# Patient Record
Sex: Male | Born: 1970 | Race: Black or African American | Hispanic: No | Marital: Married | Smoking: Never smoker
Health system: Southern US, Community
[De-identification: ages and names within clinical notes are randomized; demographics above are authoritative.]

## PROBLEM LIST (undated history)

## (undated) DIAGNOSIS — Z8719 Personal history of other diseases of the digestive system: Secondary | ICD-10-CM

## (undated) DIAGNOSIS — R7989 Other specified abnormal findings of blood chemistry: Secondary | ICD-10-CM

## (undated) DIAGNOSIS — G4710 Hypersomnia, unspecified: Secondary | ICD-10-CM

## (undated) DIAGNOSIS — M1990 Unspecified osteoarthritis, unspecified site: Secondary | ICD-10-CM

## (undated) DIAGNOSIS — Z8639 Personal history of other endocrine, nutritional and metabolic disease: Secondary | ICD-10-CM

## (undated) HISTORY — PX: SHOULDER SURGERY: SHX246

## (undated) HISTORY — PX: KNEE SURGERY: SHX244

---

## 1999-08-21 DIAGNOSIS — S4992XA Unspecified injury of left shoulder and upper arm, initial encounter: Secondary | ICD-10-CM

## 1999-08-21 HISTORY — DX: Unspecified injury of left shoulder and upper arm, initial encounter: S49.92XA

## 1999-12-06 ENCOUNTER — Emergency Department (HOSPITAL_COMMUNITY): Admission: EM | Admit: 1999-12-06 | Discharge: 1999-12-06 | Payer: Self-pay | Admitting: Emergency Medicine

## 1999-12-07 ENCOUNTER — Encounter: Payer: Self-pay | Admitting: Emergency Medicine

## 2000-08-07 ENCOUNTER — Ambulatory Visit (HOSPITAL_BASED_OUTPATIENT_CLINIC_OR_DEPARTMENT_OTHER): Admission: RE | Admit: 2000-08-07 | Discharge: 2000-08-07 | Payer: Self-pay | Admitting: Orthopedic Surgery

## 2003-10-15 ENCOUNTER — Ambulatory Visit (HOSPITAL_COMMUNITY): Admission: RE | Admit: 2003-10-15 | Discharge: 2003-10-15 | Payer: Self-pay | Admitting: Family Medicine

## 2006-12-05 ENCOUNTER — Emergency Department (HOSPITAL_COMMUNITY): Admission: EM | Admit: 2006-12-05 | Discharge: 2006-12-05 | Payer: Self-pay | Admitting: Emergency Medicine

## 2007-09-08 ENCOUNTER — Encounter: Admission: RE | Admit: 2007-09-08 | Discharge: 2007-09-08 | Payer: Self-pay | Admitting: Gastroenterology

## 2008-04-16 ENCOUNTER — Emergency Department (HOSPITAL_COMMUNITY): Admission: EM | Admit: 2008-04-16 | Discharge: 2008-04-16 | Payer: Self-pay | Admitting: Emergency Medicine

## 2011-01-05 NOTE — Op Note (Signed)
[place]  Patient:    Roger, Pace                    MRN: [id number] Proc. Date: 08/07/00 Adm. Date:  [id number] Attending:  Colbert Ewing                           Operative Report  PREOPERATIVE DIAGNOSIS:  Anterior dislocation with persistent instability, left shoulder.  POSTOPERATIVE DIAGNOSIS:  Anterior dislocation with persistent instability, left shoulder with capsular stretch injury.  Osteochondral injury of posterior humeral head with numerous intra-articular chondral loose bodies and an osteochondral loose body retrieved from the biceps tendon sheath.  Tearing anterior labrum.  OPERATION PERFORMED:  Left shoulder examination under anesthesia, arthroscopy with chondroplasty, humeral head.  Removal of numerous chondral loose bodies, debridement of labrum and assessment of pathology.  Open anteroinferior capsular shift to restabilize the shoulder.  Retrieval of osteochondral loose body from biceps tendon sheath.  SURGEON:  Loreta Ave, M.D.  ASSISTANT:  Arlys John D. Petrarca, P.A.-C.  ANESTHESIA:  General.  ESTIMATED BLOOD LOSS:  Minimal.  SPECIMENS:  None.  CULTURES:  None.  COMPLICATIONS:  None.  DRESSING:  Soft compressive.  DESCRIPTION OF PROCEDURE:  The patient was brought to the operating room and placed on the operating table in supine position.  After adequate anesthesia had been obtained, left shoulder examined.  Some gritting and crepitus with motion.  Increased external rotation and documented anterior instability. Stable posteriorly.  Full passive motion.  Placed in a beach chair position on the shoulder positioners, prepped and draped in the usual sterile fashion. Anterior posterior arthroscopic portals.  Shoulder entered with blunt obturator, distended and inspected.  Evidence of an anterior dislocation pattern with an osteochondral injury, posterior humeral head greater than 1 cm in diameter  with marked flaps of chondral fragmentation and chondral loose bodies found throughout the shoulder.  All removed.  Margins of the chondral injury debrided to a stable surface.  Tearing anterior labrum, middle portion. This was debrided to a stable surface.  No significant Bankart lesion but a capsular stretch injury was present.  Rotator cuff, biceps tendon, biceps anchor intact.  Once pathology had been confirmed, loose bodies removed and chrondroplasty complete.  Instruments and fluid removed.  Anterior incision from coracoid to anterior axillary fold.  Deltopectoral interval bluntly opened and the mini-Charnley retractor put in place.  The biceps tendon sheath was opened just inferior to the humeral head and within the tendon sheath the loose body that had been seen on scan was retrieved and removed and was osteochondral.  The subscap was then taken down and retracted medially leaving a small portion of the subscap on the front of the capsule to further thicken it.  The capsule was then cut along the humeral attachment from 12 to 6 oclock.  Interval tearing at the top was repaired with nonabsorbable Ethibond.  The capsule was then cut from the midportion of the humerus over to the glenoid.  Capsulorrhaphy performed bringing the inferior leaflet up to the 12 oclock position and the superior leaflet down to the 6 oclock position and then suturing this in place with multiple nonabsorbable sutures.  Markedly improved stability but maintained good motion of the shoulder.  Subscap was repaired anatomically with #2 Ethibond.  Wound irrigated.  Deltopectoral interval left closed.  Incision closed with subcutaneous and subcuticular Vicryl.  Steri-Strips applied.  Margins of the wound  injected with Marcaine. Portals closed with nylon and injected with Marcaine.  Sterile compressive dressing and shoulder immobilizer applied.  Anesthesia reversed.  Brought to recovery room.  Tolerated surgery well.   No complications. DD:  08/07/00 TD:  08/08/00 Job: [id number]

## 2012-10-09 ENCOUNTER — Other Ambulatory Visit: Payer: Self-pay | Admitting: Family Medicine

## 2012-10-09 DIAGNOSIS — N63 Unspecified lump in unspecified breast: Secondary | ICD-10-CM

## 2012-10-16 ENCOUNTER — Ambulatory Visit
Admission: RE | Admit: 2012-10-16 | Discharge: 2012-10-16 | Disposition: A | Discharge status: Home / Self Care | Payer: 59 | Source: Ambulatory Visit | Attending: Family Medicine | Admitting: Family Medicine

## 2012-10-16 DIAGNOSIS — N63 Unspecified lump in unspecified breast: Secondary | ICD-10-CM

## 2012-11-07 ENCOUNTER — Ambulatory Visit (HOSPITAL_BASED_OUTPATIENT_CLINIC_OR_DEPARTMENT_OTHER): Payer: 59 | Attending: Family Medicine

## 2012-11-07 VITALS — Ht 71.75 in | Wt 257.0 lb

## 2012-11-07 DIAGNOSIS — R0989 Other specified symptoms and signs involving the circulatory and respiratory systems: Secondary | ICD-10-CM | POA: Insufficient documentation

## 2012-11-07 DIAGNOSIS — R0609 Other forms of dyspnea: Secondary | ICD-10-CM | POA: Insufficient documentation

## 2012-11-07 DIAGNOSIS — G4730 Sleep apnea, unspecified: Secondary | ICD-10-CM | POA: Insufficient documentation

## 2012-11-07 DIAGNOSIS — G4710 Hypersomnia, unspecified: Secondary | ICD-10-CM | POA: Insufficient documentation

## 2012-11-07 DIAGNOSIS — G4733 Obstructive sleep apnea (adult) (pediatric): Secondary | ICD-10-CM

## 2012-11-15 DIAGNOSIS — R0609 Other forms of dyspnea: Secondary | ICD-10-CM

## 2012-11-15 DIAGNOSIS — G4730 Sleep apnea, unspecified: Secondary | ICD-10-CM

## 2012-11-15 DIAGNOSIS — R0989 Other specified symptoms and signs involving the circulatory and respiratory systems: Secondary | ICD-10-CM

## 2012-11-15 DIAGNOSIS — G4710 Hypersomnia, unspecified: Secondary | ICD-10-CM

## 2012-11-15 NOTE — Procedures (Signed)
Roger Pace, Roger Pace             ACCOUNT NO.:  [id number]  MEDICAL RECORD NO.:  [id number]          PATIENT TYPE:  OUT  LOCATION:  SLEEP CENTER                 FACILITY:  [facility]  PHYSICIAN:  Arnav Cregg D. Maple Hudson, MD, FCCP, FACPDATE OF BIRTH:  03-Sep-1969  DATE OF STUDY:  11/07/2012                           NOCTURNAL POLYSOMNOGRAM  REFERRING PHYSICIAN:  Lillia Carmel, M.D.  INDICATION FOR STUDY:  Hypersomnia with sleep apnea.  EPWORTH SLEEPINESS SCORE:  14/24.  BMI 34.9, weight 255 pounds, height 71.7 inches, neck 19.5 inches.  MEDICATIONS:  Home medications charted and reviewed.  SLEEP ARCHITECTURE:  Total sleep time 355 minutes with sleep efficiency 95%.  Stage I 2.7%, stage II 74.8%, stage III 7.5%, REM 15.1% of total sleep time.  Sleep latency 3.5 minutes, REM latency 79 minutes.  Awake after sleep onset 15.5 minutes.  Arousal index 8.6.  BEDTIME MEDICATION:  None.  RESPIRATORY DATA:  Apnea-hypopnea index (AHI) 1 per hour.  A total of 6 events was scored, all was hypopneas associated with supine sleep position and REM.  REM AHI 5.6 per hour.  OXYGEN DATA:  Moderate snoring with oxygen desaturation to a nadir of 89% and mean oxygen saturation through the study of 93.6% on room air.  CARDIAC DATA:  Normal sinus rhythm.  MOVEMENT-PARASOMNIA:  No significant movement disturbance.  No bathroom trips.  IMPRESSIONS-RECOMMENDATIONS: 1. Unremarkable sleep architecture without medication. 2. Occasional respiratory event with sleep disturbance, within normal     limits.  AHI 1 per hour (the normal range for adults is from 0-5     events per hour).  Moderate snoring with oxygen desaturation to a     nadir of 89% and mean oxygen saturation through the study of 93.6%     on room air. 3. He describes a rotating shift requirement and he estimates it costs     him 2 days sleep per week.  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     Jaeden Messer D. Maple Hudson, MD,  [facility], FACP Diplomate, American Board of Sleep Medicine    CDY/MEDQ  D:  11/15/2012 09:35:45  T:  11/15/2012 10:20:21  Job:  [id number]

## 2013-06-10 ENCOUNTER — Other Ambulatory Visit: Payer: Self-pay | Admitting: Orthopaedic Surgery

## 2013-06-10 DIAGNOSIS — M542 Cervicalgia: Secondary | ICD-10-CM

## 2013-06-10 DIAGNOSIS — R531 Weakness: Secondary | ICD-10-CM

## 2013-06-19 ENCOUNTER — Ambulatory Visit
Admission: RE | Admit: 2013-06-19 | Discharge: 2013-06-19 | Disposition: A | Discharge status: Home / Self Care | Payer: 59 | Source: Ambulatory Visit | Attending: Orthopaedic Surgery | Admitting: Orthopaedic Surgery

## 2013-06-19 DIAGNOSIS — M542 Cervicalgia: Secondary | ICD-10-CM

## 2013-06-19 DIAGNOSIS — R531 Weakness: Secondary | ICD-10-CM

## 2014-05-13 ENCOUNTER — Ambulatory Visit
Admission: RE | Admit: 2014-05-13 | Discharge: 2014-05-13 | Disposition: A | Discharge status: Home / Self Care | Payer: Worker's Compensation | Source: Ambulatory Visit | Attending: Occupational Medicine | Admitting: Occupational Medicine

## 2014-05-13 ENCOUNTER — Other Ambulatory Visit: Payer: Self-pay | Admitting: Occupational Medicine

## 2014-05-13 DIAGNOSIS — M25521 Pain in right elbow: Secondary | ICD-10-CM

## 2014-05-13 DIAGNOSIS — T1490XA Injury, unspecified, initial encounter: Secondary | ICD-10-CM

## 2014-07-29 ENCOUNTER — Ambulatory Visit: Payer: 59 | Attending: Physician Assistant | Admitting: Physical Therapy

## 2014-07-29 DIAGNOSIS — M7652 Patellar tendinitis, left knee: Secondary | ICD-10-CM | POA: Diagnosis not present

## 2014-07-29 DIAGNOSIS — M7651 Patellar tendinitis, right knee: Secondary | ICD-10-CM | POA: Insufficient documentation

## 2014-07-30 ENCOUNTER — Encounter: Payer: Self-pay | Admitting: Physical Therapy

## 2014-07-30 NOTE — Therapy (Signed)
[facility] [address] [place], [place], [address] Phone: [phone]   Fax:  [phone]  Physical Therapy Evaluation  Patient Details  Name: Roger NuttingJulius A Prew MRN: [id number] Date of Birth: 05/27/1971  Encounter Date: 07/29/2014      PT End of Session - 07/30/14 1206    Visit Number 1   Number of Visits 8   Date for PT Re-Evaluation 09/09/14   PT Start Time 1512   PT Stop Time 1550   PT Time Calculation (min) 38 min      History reviewed. No pertinent past medical history.  History reviewed. No pertinent past surgical history.  There were no vitals taken for this visit.  Visit Diagnosis:  Patellar tendinitis of both knees - Plan: PT plan of care cert/re-cert      Subjective Assessment - 07/29/14 1512    Symptoms Taking Celebrex.  Complains of bilateral knee pain.   No recent injury.  States his problem is from wear and tear from high school football.  Reports the problem is worsening over time.  Went to the doctor to get a cortisone shot but the doctor did not want to do that "near his tendon" and referred him to PT instead for "patellar femoral tendinitis".   Right knee pain located distal thigh as well as peripatellar region and patellar tendon region.  Left knee pain located distal thigh as well but primarily medial joint line.   Pertinent History Right ACL reconstruction 1989, torn medial and lateral meniscus;  Left MCL tear and medial meniscus1988;  ruptured cervical disc;  left shoulder capsular shift 14 years.     Limitations Standing;Walking   Diagnostic tests Severe OA right knee; moderate OA left knee   Currently in Pain? 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  --  no   Is the patient reluctant to leave their home because of a fear of falling?  No   Home Environment   Living Enviornment Private residence   Prior Function   Vocation Full time employment   Vocation Requirements Works at [employer];  in/out of the Engineer, productioncar  Police officer;  Teaches at [employer]   Leisure --  Wants to get back to the gym   Observation/Other Assessments   Observations --  Palpable crepitus bilaterally;  left genu valgus   Skin Integrity mild  right knee swelling   Focus on Therapeutic Outcomes (FOTO)  --  not captured   AROM   Right Knee Extension 4   Right Knee Flexion 134   Left Knee Extension 0   Left Knee Flexion 134   Strength   Right Hip ABduction 4/5   Left Hip ABduction 3+/5   Right Knee Flexion 4/5   Right Knee Extension 4/5   Left Knee Flexion 4/5   Left Knee Extension 4/5   Flexibility   Soft Tissue Assessment /Muscle Lenght --  HS length bilaterally 80 degrees   Special Tests    Special Tests --  Decreased patellar mobility in all directions right > left   Step-up/Step Down   Side  Right;Left   Comments Pain and compensatory trunk lean and rotation;  discontinued right after 4 reps, left 7 reps              PT Short Term Goals - 07/30/14 1223    PT  SHORT TERM GOAL #1   Title "Independent with initial HEP   Time 3   Period Weeks   Status New   PT SHORT TERM GOAL #2   Title Knee flexion improved to 136 degrees bilaterally to provide greater ease getting in/out of the car.   Time 3   Period Weeks   Status New          PT Long Term Goals - 07/30/14 1224    PT LONG TERM GOAL #1   Title "Pt will be independent with advanced HEP.    Time 6   Period Weeks   Status New   PT LONG TERM GOAL #2   Title LEFS score improved by 5 points indicating improved function with less pain.   Time 6   Period Weeks   Status New   PT LONG TERM GOAL #3   Title Patient will have left hip abduction strength improved to 4/5 for improved standing and walking tolerance for work tasks.     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  Previous surgeries on both knee.  He reports he has been diagnosed with "severe OA" in right knee and "moderate " in the left knee .  He reports a worsening of pain over time especially in the quad muscle just above his kneecaps as well as around his kneecaps and left medial joint line.  He previously did some body building but has not worked out in [employer] gym since a cervical disc injury last year.  He hopes PT will help him return to the gym.  Bilateral knee crepitus.  Right knee AROM:  4-134, left 0-134.  Very painful with 6 inch step downs with multiple compensatory movements.  Bilateral LE strength grossly 4/5 except left hip abd 3+/5.     Pt will benefit from skilled therapeutic intervention in order to improve on the following deficits Pain;Decreased range of  motion;Decreased strength   Rehab Potential Good   PT Frequency 1x / week  PT Duration 6 weeks   PT Treatment/Interventions Electrical Stimulation;Therapeutic exercise;Neuromuscular re-education;Manual techniques;Dry needling;Patient/family education   PT Next Visit Plan LEFS since FOTO not captured;Bike or Nu-Step;  HS stretch;  patellar mobilization;  Leg press within pain limits;  glut med strengthening (clams with resist); quad stengthening within pain limits; ice or vasocompression                              Problem List There are no active problems to display for this patient.   Lavinia Sharps C 07/30/2014, 12:32 PM   Lavinia Sharps, PT 07/30/2014 12:33 PM Phone: [phone] Fax: [phone]

## 2014-08-05 ENCOUNTER — Ambulatory Visit: Payer: 59 | Admitting: Physical Therapy

## 2014-08-05 DIAGNOSIS — M7652 Patellar tendinitis, left knee: Principal | ICD-10-CM

## 2014-08-05 DIAGNOSIS — M7651 Patellar tendinitis, right knee: Secondary | ICD-10-CM

## 2014-08-05 NOTE — Therapy (Signed)
[facility] [address] [place], [place], [address] Phone: [phone]   Fax:  [phone]  Physical Therapy Treatment  Patient Details  Name: Roger Pace MRN: [id number] Date of Birth: 12/16/1970  Encounter Date: 08/05/2014      PT End of Session - 08/05/14 1311    Visit Number 2   Number of Visits 8   Date for PT Re-Evaluation 09/09/14   PT Start Time 1157   PT Stop Time 1235   PT Time Calculation (min) 38 min   Activity Tolerance Patient tolerated treatment well;Patient limited by pain      No past medical history on file.  No past surgical history on file.  There were no vitals taken for this visit.  Visit Diagnosis:  Patellar tendinitis of both knees      Subjective Assessment - 08/05/14 1202    Symptoms sore with season change. distal quads both   Currently in Pain? Yes   Pain Location Knee   Pain Orientation Right;Left   Pain Descriptors / Indicators Nagging   Pain Type Chronic pain   Pain Onset More than a month ago   Aggravating Factors  steps, change of weather   Pain Relieving Factors frozen peas   Multiple Pain Sites Yes   Pain Location Knee   Pain Orientation Left   Pain Descriptors / Indicators Aching   Pain Frequency Constant   Pain Onset On-going            [facility] Adult PT Treatment/Exercise - 08/05/14 1200    Knee/Hip Exercises: Stretches   Passive Hamstring Stretch 1 rep;30 seconds  with sheet, WNL   Quad Stretch 1 rep  Rt over edge of bed with manual stretches   Knee: Self-Stretch to increase Flexion --  Range Rt flexion 125, Lt 146 degrees   ITB Stretch 1 rep;10 seconds  both   Gastroc Stretch 1 rep;30 seconds  both on step   Knee/Hip Exercises: Standing   Heel Raises 2 sets  each leg   Step Down Left;10 reps;Right;5 reps  stopped Rt due to rubbing sound.   SLS with Vectors --  single leg each red band 4 way, standing knee20 degrees   Other Standing Knee Exercises --  hip  hinge 7 reps rod used for posture, painful Rt   Knee/Hip Exercises: Supine   Quad Sets 1 set;Both   Straight Leg Raises 1 set;Both  5  second holds , with cues   Patellar Mobs --  Rt only   Knee/Hip Exercises: Sidelying   Clams --  black band 10 reps encouraged hip position away from ribs.    Manual Therapy   Other Manual Therapy --  patellar mobilization rt            PT Short Term Goals - 08/05/14 1318    PT SHORT TERM GOAL #2   Title Knee flexion improved to 136 degrees bilaterally to provide greater ease getting in/out of the car.   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     Time 6   Period Weeks   Status On-going          Plan - 08/05/14 1313    Clinical Impression Statement Patient  flexible, tolerated exercises well considering all he has going on.                               Problem List There are no active problems to display for this patient. Roger Pace, PTA 08/05/2014 1:20 PM Phone: [phone] Fax: [phone]   Roger Pace,Roger Pace 08/05/2014, 1:20 PM

## 2014-08-10 ENCOUNTER — Ambulatory Visit: Payer: 59 | Admitting: Physical Therapy

## 2014-08-10 DIAGNOSIS — M7651 Patellar tendinitis, right knee: Secondary | ICD-10-CM

## 2014-08-10 DIAGNOSIS — M7652 Patellar tendinitis, left knee: Secondary | ICD-10-CM | POA: Diagnosis not present

## 2014-08-10 NOTE — Therapy (Signed)
[place], [place], [address] Phone: [phone]   Fax:  [phone]  Physical Therapy Treatment  Patient Details  Name: Roger Pace MRN: [id number] Date of Birth: Oct 18, 1970  Encounter Date: 08/10/2014      PT End of Session - 08/10/14 1650    Visit Number 3   Number of Visits 8   Date for PT Re-Evaluation 09/09/14   PT Start Time 4174   PT Stop Time 1650   PT Time Calculation (min) 65 min   Activity Tolerance Patient tolerated treatment well      No past medical history on file.  No past surgical history on file.  There were no vitals taken for this visit.  Visit Diagnosis:  Patellar tendinitis of both knees - Plan: PT plan of care cert/re-cert      Subjective Assessment - 08/10/14 1548    Symptoms Sore after getting out of a low car all weekend.  Step down and squat were painful last visit.     Limitations Standing;Walking   Currently in Pain? Yes   Pain Score 3    Pain Location Knee   Pain Orientation Left;Right   Pain Type Chronic pain   Pain Onset More than a month ago   Aggravating Factors  steps, walking on a hard floor at work   Pain Relieving Factors off feet                    [facility] Adult PT Treatment/Exercise - 08/10/14 1643    Knee/Hip Exercises: Machines for Strengthening   Total Gym Leg Press --  40# bilateral 20x, 20# right/left 20x each   Knee/Hip Exercises: Standing   Lateral Step Up Limitations --  4 inch lateral step up with opposite hip abd 20 right/left   SLS --  with red band UE diagonals 20x right and left   Moist Heat Therapy   Number Minutes Moist Heat 10 Minutes   Moist Heat Location --  Bilateral thighs/knees   Manual Therapy   Manual Therapy Myofascial release   Myofascial Release --  Soft tissue mob before and after dry needling VMO and VL B          Trigger Point Dry Needling - 08/10/14 1645    Consent Given? Yes   Education Handout Provided Yes   Muscles Treated Lower Body Quadriceps   Quadriceps Response Twitch response elicited              PT Education - 08/10/14 1650    Education provided Yes   Education Details dry needle info   Person(s) Educated Patient   Methods Explanation   Comprehension Verbalized understanding          PT Short Term Goals - 08/10/14 1642    PT SHORT TERM GOAL #1   Title "Independent with initial HEP   Time 3   Period Weeks   Status On-going   PT SHORT TERM GOAL #2   Title Knee flexion improved to 136 degrees bilaterally to provide greater ease getting in/out of the car.   Time 3   Period Weeks           PT Long Term Goals - 08/10/14 1641    PT LONG TERM GOAL #1   Title "Pt will be independent with advanced HEP.    Time 6   Period Weeks   Status On-going   PT LONG TERM GOAL #2   Title LEFS  score improved by 5 points indicating improved function with less pain.   Time 6   Period Weeks   Status On-going   PT LONG TERM GOAL #3   Title Patient will have left hip abduction strength improved to 4/5 for improved standing and walking tolerance for work tasks.     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     Time 6   Period Weeks   Status On-going               Plan - 08/10/14 1652    Clinical Impression Statement Therapist closely monitoring exercises and modifying as needed to keep pain at a minimal.  No goals met yet secondary to only 3rd visit.  Will request ionto order.   PT Next Visit Plan Request ionto order today, perform next visit if order signed;  assess response to dry needling;  strengthening within pain limits        Problem List There are no active problems to display for this patient.   Alvera Singh 08/10/2014, 5:02 PM  [facility] [address] [place], [place], [address] Phone: [phone]   Fax:  [phone]

## 2014-08-10 NOTE — Patient Instructions (Signed)

## 2014-08-17 ENCOUNTER — Ambulatory Visit: Payer: 59 | Admitting: Rehabilitation

## 2014-08-17 DIAGNOSIS — M7652 Patellar tendinitis, left knee: Principal | ICD-10-CM

## 2014-08-17 DIAGNOSIS — M7651 Patellar tendinitis, right knee: Secondary | ICD-10-CM

## 2014-08-17 NOTE — Patient Instructions (Signed)
Back Wall Slide   With feet ____ inches from wall, lean as much of back against the wall as possible. Gently squat down ___ inches, keeping back against wall. Hold __5__ seconds while counting out loud. Repeat __10-20__ times. Do __2__ sessions per day.  http://gt2.exer.us/563   Copyright  VHI. All rights reserved.   Abduction: Side Leg Lift (Eccentric) - Side-Lying   Lie on side. Lift top leg slightly higher than shoulder level. Keep top leg straight with body, toes pointing forward. Slowly lower for 3-5 seconds. __10_ reps per set, 2___ sets per day, _7__ days per week. Add ___ lbs when you achieve ___ repetitions.  Copyright  VHI. All rights reserved.  Strengthening: Straight Leg Raise (Phase 1)   Tighten muscles on front of right thigh, then lift leg __12__ inches from surface, keeping knee locked.  Repeat __10-20__ times per set. Do __2__ sets per session. Do __2__ sessions per day.  http://orth.exer.us/614   Copyright  VHI. All rights reserved.

## 2014-08-17 NOTE — Therapy (Signed)
[facility] [address] [place], [place], [address] Phone: [phone]   Fax:  [phone]  Physical Therapy Treatment  Patient Details  Name: Roger NuttingJulius A Pace MRN: [id number] Date of Birth: 05/13/1971  Encounter Date: 08/17/2014      PT End of Session - 08/17/14 1555    Visit Number 4   Number of Visits 8   Date for PT Re-Evaluation 09/09/14   PT Start Time 0305   PT Stop Time 0348   PT Time Calculation (min) 43 min      No past medical history on file.  No past surgical history on file.  There were no vitals taken for this visit.  Visit Diagnosis:  Patellar tendinitis of both knees      Subjective Assessment - 08/17/14 1531    Symptoms no pain now but have not been doing anything. Go back to work [employer]. I did not notice a change in pain after dry needing maybe becasue I worked all night afterwards.    Pertinent History Right ACL reconstruction 1989, torn medial and lateral meniscus;  Left MCL tear and medial meniscus1988;  ruptured cervical disc;  left shoulder capsular shift 14 years.     Currently in Pain? No/denies   Aggravating Factors  deep knee flexion, steps,    Pain Relieving Factors off feet   Multiple Pain Sites No                    [facility] Adult PT Treatment/Exercise - 08/17/14 1539    Knee/Hip Exercises: Stretches   Quad Stretch 3 reps;30 seconds   Gastroc Stretch 30 seconds;3 reps  heel hang   Knee/Hip Exercises: Standing   Wall Squat 10 reps;2 sets   Knee/Hip Exercises: Supine   Straight Leg Raises 2 sets;10 reps;Both;Strengthening  3#                PT Education - 08/17/14 1557    Education provided Yes   Education Details HEP   Person(s) Educated Patient   Methods Handout;Explanation   Comprehension Verbalized understanding          PT Short Term Goals - 08/10/14 1642    PT SHORT TERM GOAL #1   Title "Independent with initial HEP   Time 3   Period Weeks    Status On-going   PT SHORT TERM GOAL #2   Title Knee flexion improved to 136 degrees bilaterally to provide greater ease getting in/out of the car.   Time 3   Period Weeks           PT Long Term Goals - 08/10/14 1641    PT LONG TERM GOAL #1   Title "Pt will be independent with advanced HEP.    Time 6   Period Weeks   Status On-going   PT LONG TERM GOAL #2   Title LEFS score improved by 5 points indicating improved function with less pain.   Time 6   Period Weeks   Status On-going   PT LONG TERM GOAL #3   Title Patient will have left hip abduction strength improved to 4/5 for improved standing and walking tolerance for work tasks.     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     Time 6   Period Weeks  Status On-going               Plan - 08/17/14 1558    Clinical Impression Statement able to initiate hip strengthening HEP   PT Next Visit Plan call to ask about ionto, POC or ionto request not signed yet        Problem List There are no active problems to display for this patient.   Sherrie MustacheDonoho, Mylan Schwarz McGee, VirginiaPTA 08/17/2014, 4:02 PM  [facility] [address] [place], [place], [address] Phone: [phone]   Fax:  [phone]

## 2014-08-24 ENCOUNTER — Ambulatory Visit: Payer: 59 | Admitting: Rehabilitation

## 2014-09-02 ENCOUNTER — Encounter: Payer: Self-pay | Admitting: Physical Therapy

## 2014-12-09 DIAGNOSIS — S29011A Strain of muscle and tendon of front wall of thorax, initial encounter: Secondary | ICD-10-CM | POA: Insufficient documentation

## 2016-08-20 DIAGNOSIS — Z8781 Personal history of (healed) traumatic fracture: Secondary | ICD-10-CM

## 2016-08-20 HISTORY — DX: Personal history of (healed) traumatic fracture: Z87.81

## 2016-10-10 ENCOUNTER — Other Ambulatory Visit (INDEPENDENT_AMBULATORY_CARE_PROVIDER_SITE_OTHER): Payer: Self-pay | Admitting: Physician Assistant

## 2016-10-19 ENCOUNTER — Emergency Department (HOSPITAL_BASED_OUTPATIENT_CLINIC_OR_DEPARTMENT_OTHER)
Admission: EM | Admit: 2016-10-19 | Discharge: 2016-10-19 | Disposition: A | Discharge status: Home / Self Care | Payer: Worker's Compensation | Attending: Emergency Medicine | Admitting: Emergency Medicine

## 2016-10-19 ENCOUNTER — Emergency Department (HOSPITAL_BASED_OUTPATIENT_CLINIC_OR_DEPARTMENT_OTHER): Payer: Worker's Compensation

## 2016-10-19 ENCOUNTER — Encounter (HOSPITAL_BASED_OUTPATIENT_CLINIC_OR_DEPARTMENT_OTHER): Payer: Self-pay | Admitting: Emergency Medicine

## 2016-10-19 DIAGNOSIS — Y999 Unspecified external cause status: Secondary | ICD-10-CM | POA: Insufficient documentation

## 2016-10-19 DIAGNOSIS — X501XXA Overexertion from prolonged static or awkward postures, initial encounter: Secondary | ICD-10-CM | POA: Diagnosis not present

## 2016-10-19 DIAGNOSIS — S99921A Unspecified injury of right foot, initial encounter: Secondary | ICD-10-CM | POA: Diagnosis present

## 2016-10-19 DIAGNOSIS — Y939 Activity, unspecified: Secondary | ICD-10-CM | POA: Insufficient documentation

## 2016-10-19 DIAGNOSIS — Y929 Unspecified place or not applicable: Secondary | ICD-10-CM | POA: Insufficient documentation

## 2016-10-19 DIAGNOSIS — S92344A Nondisplaced fracture of fourth metatarsal bone, right foot, initial encounter for closed fracture: Secondary | ICD-10-CM | POA: Diagnosis not present

## 2016-10-19 NOTE — ED Notes (Signed)
Pt verbalizes understanding of d/c instructions and denies any further needs at this time. 

## 2016-10-19 NOTE — ED Triage Notes (Signed)
Patient states that he tripped and twisted his right ankle and now has right foot pain

## 2016-10-19 NOTE — ED Provider Notes (Signed)
[facility] Provider Note   CSN: [id number] Arrival date & time: 10/19/16  2104  By signing my name below, I, Modena JanskyAlbert Thayil, attest that this documentation has been prepared under the direction and in the presence of non-physician practitioner, Roxy Horsemanobert Hyla Coard, PA-C. Electronically Signed: Modena JanskyAlbert Thayil, Scribe. 10/19/2016. 10:51 PM.  History   Chief Complaint Chief Complaint  Patient presents with  . Fall   The history is provided by the patient. No language interpreter was used.   HPI Comments: Roger Pace is a 46 y.o. male who presents to the Emergency Department complaining of a fall that occurred today. He states he stepped in a hole and twisted his right ankle. He denies any LOC or head injury. He reports associated constant moderate right foot pain that is exacerbated by movement. He denies any other complaints.   History reviewed. No pertinent past medical history.  There are no active problems to display for this patient.   Past Surgical History:  Procedure Laterality Date  . KNEE SURGERY    . SHOULDER SURGERY         Home Medications    Prior to Admission medications   Medication Sig Start Date End Date Taking? Authorizing Provider  celecoxib (CELEBREX) 200 MG capsule TAKE 1 TO 2 CAPSULES BY MOUTH DAILY AS NEEDED 10/10/16   Kirtland BouchardGilbert W Clark, PA-C    Family History History reviewed. No pertinent family history.  Social History Social History  Substance Use Topics  . Smoking status: Never Smoker  . Smokeless tobacco: Never Used  . Alcohol use No     Allergies   Shellfish allergy   Review of Systems Review of Systems  Musculoskeletal: Positive for arthralgias and myalgias.  Neurological: Negative for syncope.     Physical Exam Updated Vital Signs BP (!) 172/102 (BP Location: Right Arm)   Pulse 62   Temp 97.8 F (36.6 C) (Oral)   Resp 16   Ht 5\' 11"  (1.803 m)   Wt 275 lb (124.7 kg)   SpO2 100%   BMI 38.35 kg/m   Physical  Exam  Nursing note and vitals reviewed.  Constitutional: Pt appears well-developed and well-nourished. No distress.  HENT:  Head: Normocephalic and atraumatic.  Eyes: Conjunctivae are normal.  Neck: Normal range of motion.  Cardiovascular: Normal rate, regular rhythm. Intact distal pulses.   Capillary refill < 3 sec.  Pulmonary/Chest: Effort normal and breath sounds normal.  Musculoskeletal:  Right Foot Pt exhibits tenderness to palpation over the midfoot, no ankle tenderness.   ROM: 5/5  Strength: 5/5  Neurological: Pt  is alert. Coordination normal.  Sensation: 5/5 Skin: Skin is warm and dry. Pt is not diaphoretic.  No evidence of open wound or skin tenting Psychiatric: Pt has a normal mood and affect.      ED Treatments / Results  DIAGNOSTIC STUDIES: Oxygen Saturation is 100% on RA, normal by my interpretation.    COORDINATION OF CARE: 10:55 PM- Pt advised of plan for treatment and pt agrees.  Labs (all labs ordered are listed, but only abnormal results are displayed) Labs Reviewed - No data to display  EKG  EKG Interpretation None       Radiology Dg Foot Complete Right  Result Date: 10/19/2016 CLINICAL DATA:  Stepped on downed branch and rolled right foot, with right foot pain and gait change. Initial encounter. EXAM: RIGHT FOOT COMPLETE - 3+ VIEW COMPARISON:  None. FINDINGS: There is a nondisplaced fracture through the proximal to mid fourth  metatarsal. No additional fractures are seen. A bipartite medial sesamoid of the first toe is noted. The joint spaces are preserved. There is no evidence of talar subluxation; the subtalar joint is unremarkable in appearance. No significant soft tissue abnormalities are seen. IMPRESSION: 1. Nondisplaced fracture through the proximal to mid fourth metatarsal. 2. Bipartite medial sesamoid of the first toe. Electronically Signed   By: Roanna Raider M.D.   On: 10/19/2016 22:05    Procedures Procedures (including critical care  time)  Medications Ordered in ED Medications - No data to display   Initial Impression / Assessment and Plan / ED Course  I have reviewed the triage vital signs and the nursing notes.  Pertinent labs & imaging results that were available during my care of the patient were reviewed by me and considered in my medical decision making (see chart for details).      Patient X-Ray as above. Pt advised to follow up with orthopedics if symptoms persist for possibility of missed fracture diagnosis. Patient given post-op shoe while in ED, conservative therapy recommended and discussed. Patient will be dc home & is agreeable with above plan.   Final Clinical Impressions(s) / ED Diagnoses   Final diagnoses:  Closed nondisplaced fracture of fourth metatarsal bone of right foot, initial encounter    New Prescriptions Discharge Medication List as of 10/19/2016 11:03 PM     I personally performed the services described in this documentation, which was scribed in my presence. The recorded information has been reviewed and is accurate.       Roxy Horseman, PA-C 10/19/16 2353    Canary Brim Tegeler, MD 10/20/16 [phone]

## 2017-03-06 ENCOUNTER — Ambulatory Visit (INDEPENDENT_AMBULATORY_CARE_PROVIDER_SITE_OTHER): Payer: 59 | Admitting: Physician Assistant

## 2017-03-06 ENCOUNTER — Encounter (INDEPENDENT_AMBULATORY_CARE_PROVIDER_SITE_OTHER): Payer: Self-pay | Admitting: Physician Assistant

## 2017-03-06 DIAGNOSIS — M7662 Achilles tendinitis, left leg: Secondary | ICD-10-CM | POA: Diagnosis not present

## 2017-03-06 MED ORDER — NITROGLYCERIN 0.2 MG/HR TD PT24: 0.2000 mg | MEDICATED_PATCH | Freq: Every day | TRANSDERMAL | 1 refills | Status: DC

## 2017-03-06 NOTE — Progress Notes (Signed)
   Office Visit Note   Patient: Roger Pace           Date of Birth: 08/07/1971           MRN: [id number] Visit Date: 03/06/2017              Requested by: No referring provider defined for this encounter. PCP: Merri BrunetteSmith, Candace, MD   Assessment & Plan: Visit Diagnoses:  1. Achilles tendinitis of left lower extremity     Plan: Nitropatch is to the Achilles tendon. Gastrocsoleus stretching exercises discussed. 9/16 heel lift in the left shoe. We'll see him back in 6 weeks' check his progress lack of.  Follow-Up Instructions: Return in about 4 weeks (around 04/03/2017).   Orders:  No orders of the defined types were placed in this encounter.  Meds ordered this encounter  Medications  . nitroGLYCERIN (NITRODUR - DOSED IN MG/24 HR) 0.2 mg/hr patch    Sig: Place 1 patch (0.2 mg total) onto the skin daily. Apply one patch to achilles tendon daily. If patient is unable to tolerate full patch ok to use half patch to achilles tendon, changing location slightly daily.    Dispense:  30 patch    Refill:  1    Apply patch near the effected area, change location daily      Procedures: No procedures performed   Clinical Data: No additional findings.   Subjective: Chief Complaint  Patient presents with  . Left Achilles Tendon - Pain    HPI Roger Pace is a [age] sexual male comes in today with Achilles pain and swelling. He states that the swelling pain began after he had to walk differently due to a right fourth mallet tarsal fracture which she sustained in March. He still in a boot is being treated by another provider for the metatarsal fracture. He has a history of Achilles tendinitis and uses nitroglycerin patches in the past. He's had no known injury to the left Achilles. Review of Systems  See history of present illness otherwise negative  Objective: Vital Signs: There were no vitals taken for this visit.  Physical Exam  Constitutional: He is oriented to person, place, and  time. He appears well-developed and well-nourished. No distress.  Cardiovascular: Intact distal pulses.   Pulmonary/Chest: Effort normal.  Neurological: He is alert and oriented to person, place, and time.  Skin: He is not diaphoretic.  Psychiatric: He has a normal mood and affect. His behavior is normal.    Ortho Exam Dorsal pedal pulse intact. Left constant tests negative. He does have thickening of the mid Achilles tendon but no deficit is palpable. There is no erythema, rashes skin lesions ulcerations. He is nontender at the Achilles insertion. Tight gastroc. Good range of motion the ankle otherwise without pain. Specialty Comments:  No specialty comments available.  Imaging: No results found.   PMFS History: There are no active problems to display for this patient.  No past medical history on file.  No family history on file.  Past Surgical History:  Procedure Laterality Date  . KNEE SURGERY    . SHOULDER SURGERY     Social History   Occupational History  . Not on file.   Social History Main Topics  . Smoking status: Never Smoker  . Smokeless tobacco: Never Used  . Alcohol use No  . Drug use: No  . Sexual activity: Not on file

## 2017-03-31 ENCOUNTER — Other Ambulatory Visit (INDEPENDENT_AMBULATORY_CARE_PROVIDER_SITE_OTHER): Payer: Self-pay | Admitting: Physician Assistant

## 2017-04-01 NOTE — Telephone Encounter (Signed)
Please advise 

## 2017-04-15 ENCOUNTER — Ambulatory Visit (INDEPENDENT_AMBULATORY_CARE_PROVIDER_SITE_OTHER): Payer: 59 | Admitting: Physician Assistant

## 2017-07-03 ENCOUNTER — Telehealth (INDEPENDENT_AMBULATORY_CARE_PROVIDER_SITE_OTHER): Payer: Self-pay | Admitting: Physician Assistant

## 2017-07-03 NOTE — Telephone Encounter (Signed)
I have pt scheduled for 07/22/2017. Patient wants the gel injections and asked if it would be possible to get shots for both knees. So if you could order those for me please, thanks!

## 2017-07-04 NOTE — Telephone Encounter (Signed)
Yes

## 2017-07-04 NOTE — Telephone Encounter (Signed)
Shoulder we see him first or just order Monovisc?

## 2017-07-04 NOTE — Telephone Encounter (Signed)
Ordered

## 2017-07-22 ENCOUNTER — Encounter (INDEPENDENT_AMBULATORY_CARE_PROVIDER_SITE_OTHER): Payer: Self-pay | Admitting: Physician Assistant

## 2017-07-22 ENCOUNTER — Ambulatory Visit (INDEPENDENT_AMBULATORY_CARE_PROVIDER_SITE_OTHER): Payer: 59 | Admitting: Physician Assistant

## 2017-07-22 VITALS — Ht 71.75 in | Wt 285.0 lb

## 2017-07-22 DIAGNOSIS — M1712 Unilateral primary osteoarthritis, left knee: Secondary | ICD-10-CM

## 2017-07-22 DIAGNOSIS — M1711 Unilateral primary osteoarthritis, right knee: Secondary | ICD-10-CM

## 2017-07-22 DIAGNOSIS — M170 Bilateral primary osteoarthritis of knee: Secondary | ICD-10-CM

## 2017-07-22 MED ORDER — HYALURONAN 88 MG/4ML IX SOSY
88.0000 mg | PREFILLED_SYRINGE | INTRA_ARTICULAR | Status: AC | PRN
  Administered 2017-07-22: 88 mg via INTRA_ARTICULAR

## 2017-07-22 NOTE — Progress Notes (Signed)
   Procedure Note  Patient: Roger NuttingJulius A Pace             Date of Birth: 02/25/1971           MRN: [id number]             Visit Date: 07/22/2017 HPI: [name] comes in today for injections both knees.  He has  known osteoarthritis both knees.  Supplemental injections and is requesting injections in both knees today.  He takes Celebrex for his knee pain.  He had no known injury to either knee. Review of systems: See HPI otherwise negative Physical exam: Bilateral knees is good range of motion of both knees no effusion abnormal warmth or erythema.  No instability with valgus varus stressing. Procedures: Visit Diagnoses: Primary osteoarthritis of both knees  Large Joint Inj: bilateral knee on 07/22/2017 8:31 AM Indications: pain Details: 22 G 1.5 in needle, anterolateral approach  Arthrogram: No  Medications (Right): 88 mg Hyaluronan 88 MG/4ML Medications (Left): 88 mg Hyaluronan 88 MG/4ML Outcome: tolerated well, no immediate complications Procedure, treatment alternatives, risks and benefits explained, specific risks discussed. Consent was given by the patient. Immediately prior to procedure a time out was called to verify the correct patient, procedure, equipment, support staff and site/side marked as required. Patient was prepped and draped in the usual sterile fashion.     Plan: He will follow-up on an as-needed basis pain persist or becomes worse.  Questions encouraged and answered at length

## 2017-11-18 ENCOUNTER — Other Ambulatory Visit (INDEPENDENT_AMBULATORY_CARE_PROVIDER_SITE_OTHER): Payer: Self-pay | Admitting: Physician Assistant

## 2017-11-18 NOTE — Telephone Encounter (Signed)
Please advise 

## 2018-03-01 ENCOUNTER — Other Ambulatory Visit (INDEPENDENT_AMBULATORY_CARE_PROVIDER_SITE_OTHER): Payer: Self-pay | Admitting: Physician Assistant

## 2018-03-03 NOTE — Telephone Encounter (Signed)
Please advise.  Last seen in December

## 2018-05-05 ENCOUNTER — Telehealth (INDEPENDENT_AMBULATORY_CARE_PROVIDER_SITE_OTHER): Payer: Self-pay | Admitting: Physician Assistant

## 2018-05-05 NOTE — Telephone Encounter (Signed)
Noted  

## 2018-05-05 NOTE — Telephone Encounter (Signed)
Patient walked in today, he states he would like to have bilateral knee gel injections. If this is ok, please submit for authorization. # [id number]

## 2018-05-08 IMAGING — CR DG FOOT COMPLETE 3+V*R*
3 series · 3 of 3 positions shown · non-contrast
Comparison: None.

CLINICAL DATA: Stepped on downed branch and rolled right foot, with
right foot pain and gait change. Initial encounter.

EXAM:
RIGHT FOOT COMPLETE - 3+ VIEW

[t foot ap right]
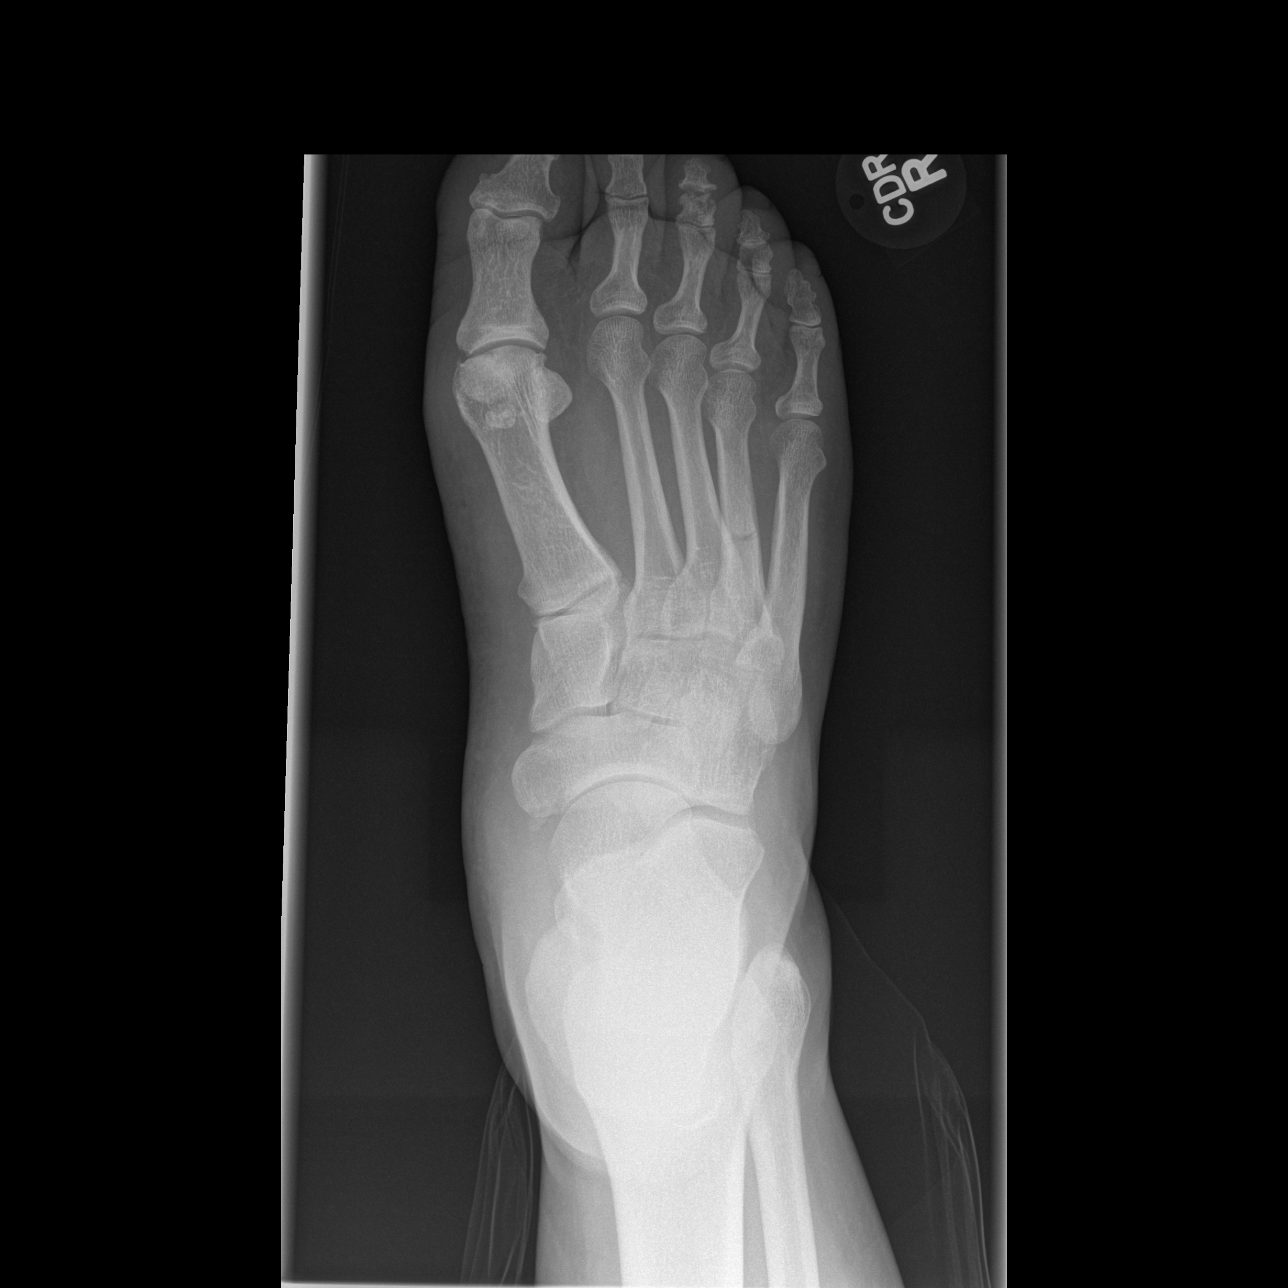

[t foot oblique right]
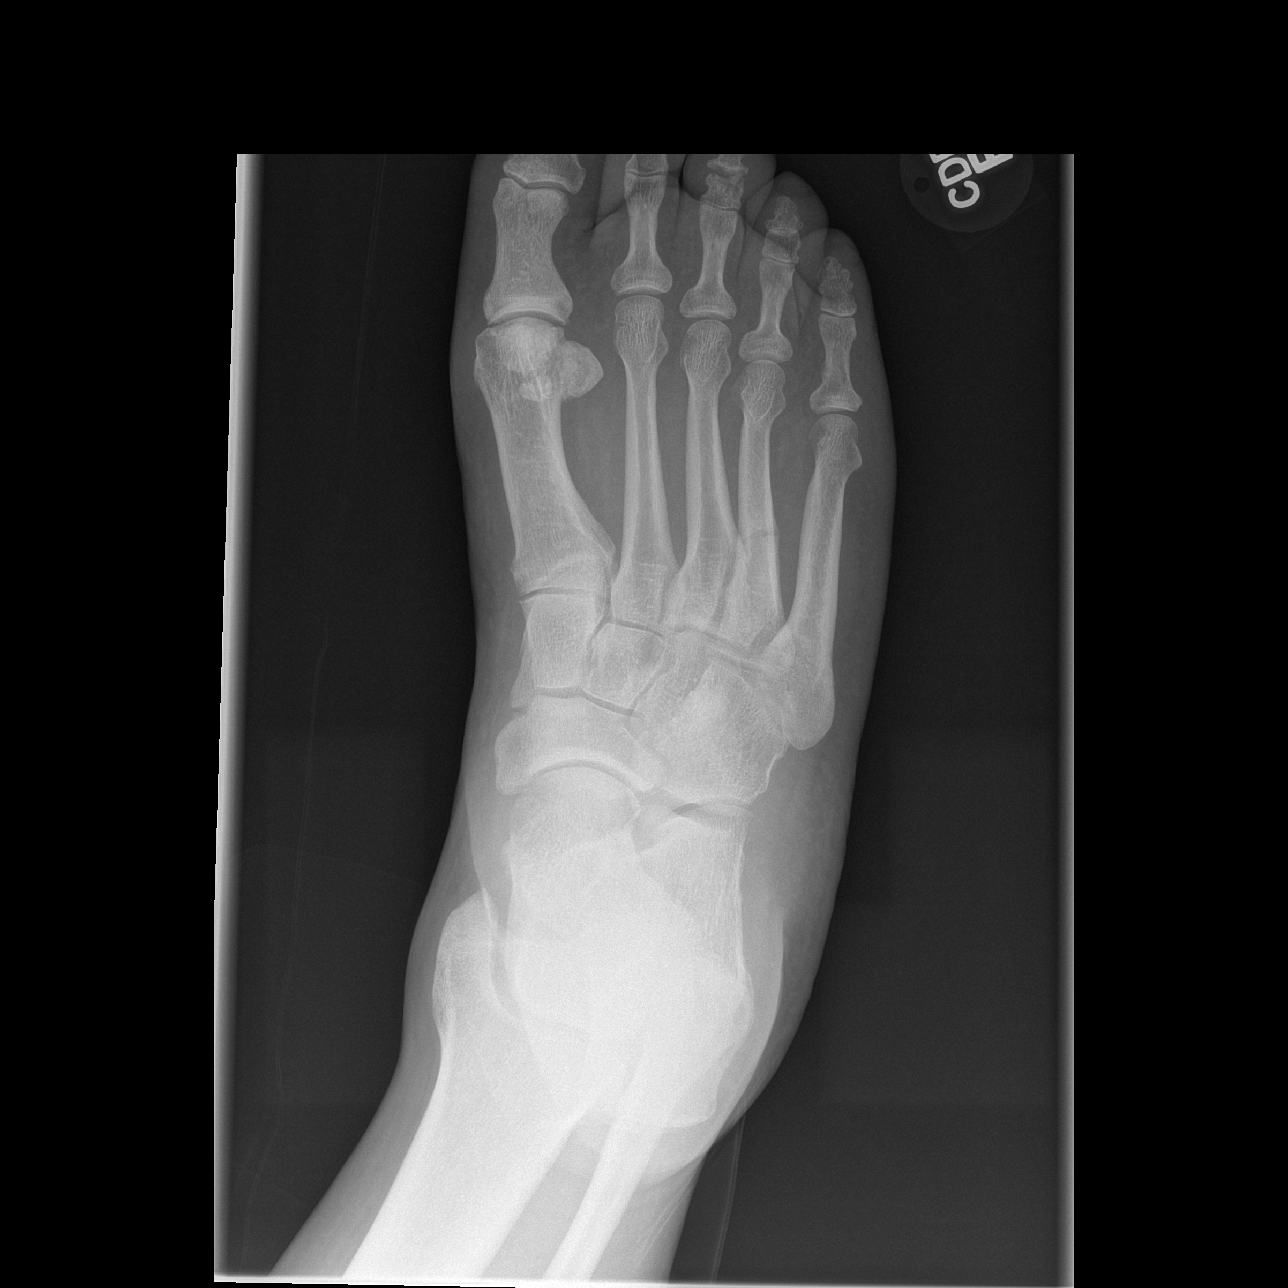

[t foot lat right]
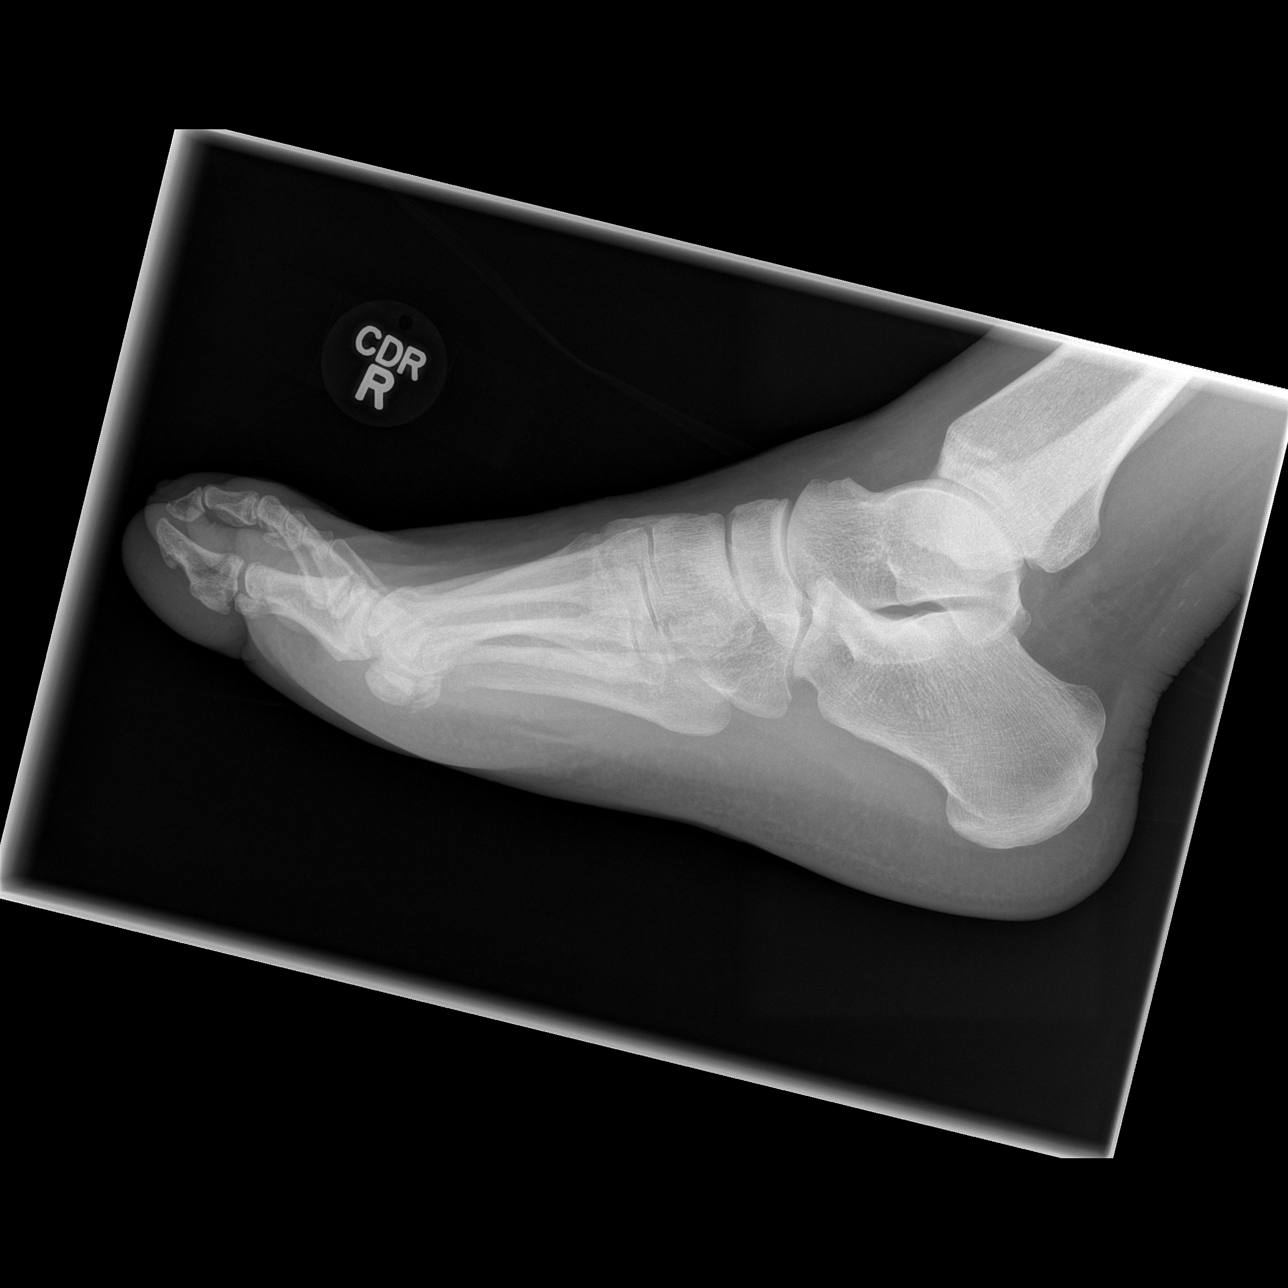

[3 of 3 positions shown; findings below may reference images not displayed]

FINDINGS: There is a nondisplaced fracture through the proximal to mid fourth
metatarsal. No additional fractures are seen.

A bipartite medial sesamoid of the first toe is noted. The joint
spaces are preserved. There is no evidence of talar subluxation; the
subtalar joint is unremarkable in appearance.

No significant soft tissue abnormalities are seen.
IMPRESSION: 1. Nondisplaced fracture through the proximal to mid fourth
metatarsal.
2. Bipartite medial sesamoid of the first toe.

## 2018-05-28 ENCOUNTER — Other Ambulatory Visit (INDEPENDENT_AMBULATORY_CARE_PROVIDER_SITE_OTHER): Payer: Self-pay | Admitting: Physician Assistant

## 2018-05-28 NOTE — Telephone Encounter (Signed)
Please advise 

## 2018-07-25 ENCOUNTER — Encounter (INDEPENDENT_AMBULATORY_CARE_PROVIDER_SITE_OTHER): Payer: Self-pay | Admitting: Family Medicine

## 2018-07-25 ENCOUNTER — Telehealth (INDEPENDENT_AMBULATORY_CARE_PROVIDER_SITE_OTHER): Payer: Self-pay | Admitting: Orthopaedic Surgery

## 2018-07-25 ENCOUNTER — Ambulatory Visit (INDEPENDENT_AMBULATORY_CARE_PROVIDER_SITE_OTHER): Payer: Self-pay

## 2018-07-25 ENCOUNTER — Ambulatory Visit (INDEPENDENT_AMBULATORY_CARE_PROVIDER_SITE_OTHER): Payer: 59 | Admitting: Family Medicine

## 2018-07-25 ENCOUNTER — Telehealth (INDEPENDENT_AMBULATORY_CARE_PROVIDER_SITE_OTHER): Payer: Self-pay

## 2018-07-25 VITALS — BP 159/75 | HR 75 | Ht 71.75 in | Wt 280.0 lb

## 2018-07-25 DIAGNOSIS — M25562 Pain in left knee: Secondary | ICD-10-CM

## 2018-07-25 DIAGNOSIS — M25561 Pain in right knee: Secondary | ICD-10-CM

## 2018-07-25 DIAGNOSIS — G8929 Other chronic pain: Secondary | ICD-10-CM

## 2018-07-25 NOTE — Telephone Encounter (Signed)
Patient would like to have a gel injection.  Will this be okay with Dr. Prince RomeHilts?  Please let me know.  Thank You

## 2018-07-25 NOTE — Telephone Encounter (Signed)
Gel injection request  °

## 2018-07-25 NOTE — Telephone Encounter (Signed)
This was handled at his appointment today.

## 2018-07-25 NOTE — Telephone Encounter (Signed)
Patient will come in today to see Dr.Hilts he saw him for years. Mr.Leider will probably continue care with Dr.Hilts not sure of how that works clinically.

## 2018-07-25 NOTE — Progress Notes (Signed)
   Office Visit Note   Patient: Roger NuttingJulius A Garrette           Date of Birth: 12/30/1970           MRN: [id number] Visit Date: 07/25/2018 Requested by: Merri BrunetteSmith, Candace, MD [phone] [place] [place], [place] [address] PCP: Merri BrunetteSmith, Candace, MD  Subjective: Chief Complaint  Patient presents with  . Left Knee - Pain  . Right Knee - Pain  . Knee Pain    using clebrex for pain , on going for a while     HPI: He is here with bilateral knee pain.  47 year old with known osteoarthritis in his knees.  Visco supplementation injections have given him some relief, last ones were in December 2018.  He would like to have them again if possible.  We have requested approval in September.  Due to various injuries, he is gained quite a bit of weight since he has been unable to exercise like he used to.  He is currently trying to lose weight again.  Eventually he would like to have his knees replaced, hopefully in a few years.                ROS: Otherwise noncontributory  Objective: Vital Signs: BP (!) 159/75   Pulse 75   Ht 5' 11.75" (1.822 m)   Wt 280 lb (127 kg)   BMI 38.24 kg/m   Physical Exam:  Knees: 2-3+ patellofemoral crepitus in both knees.  1+ effusion in both knees with no warmth or erythema.  Full extension, flexion of 125 degrees.  Slight tenderness medial joint line of each knee.  Imaging: None today.  He states that he has had some about a year ago.  Assessment & Plan: 1.  Chronic bilateral knee pain due to osteoarthritis - Will request approval for gel injections.     Follow-Up Instructions: No follow-ups on file.      Procedures: No procedures performed  No notes on file    PMFS History: There are no active problems to display for this patient.  History reviewed. No pertinent past medical history.  History reviewed. No pertinent family history.  Past Surgical History:  Procedure Laterality Date  . KNEE SURGERY    . SHOULDER SURGERY     Social History     Occupational History  . Not on file  Tobacco Use  . Smoking status: Never Smoker  . Smokeless tobacco: Never Used  Substance and Sexual Activity  . Alcohol use: No  . Drug use: No  . Sexual activity: Not on file

## 2018-07-25 NOTE — Telephone Encounter (Signed)
Submitted VOB for Durolane bilateral Knee.

## 2018-07-28 ENCOUNTER — Telehealth (INDEPENDENT_AMBULATORY_CARE_PROVIDER_SITE_OTHER): Payer: Self-pay

## 2018-07-28 NOTE — Telephone Encounter (Signed)
Faxed office notes to [phone] for VOB at [phone].

## 2018-08-07 ENCOUNTER — Telehealth (INDEPENDENT_AMBULATORY_CARE_PROVIDER_SITE_OTHER): Payer: Self-pay

## 2018-08-07 NOTE — Telephone Encounter (Signed)
Talked with patient and advised him that he is approved for gel injection.  Approved for Durolane, bilateral knee. Covered at 80% of the allowable amount after the deductible has been met. Patient will be responsible for 20% OOP. May have a Co-pay of $50.00?  Appt. 08/11/2018 with Dr. Junius Roads.

## 2018-08-11 ENCOUNTER — Ambulatory Visit (INDEPENDENT_AMBULATORY_CARE_PROVIDER_SITE_OTHER): Payer: 59 | Admitting: Family Medicine

## 2018-08-11 ENCOUNTER — Encounter (INDEPENDENT_AMBULATORY_CARE_PROVIDER_SITE_OTHER): Payer: Self-pay | Admitting: Family Medicine

## 2018-08-11 DIAGNOSIS — G8929 Other chronic pain: Secondary | ICD-10-CM | POA: Diagnosis not present

## 2018-08-11 DIAGNOSIS — M170 Bilateral primary osteoarthritis of knee: Secondary | ICD-10-CM | POA: Diagnosis not present

## 2018-08-11 DIAGNOSIS — M25562 Pain in left knee: Secondary | ICD-10-CM | POA: Diagnosis not present

## 2018-08-11 DIAGNOSIS — M25561 Pain in right knee: Secondary | ICD-10-CM | POA: Diagnosis not present

## 2018-08-11 NOTE — Progress Notes (Signed)
Subjective: He is here for planned Durolane injections for bilateral knee osteoarthritis.  Objective: 1+ effusion in both knees, no warmth or erythema.  Impression: Bilateral knee osteoarthritis  Procedure:  Durolane injections both knees: After sterile prep with Betadine, injected 3 cc 1% lidocaine without epinephrine and Durolane from superolateral approach, a flash of clear yellow synovial fluid was obtained prior to each injection.

## 2018-08-28 ENCOUNTER — Other Ambulatory Visit (INDEPENDENT_AMBULATORY_CARE_PROVIDER_SITE_OTHER): Payer: Self-pay | Admitting: Orthopaedic Surgery

## 2018-08-28 NOTE — Telephone Encounter (Signed)
Please advise 

## 2018-12-22 ENCOUNTER — Ambulatory Visit (INDEPENDENT_AMBULATORY_CARE_PROVIDER_SITE_OTHER): Payer: 59 | Admitting: Family Medicine

## 2018-12-22 ENCOUNTER — Other Ambulatory Visit: Payer: Self-pay

## 2018-12-22 ENCOUNTER — Other Ambulatory Visit: Payer: Self-pay | Admitting: Family Medicine

## 2018-12-22 ENCOUNTER — Encounter: Payer: Self-pay | Admitting: Family Medicine

## 2018-12-22 VITALS — BP 139/89 | HR 63 | Temp 98.1°F | Resp 12 | Ht 71.75 in | Wt 283.6 lb

## 2018-12-22 DIAGNOSIS — R7989 Other specified abnormal findings of blood chemistry: Secondary | ICD-10-CM | POA: Insufficient documentation

## 2018-12-22 DIAGNOSIS — N529 Male erectile dysfunction, unspecified: Secondary | ICD-10-CM

## 2018-12-22 DIAGNOSIS — Z0000 Encounter for general adult medical examination without abnormal findings: Secondary | ICD-10-CM

## 2018-12-22 DIAGNOSIS — R945 Abnormal results of liver function studies: Secondary | ICD-10-CM | POA: Diagnosis not present

## 2018-12-22 DIAGNOSIS — R5383 Other fatigue: Secondary | ICD-10-CM

## 2018-12-22 DIAGNOSIS — E669 Obesity, unspecified: Secondary | ICD-10-CM | POA: Insufficient documentation

## 2018-12-22 DIAGNOSIS — E785 Hyperlipidemia, unspecified: Secondary | ICD-10-CM

## 2018-12-22 DIAGNOSIS — Z6838 Body mass index (BMI) 38.0-38.9, adult: Secondary | ICD-10-CM

## 2018-12-22 DIAGNOSIS — Z9989 Dependence on other enabling machines and devices: Secondary | ICD-10-CM | POA: Insufficient documentation

## 2018-12-22 DIAGNOSIS — E66812 Obesity, class 2: Secondary | ICD-10-CM | POA: Insufficient documentation

## 2018-12-22 DIAGNOSIS — G4733 Obstructive sleep apnea (adult) (pediatric): Secondary | ICD-10-CM | POA: Insufficient documentation

## 2018-12-22 NOTE — Progress Notes (Signed)
Office Visit Note   Patient: Roger NuttingJulius A Debruyn           Date of Birth: 10/22/1970           MRN: [id number] Visit Date: 12/22/2018 Requested by: Merri BrunetteSmith, Candace, MD [phone] [place] [place], [place] [address] PCP: Merri BrunetteSmith, Candace, MD  Subjective: Chief Complaint  Patient presents with  . Annual Exam    HPI: He is here for annual wellness exam.  He has a history of chronically elevated liver function tests.  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  He is asymptomatic from that standpoint.  He was referred for sleep study a few years ago and it was normal.  He was having snoring at that time which was thought to be related to gaining weight.  He struggles with bilateral knee osteoarthritis.  This has been worse since being sedentary and gaining weight.  He is trying to work on those things.  He has erectile dysfunction mildly and uses Viagra with success.  He has decreased libido.  He would like to have his testosterone levels checked.  Otherwise no complaints or concerns.              ROS: All other systems were reviewed and are negative.  Health Maintenance: Colonoscopy: Not yet Immunizations: Up-to-date through work Pap/Mammogram: Not applicable Dentist: Every 6 months Eye: 2019   Objective: Vital Signs: BP 139/89 (BP Location: Left Arm, Patient Position: Sitting, Cuff Size: Large)   Pulse 63   Temp 98.1 F (36.7 C)   Resp 12   Ht 5' 11.75" (1.822 m)   Wt 283 lb 9.6 oz (128.6 kg)   BMI 38.73 kg/m   Physical Exam:  HEENT:  [place]/AT, PERRLA, EOM Full, no nystagmus.  Funduscopic examination within normal limits.  No conjunctival erythema.  Tympanic membranes are pearly gray with normal landmarks.  External ear canals are normal.  Nasal passages are clear.  Oropharynx is clear.  No significant lymphadenopathy.  No thyromegaly or nodules.   2+ carotid pulses without bruits. CV: Regular rate and rhythm without murmurs, rubs, or gallops.  No peripheral edema.  2+ radial and posterior tibial pulses. Lungs: Clear to auscultation throughout with no wheezing or areas of consolidation. Abd: Bowel sounds are active, no hepatosplenomegaly or masses.  Soft and nontender.  No audible bruits.  No evidence of ascites.  He does have a very slight diastases recti. GU: Deferred Skin: No rash or lesions.    Imaging: None today.  Assessment & Plan: 1.  Annual wellness exam -Labs today, follow-up in 1 year. -Colonoscopy age 48. -PSA today, DRE starting at age 48.  He has had multiple DRE's which have been negative so far.  2.  Obesity -He will start working on dietary changes.  He will begin his usual exercise regimen when able.  3.  Erectile dysfunction -We will check testosterone level and thyroid function today.  4.  History of hyperlipidemia -C-reactive protein today.   Follow-Up Instructions: Return in about 1 year (around 12/22/2019).     Procedures: None   PMFS History: Patient Active Problem List   Diagnosis Date Noted  . Class 2 obesity without serious comorbidity with body mass index (BMI) of 38.0 to 38.9 in adult 12/22/2018  . OSA on CPAP 12/22/2018   History reviewed. No pertinent past medical history.  Family  History  Problem Relation Age of Onset  . Hypertension Mother   . Irregular heart beat Mother   . Osteoarthritis Mother        Knees  . Diabetes Father   . Hypertension Father   . Other Father        Elevated LFT  . Leukemia Sister   . Prostate cancer Paternal Uncle   . Prostate cancer Maternal Uncle     Past Surgical History:  Procedure Laterality Date  . KNEE SURGERY    . SHOULDER SURGERY     Social History   Occupational History  . Not on file  Tobacco Use  . Smoking status: Never Smoker  . Smokeless tobacco: Never Used  Substance and Sexual Activity  . Alcohol use: No  . Drug use:  No  . Sexual activity: Not on file

## 2018-12-23 ENCOUNTER — Telehealth: Payer: Self-pay | Admitting: Family Medicine

## 2018-12-23 LAB — PSA: PSA: 2 ng/mL (ref <=4.0)

## 2018-12-23 LAB — CBC WITH DIFFERENTIAL/PLATELET
Absolute Monocytes: 331 cells/uL (ref 200–950)
Basophils Absolute: 18 cells/uL (ref 0–200)
Basophils Relative: 0.4 %
Eosinophils Absolute: 32 cells/uL (ref 15–500)
Eosinophils Relative: 0.7 %
HCT: 42.3 % (ref 38.5–50.0)
Hemoglobin: 14.7 g/dL (ref 13.2–17.1)
Lymphs Abs: 1955 cells/uL (ref 850–3900)
MCH: 31.7 pg (ref 27.0–33.0)
MCHC: 34.8 g/dL (ref 32.0–36.0)
MCV: 91.2 fL (ref 80.0–100.0)
MPV: 10.4 fL (ref 7.5–12.5)
Monocytes Relative: 7.2 %
Neutro Abs: 2263 cells/uL (ref 1500–7800)
Neutrophils Relative %: 49.2 %
Platelets: 283 10*3/uL (ref 140–400)
RBC: 4.64 10*6/uL (ref 4.20–5.80)
RDW: 12.5 % (ref 11.0–15.0)
Total Lymphocyte: 42.5 %
WBC: 4.6 10*3/uL (ref 3.8–10.8)

## 2018-12-23 LAB — HEPATIC FUNCTION PANEL
AG Ratio: 1.5 (calc) (ref 1.0–2.5)
ALT: 31 U/L (ref 9–46)
AST: 48 U/L — ABNORMAL HIGH (ref 10–40)
Albumin: 4.5 g/dL (ref 3.6–5.1)
Alkaline phosphatase (APISO): 34 U/L — ABNORMAL LOW (ref 36–130)
Bilirubin, Direct: 0.1 mg/dL (ref 0.0–0.2)
Globulin: 3.1 g/dL (calc) (ref 1.9–3.7)
Indirect Bilirubin: 0.4 mg/dL (calc) (ref 0.2–1.2)
Total Bilirubin: 0.5 mg/dL (ref 0.2–1.2)
Total Protein: 7.6 g/dL (ref 6.1–8.1)

## 2018-12-23 LAB — BASIC METABOLIC PANEL
BUN: 20 mg/dL (ref 7–25)
CO2: 25 mmol/L (ref 20–32)
Calcium: 9.7 mg/dL (ref 8.6–10.3)
Chloride: 102 mmol/L (ref 98–110)
Creat: 1.01 mg/dL (ref 0.60–1.35)
Glucose, Bld: 95 mg/dL (ref 65–99)
Potassium: 4.4 mmol/L (ref 3.5–5.3)
Sodium: 140 mmol/L (ref 135–146)

## 2018-12-23 LAB — THYROID PANEL WITH TSH
Free Thyroxine Index: 2.2 (ref 1.4–3.8)
T3 Uptake: 28 % (ref 22–35)
T4, Total: 7.8 ug/dL (ref 4.9–10.5)
TSH: 2.92 mIU/L (ref 0.40–4.50)

## 2018-12-23 LAB — VITAMIN D 25 HYDROXY (VIT D DEFICIENCY, FRACTURES): Vit D, 25-Hydroxy: 14 ng/mL — ABNORMAL LOW (ref 30–100)

## 2018-12-23 LAB — TESTOSTERONE TOTAL,FREE,BIO, MALES
Albumin: 4.5 g/dL (ref 3.6–5.1)
Sex Hormone Binding: 36 nmol/L (ref 10–50)
Testosterone, Bioavailable: 97.3 ng/dL — ABNORMAL LOW (ref >=110.0)
Testosterone, Free: 47.3 pg/mL (ref 46.0–224.0)
Testosterone: 390 ng/dL (ref 250–827)

## 2018-12-23 LAB — LIPID PANEL
Cholesterol: 209 mg/dL — ABNORMAL HIGH (ref <200)
HDL: 36 mg/dL — ABNORMAL LOW (ref >=40)
LDL Cholesterol (Calc): 146 mg/dL (calc) — ABNORMAL HIGH
Non-HDL Cholesterol (Calc): 173 mg/dL (calc) — ABNORMAL HIGH (ref <130)
Total CHOL/HDL Ratio: 5.8 (calc) — ABNORMAL HIGH (ref <5.0)
Triglycerides: 145 mg/dL (ref <150)

## 2018-12-23 LAB — HIGH SENSITIVITY CRP: hs-CRP: 4.1 mg/L — ABNORMAL HIGH

## 2018-12-23 NOTE — Telephone Encounter (Signed)
Labs show:  Minimally elevated liver enzymes.  Very low vitamin D of 14 (goal is 50-80).  Elevated LDL cholesterol relative to HDL.    Elevated C-Reactive protein (inflammation marker).  Testosterone is still pending.  All other results look good.  I recommend:  Minimize dietary intake of processed carbohydrates including breads, pastas, cereals, sugars and sweets.  This will help lipid numbers and C-reactive protein.  Take over-the-counter vitamin D3, 5000 IU tablets, 2 of them daily for the next 3 months and then 1 daily after that.  Recheck in 4 to 6 months to be sure we are at target level.

## 2018-12-23 NOTE — Telephone Encounter (Signed)
Testosterone level is decent at 390.  Not low enough to warrant treatment.  Should improve with dietary changes mentioned previously, as well as weight loss.

## 2018-12-23 NOTE — Telephone Encounter (Signed)
Patient advised.

## 2018-12-23 NOTE — Telephone Encounter (Signed)
I advised patient of the results (including testosterone - results on a separate note). Instructions given on dietary changes and starting vitamin D3 - patient read back the vitamin D3 instructions. Invitation sent for getting on MyChart - patient will try this so he can see his actual lab results, and call me back if he has any issues so that we can get the results to him by email/mail.

## 2019-01-06 ENCOUNTER — Other Ambulatory Visit (INDEPENDENT_AMBULATORY_CARE_PROVIDER_SITE_OTHER): Payer: Self-pay | Admitting: Orthopaedic Surgery

## 2019-02-18 ENCOUNTER — Telehealth: Payer: Self-pay

## 2019-02-18 NOTE — Telephone Encounter (Signed)
Noted! Thank you

## 2019-02-18 NOTE — Telephone Encounter (Signed)
Dr. Junius Roads has ordered bilateral knee visco supplementation for the patient (bilateral OA). He last had bilateral Durolane injections in December 2019. The patient is wanting to come in tomorrow for this, but I did respond to his MyChart message informing him we cannot promise so quick a response from his insurance.

## 2019-02-18 NOTE — Telephone Encounter (Signed)
Submitted VOB for Durolane, bilateral knee. °

## 2019-02-24 ENCOUNTER — Encounter: Payer: Self-pay | Admitting: Family Medicine

## 2019-02-24 ENCOUNTER — Other Ambulatory Visit: Payer: Self-pay

## 2019-02-24 ENCOUNTER — Ambulatory Visit (INDEPENDENT_AMBULATORY_CARE_PROVIDER_SITE_OTHER): Payer: 59 | Admitting: Family Medicine

## 2019-02-24 DIAGNOSIS — G8929 Other chronic pain: Secondary | ICD-10-CM

## 2019-02-24 DIAGNOSIS — M1712 Unilateral primary osteoarthritis, left knee: Secondary | ICD-10-CM

## 2019-02-24 DIAGNOSIS — M1711 Unilateral primary osteoarthritis, right knee: Secondary | ICD-10-CM

## 2019-02-25 ENCOUNTER — Telehealth: Payer: Self-pay

## 2019-02-25 NOTE — Telephone Encounter (Signed)
Patient is approved for Durolane, bilateral knee. Buy & Bill Covered at 100% of the allowable amount. Co-pay of $25.00 required No PA required

## 2019-02-25 NOTE — Progress Notes (Signed)
S:  Here for planned bilateral Durolane knee injections.  O:  Trace right knee effusion, none on the left.  Good ROM with tenderness on medial joint line.  Procedure:  Bilateral Durolane injections:  After sterile prep with alcohol, injected 3 cc 1% lidocaine without epi and Durolane from lateral mid-patellar approach into each knee.  Flash of synovial fluid on the right.

## 2019-04-04 ENCOUNTER — Other Ambulatory Visit (INDEPENDENT_AMBULATORY_CARE_PROVIDER_SITE_OTHER): Payer: Self-pay | Admitting: Orthopaedic Surgery

## 2019-07-22 ENCOUNTER — Other Ambulatory Visit (INDEPENDENT_AMBULATORY_CARE_PROVIDER_SITE_OTHER): Payer: Self-pay | Admitting: Orthopaedic Surgery

## 2019-07-23 NOTE — Telephone Encounter (Signed)
Refill ok? 

## 2019-07-29 ENCOUNTER — Ambulatory Visit: Payer: 59 | Admitting: Family Medicine

## 2019-07-29 ENCOUNTER — Other Ambulatory Visit: Payer: Self-pay

## 2019-07-29 ENCOUNTER — Encounter: Payer: Self-pay | Admitting: Family Medicine

## 2019-07-29 ENCOUNTER — Ambulatory Visit: Payer: Self-pay

## 2019-07-29 DIAGNOSIS — G8929 Other chronic pain: Secondary | ICD-10-CM | POA: Diagnosis not present

## 2019-07-29 DIAGNOSIS — M79674 Pain in right toe(s): Secondary | ICD-10-CM | POA: Diagnosis not present

## 2019-07-29 DIAGNOSIS — M25562 Pain in left knee: Secondary | ICD-10-CM

## 2019-07-29 DIAGNOSIS — M19071 Primary osteoarthritis, right ankle and foot: Secondary | ICD-10-CM | POA: Diagnosis not present

## 2019-07-29 DIAGNOSIS — M170 Bilateral primary osteoarthritis of knee: Secondary | ICD-10-CM | POA: Diagnosis not present

## 2019-07-29 DIAGNOSIS — M25561 Pain in right knee: Secondary | ICD-10-CM

## 2019-07-29 NOTE — Progress Notes (Signed)
Office Visit Note   Patient: Roger Pace           Date of Birth: July 31, 1971           MRN: [id number] Visit Date: 07/29/2019 Requested by: Lavada Mesi, MD [address] [place],  [place] [address] PCP: Lavada Mesi, MD  Subjective: Chief Complaint  Patient presents with  . Right Knee - Pain    Would like to have prolotherapy injections in both knees.  . Left Knee - Pain  . Right Great Toe - Pain    Pain is worsening.    HPI: He is here with bilateral knee pain and right great toe pain.  History of osteoarthritis in his knees.  The right one hurts more than the left, and hurts mostly on the lateral aspect.  He had gel injections in July and is interested in trying prolotherapy with dextrose in both knees.  His right great toe has bothered him intermittently since he was a teenager.  He states that he was diagnosed with a sesamoid bone fracture and has had periodic pain on the plantar aspect of his great toe but in the past year or so, he has started to have pain on the dorsal aspect of his MTP joint.  He wonders whether a cortisone injection might help.               ROS: No fevers or chills.  All other systems were reviewed and are negative.  Objective: Vital Signs: There were no vitals taken for this visit.  Physical Exam:  General:  Alert and oriented, in no acute distress. Pulm:  Breathing unlabored. Psy:  Normal mood, congruent affect. Skin: No rash or bruising. Knees: Trace effusion in both knees with no warmth.  Tender on the lateral joint line especially on the right.  Good range of motion still.  Right foot: He is first metatarsal is slightly shorter than the second.  There is a palpable dorsal spur at the MTP joint of the great toe, and his dorsiflexion is limited to about 50 degrees.  He is tender to palpation directly over the joint.  Mild tenderness near the sesamoid bones.   Imaging: None other than for needle guidance  Assessment & Plan: 1.   Chronic bilateral knee pain due to osteoarthritis -Dextrose prolotherapy today, follow-up in 3 to 4 weeks for repeat injection if needed.  2.  Right first MTP DJD -Ultrasound-guided steroid injection today.     Procedures: Bilateral knee dextrose injections: After sterile prep with Betadine, injected 6 cc 1% lidocaine without epinephrine and 4 cc 50% dextrose into each knee from superolateral approach.  Ultrasound was used but did not bill for that. Right first MTP injection: After sterile prep with Betadine, injected 2 cc 1% lidocaine without epinephrine and 1 cc methylprednisolone from dorsal approach into the joint recess using ultrasound guidance.    PMFS History: Patient Active Problem List   Diagnosis Date Noted  . Class 2 obesity without serious comorbidity with body mass index (BMI) of 38.0 to 38.9 in adult 12/22/2018  . Elevated LFTs 12/22/2018  . Erectile dysfunction 12/22/2018   History reviewed. No pertinent past medical history.  Family History  Problem Relation Age of Onset  . Hypertension Mother   . Irregular heart beat Mother   . Osteoarthritis Mother        Knees  . Diabetes Father   . Hypertension Father   . Other Father  Elevated LFT  . Leukemia Sister   . Prostate cancer Paternal Uncle   . Prostate cancer Maternal Uncle     Past Surgical History:  Procedure Laterality Date  . KNEE SURGERY    . SHOULDER SURGERY     Social History   Occupational History  . Not on file  Tobacco Use  . Smoking status: Never Smoker  . Smokeless tobacco: Never Used  Substance and Sexual Activity  . Alcohol use: No  . Drug use: No  . Sexual activity: Not on file

## 2019-09-11 ENCOUNTER — Telehealth: Payer: Self-pay

## 2019-09-11 NOTE — Telephone Encounter (Signed)
Submitted VOB for Durolane, bilateral knee. °

## 2019-09-15 ENCOUNTER — Telehealth: Payer: Self-pay

## 2019-09-15 NOTE — Telephone Encounter (Signed)
Approved for Durolane, bilateral knee. Buy & Bill Must meet deductible first Patient is responsible for 20% OOP. Co-pay of $50.00 No PA required  Appt.09/16/2019 with Dr. Prince Rome

## 2019-09-16 ENCOUNTER — Ambulatory Visit: Payer: 59 | Admitting: Family Medicine

## 2019-09-16 ENCOUNTER — Other Ambulatory Visit: Payer: Self-pay

## 2019-09-16 ENCOUNTER — Encounter: Payer: Self-pay | Admitting: Family Medicine

## 2019-09-16 ENCOUNTER — Ambulatory Visit: Payer: Self-pay

## 2019-09-16 DIAGNOSIS — M1711 Unilateral primary osteoarthritis, right knee: Secondary | ICD-10-CM

## 2019-09-16 DIAGNOSIS — M1712 Unilateral primary osteoarthritis, left knee: Secondary | ICD-10-CM | POA: Diagnosis not present

## 2019-09-16 DIAGNOSIS — M25561 Pain in right knee: Secondary | ICD-10-CM

## 2019-09-16 DIAGNOSIS — M25562 Pain in left knee: Secondary | ICD-10-CM

## 2019-09-16 DIAGNOSIS — G8929 Other chronic pain: Secondary | ICD-10-CM

## 2019-09-16 NOTE — Progress Notes (Signed)
   Office Visit Note   Patient: Roger Pace           Date of Birth: 01/23/1971           MRN: [id number] Visit Date: 09/16/2019 Requested by: Lavada Mesi, MD [address] [place],  [place] [address] PCP: Lavada Mesi, MD  Subjective: Chief Complaint  Patient presents with  . bilateral knee Durolane injections    HPI: He is here for planned bilateral Durolane injections for knee osteoarthritis.              ROS:   All other systems were reviewed and are negative.  Objective: Vital Signs: There were no vitals taken for this visit.  Physical Exam:  General:  Alert and oriented, in no acute distress. Pulm:  Breathing unlabored. Psy:  Normal mood, congruent affect.  Knees: Trace effusion in both knees with no warmth or erythema.  Imaging: None today  Assessment & Plan: 1.  Bilateral knee osteoarthritis -Durolane injections today.  Follow-up as needed.     Procedures: Bilateral knee injections: After sterile prep with alcohol, injected 3 cc 1% lidocaine without epinephrine and Durolane from lateral midpatellar approach on the right and lateral midpatellar approach on the left using ultrasound to guide needle placement on the right.  A flash of synovial fluid was obtained prior to both injections.    PMFS History: Patient Active Problem List   Diagnosis Date Noted  . Class 2 obesity without serious comorbidity with body mass index (BMI) of 38.0 to 38.9 in adult 12/22/2018  . Elevated LFTs 12/22/2018  . Erectile dysfunction 12/22/2018   No past medical history on file.  Family History  Problem Relation Age of Onset  . Hypertension Mother   . Irregular heart beat Mother   . Osteoarthritis Mother        Knees  . Diabetes Father   . Hypertension Father   . Other Father        Elevated LFT  . Leukemia Sister   . Prostate cancer Paternal Uncle   . Prostate cancer Maternal Uncle     Past Surgical History:  Procedure Laterality Date  . KNEE SURGERY    .  SHOULDER SURGERY     Social History   Occupational History  . Not on file  Tobacco Use  . Smoking status: Never Smoker  . Smokeless tobacco: Never Used  Substance and Sexual Activity  . Alcohol use: No  . Drug use: No  . Sexual activity: Not on file

## 2019-10-27 ENCOUNTER — Other Ambulatory Visit: Payer: Self-pay

## 2019-10-27 MED ORDER — CELECOXIB 200 MG PO CAPS: 200.0000 mg | ORAL_CAPSULE | Freq: Two times a day (BID) | ORAL | 2 refills | Status: DC | PRN

## 2020-01-22 ENCOUNTER — Other Ambulatory Visit: Payer: Self-pay | Admitting: Orthopaedic Surgery

## 2020-03-01 ENCOUNTER — Ambulatory Visit: Payer: 59 | Admitting: Family Medicine

## 2020-03-01 ENCOUNTER — Encounter: Payer: Self-pay | Admitting: Family Medicine

## 2020-03-01 ENCOUNTER — Other Ambulatory Visit: Payer: Self-pay

## 2020-03-01 VITALS — BP 131/75 | HR 72 | Ht 71.75 in | Wt 283.8 lb

## 2020-03-01 DIAGNOSIS — E669 Obesity, unspecified: Secondary | ICD-10-CM

## 2020-03-01 DIAGNOSIS — M1712 Unilateral primary osteoarthritis, left knee: Secondary | ICD-10-CM

## 2020-03-01 DIAGNOSIS — E559 Vitamin D deficiency, unspecified: Secondary | ICD-10-CM

## 2020-03-01 DIAGNOSIS — Z8042 Family history of malignant neoplasm of prostate: Secondary | ICD-10-CM

## 2020-03-01 DIAGNOSIS — Z6838 Body mass index (BMI) 38.0-38.9, adult: Secondary | ICD-10-CM

## 2020-03-01 DIAGNOSIS — M1711 Unilateral primary osteoarthritis, right knee: Secondary | ICD-10-CM

## 2020-03-01 DIAGNOSIS — E785 Hyperlipidemia, unspecified: Secondary | ICD-10-CM | POA: Diagnosis not present

## 2020-03-01 DIAGNOSIS — Z0000 Encounter for general adult medical examination without abnormal findings: Secondary | ICD-10-CM

## 2020-03-01 DIAGNOSIS — R7982 Elevated C-reactive protein (CRP): Secondary | ICD-10-CM

## 2020-03-01 DIAGNOSIS — R7989 Other specified abnormal findings of blood chemistry: Secondary | ICD-10-CM | POA: Diagnosis not present

## 2020-03-01 NOTE — Progress Notes (Signed)
Office Visit Note   Patient: Roger Pace           Date of Birth: 1971-03-18           MRN: [id number] Visit Date: 03/01/2020 Requested by: Lavada Mesi, MD [address] [place],  [place] [address] PCP: Lavada Mesi, MD  Subjective: Chief Complaint  Patient presents with  . Annual Exam  . bilateral knee prolotherapy injections    HPI: He is here for annual wellness exam.  Both of his knees are bothering him again.  He has osteoarthritis.  In January we did Durolane injections with good results.  He would like dextrose prolotherapy today to give him some relief until we can get approval for gel injections again.  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  Previous labs were notable for elevated C-reactive protein, and vitamin D deficiency.  He gets occasional heartburn for which he uses Tums.  This happens about once every couple weeks.  Family history was updated.  He has several family members with prostate cancer and hypertension.  He is up-to-date with eye exams and dental exams.               ROS:   All other systems were reviewed and are negative.  Objective: Vital Signs: BP 131/75   Pulse 72   Ht 5' 11.75" (1.822 m)   Wt 283 lb 12.8 oz (128.7 kg)   BMI 38.76 kg/m   Physical Exam:  General:  Alert and oriented, in no acute distress. Pulm:  Breathing unlabored. Psy:  Normal mood, congruent affect. Skin: No suspicious lesions HEENT:  [place]/AT, PERRLA, EOM Full, no nystagmus.  Funduscopic examination within normal limits.  No conjunctival erythema.  Tympanic membranes are pearly gray with normal landmarks.  External ear canals are normal.  Nasal passages are clear.  Oropharynx is clear.  No significant lymphadenopathy.  No thyromegaly or nodules.  2+ carotid pulses without bruits. CV: Regular rate and rhythm without murmurs, rubs, or  gallops.  No peripheral edema.  2+ radial and posterior tibial pulses. Lungs: Clear to auscultation throughout with no wheezing or areas of consolidation. Abd: Bowel sounds are active, no hepatosplenomegaly or masses.  Soft and nontender.  No audible bruits.  No evidence of ascites. Knees: 1+ effusion bilaterally with no warmth or erythema.   Imaging: No results found.  Assessment & Plan: 1.  Wellness exam -Labs today.  He will work on better diet.  2.  Bilateral knee osteoarthritis -Each knee was injected with 6 cc 1% lidocaine without epinephrine and 4 cc 50% dextrose from lateral midpatellar approach.  A flash of synovial fluid was obtained prior to each injection.  We will request approval for Durolane injections.  3.  History of vitamin D deficiency and elevated C-reactive protein as well as elevated liver function tests -Recheck today.     Procedures: No procedures performed  No notes on file     PMFS History: Patient Active Problem List   Diagnosis Date Noted  . Class 2 obesity without serious comorbidity with body mass index (BMI) of 38.0 to 38.9 in adult 12/22/2018  . Elevated LFTs 12/22/2018  . Erectile dysfunction 12/22/2018   History reviewed. No pertinent past medical history.  Family History  Problem Relation Age of Onset  . Hypertension Mother   . Irregular heart beat Mother   .  Osteoarthritis Mother        Knees  . Diabetes Father   . Hypertension Father   . Other Father        Elevated LFT  . Prostate cancer Father   . Cancer Father   . Leukemia Sister   . Cancer Sister   . Prostate cancer Paternal Uncle   . Cancer Paternal Uncle   . Prostate cancer Maternal Uncle   . Cancer Maternal Uncle   . Arrhythmia Brother   . Hyperlipidemia Brother   . Hypertension Brother     Past Surgical History:  Procedure Laterality Date  . KNEE SURGERY    . SHOULDER SURGERY     Social History   Occupational History  . Not on file  Tobacco Use  . Smoking  status: Never Smoker  . Smokeless tobacco: Never Used  Substance and Sexual Activity  . Alcohol use: No  . Drug use: No  . Sexual activity: Not on file

## 2020-03-01 NOTE — Progress Notes (Signed)
Noted  

## 2020-03-02 ENCOUNTER — Telehealth: Payer: Self-pay | Admitting: Family Medicine

## 2020-03-02 LAB — THYROID PANEL WITH TSH
Free Thyroxine Index: 2.2 (ref 1.4–3.8)
T3 Uptake: 29 % (ref 22–35)
T4, Total: 7.5 ug/dL (ref 4.9–10.5)
TSH: 2.08 mIU/L (ref 0.40–4.50)

## 2020-03-02 LAB — COMPREHENSIVE METABOLIC PANEL
AG Ratio: 1.4 (calc) (ref 1.0–2.5)
ALT: 37 U/L (ref 9–46)
AST: 49 U/L — ABNORMAL HIGH (ref 10–40)
Albumin: 4.2 g/dL (ref 3.6–5.1)
Alkaline phosphatase (APISO): 35 U/L — ABNORMAL LOW (ref 36–130)
BUN: 12 mg/dL (ref 7–25)
CO2: 23 mmol/L (ref 20–32)
Calcium: 9.6 mg/dL (ref 8.6–10.3)
Chloride: 104 mmol/L (ref 98–110)
Creat: 1.01 mg/dL (ref 0.60–1.35)
Globulin: 3.1 g/dL (calc) (ref 1.9–3.7)
Glucose, Bld: 107 mg/dL — ABNORMAL HIGH (ref 65–99)
Potassium: 4.5 mmol/L (ref 3.5–5.3)
Sodium: 139 mmol/L (ref 135–146)
Total Bilirubin: 0.4 mg/dL (ref 0.2–1.2)
Total Protein: 7.3 g/dL (ref 6.1–8.1)

## 2020-03-02 LAB — CBC WITH DIFFERENTIAL/PLATELET
Absolute Monocytes: 360 cells/uL (ref 200–950)
Basophils Absolute: 19 cells/uL (ref 0–200)
Basophils Relative: 0.4 %
Eosinophils Absolute: 82 cells/uL (ref 15–500)
Eosinophils Relative: 1.7 %
HCT: 41.6 % (ref 38.5–50.0)
Hemoglobin: 13.8 g/dL (ref 13.2–17.1)
Lymphs Abs: 1819 cells/uL (ref 850–3900)
MCH: 31.2 pg (ref 27.0–33.0)
MCHC: 33.2 g/dL (ref 32.0–36.0)
MCV: 93.9 fL (ref 80.0–100.0)
MPV: 10.7 fL (ref 7.5–12.5)
Monocytes Relative: 7.5 %
Neutro Abs: 2520 cells/uL (ref 1500–7800)
Neutrophils Relative %: 52.5 %
Platelets: 272 10*3/uL (ref 140–400)
RBC: 4.43 10*6/uL (ref 4.20–5.80)
RDW: 12.6 % (ref 11.0–15.0)
Total Lymphocyte: 37.9 %
WBC: 4.8 10*3/uL (ref 3.8–10.8)

## 2020-03-02 LAB — HEMOGLOBIN A1C
Hgb A1c MFr Bld: 5.3 % of total Hgb (ref <5.7)
Mean Plasma Glucose: 105 (calc)
eAG (mmol/L): 5.8 (calc)

## 2020-03-02 LAB — LIPID PANEL
Cholesterol: 180 mg/dL (ref <200)
HDL: 43 mg/dL (ref >=40)
LDL Cholesterol (Calc): 117 mg/dL (calc) — ABNORMAL HIGH
Non-HDL Cholesterol (Calc): 137 mg/dL (calc) — ABNORMAL HIGH (ref <130)
Total CHOL/HDL Ratio: 4.2 (calc) (ref <5.0)
Triglycerides: 94 mg/dL (ref <150)

## 2020-03-02 LAB — VITAMIN D 25 HYDROXY (VIT D DEFICIENCY, FRACTURES): Vit D, 25-Hydroxy: 46 ng/mL (ref 30–100)

## 2020-03-02 LAB — HIGH SENSITIVITY CRP: hs-CRP: 5.1 mg/L — ABNORMAL HIGH

## 2020-03-02 LAB — PSA, TOTAL AND FREE
PSA, % Free: 25 % (calc) — ABNORMAL LOW (ref >25)
PSA, Free: 0.5 ng/mL
PSA, Total: 2 ng/mL (ref <=4.0)

## 2020-03-02 NOTE — Telephone Encounter (Signed)
Labs show:  Vitamin D level looks good.  CBC looks normal.  Blood glucose is elevated in prediabetes range at 107.  Very important to minimize intake of processed carbs and sweets.  Recheck in about 6 months.  A1C still looks ok.  CRP is up again.  Not sure what's causing it, but inflammation is a risk factor for heart disease.  Often it can be improved with healthier lifestyle.  Would recheck in 4-6 months and if still up, will do additional testing.  Lipids will improve with lifestyle change.  Liver enzymes are stable.  PSA and thyroid look fine.  Testosterone is pending.

## 2020-03-07 ENCOUNTER — Telehealth: Payer: Self-pay

## 2020-03-07 NOTE — Telephone Encounter (Signed)
Submitted VOB, Durolane, bilateral knee. 

## 2020-03-25 ENCOUNTER — Telehealth: Payer: Self-pay | Admitting: Family Medicine

## 2020-03-25 NOTE — Telephone Encounter (Signed)
Received a vm from [facility] w/ [facility] checking on a request for records, stating has requested twice. No request has been received, I tried calling number left [phone], did not ring, only silence.

## 2020-04-01 ENCOUNTER — Telehealth: Payer: Self-pay

## 2020-04-01 NOTE — Telephone Encounter (Signed)
Approved, Durolane, bilateral knee. Buy & Bill Must meet deductible first Patient will be responsible for 20% OOP. Co-pay of $50.00 No PA required  Appt. 04/07/2020

## 2020-04-07 ENCOUNTER — Encounter: Payer: Self-pay | Admitting: Family Medicine

## 2020-04-07 ENCOUNTER — Other Ambulatory Visit: Payer: Self-pay

## 2020-04-07 ENCOUNTER — Ambulatory Visit: Payer: 59 | Admitting: Family Medicine

## 2020-04-07 DIAGNOSIS — M1711 Unilateral primary osteoarthritis, right knee: Secondary | ICD-10-CM

## 2020-04-07 DIAGNOSIS — M1712 Unilateral primary osteoarthritis, left knee: Secondary | ICD-10-CM | POA: Diagnosis not present

## 2020-04-07 NOTE — Progress Notes (Signed)
   Office Visit Note   Patient: Roger Pace           Date of Birth: 12-22-1970           MRN: [id number] Visit Date: 04/07/2020 Requested by: Lavada Mesi, MD [address] [place],  [place] [address] PCP: Lavada Mesi, MD  Subjective: Chief Complaint  Patient presents with  . Right Knee - Pain  . Left Knee - Pain    HPI: He is here for planned bilateral knee Durolane injections.  Dextrose prolotherapy helped a little, but not as much as these do.              ROS:   All other systems were reviewed and are negative.  Objective: Vital Signs: There were no vitals taken for this visit.  Physical Exam:  General:  Alert and oriented, in no acute distress. Pulm:  Breathing unlabored. Psy:  Normal mood, congruent affect. Skin: No erythema Knees: 1+ effusion on the right, trace on the left.  No warmth.  Full range of motion.  Imaging: No results found.  Assessment & Plan: 1.  Bilateral knee osteoarthritis -Durolane injections today.  Repeat in the future as needed.     Procedures: Bilateral knee injections: After sterile prep with Betadine, injected 3 cc 1% lidocaine without epinephrine and Durolane from lateral midpatellar approach, a flash of clear yellow synovial fluid obtained prior to injections.    PMFS History: Patient Active Problem List   Diagnosis Date Noted  . Class 2 obesity without serious comorbidity with body mass index (BMI) of 38.0 to 38.9 in adult 12/22/2018  . Elevated LFTs 12/22/2018  . Erectile dysfunction 12/22/2018   History reviewed. No pertinent past medical history.  Family History  Problem Relation Age of Onset  . Hypertension Mother   . Irregular heart beat Mother   . Osteoarthritis Mother        Knees  . Diabetes Father   . Hypertension Father   . Other Father        Elevated LFT  . Prostate cancer Father   . Cancer Father   . Leukemia Sister   . Cancer Sister   . Prostate cancer Paternal Uncle   . Cancer Paternal Uncle    . Prostate cancer Maternal Uncle   . Cancer Maternal Uncle   . Arrhythmia Brother   . Hyperlipidemia Brother   . Hypertension Brother     Past Surgical History:  Procedure Laterality Date  . KNEE SURGERY    . SHOULDER SURGERY     Social History   Occupational History  . Not on file  Tobacco Use  . Smoking status: Never Smoker  . Smokeless tobacco: Never Used  Substance and Sexual Activity  . Alcohol use: No  . Drug use: No  . Sexual activity: Not on file

## 2020-04-27 ENCOUNTER — Other Ambulatory Visit: Payer: Self-pay | Admitting: Orthopaedic Surgery

## 2020-04-27 NOTE — Telephone Encounter (Signed)
Can you advise since you have seen him most recent?

## 2020-07-28 ENCOUNTER — Encounter: Payer: Self-pay | Admitting: Family Medicine

## 2020-07-28 MED ORDER — PREDNISONE 10 MG PO TABS
ORAL_TABLET | ORAL | 0 refills | Status: DC
Start: 2020-07-28 — End: 2021-03-17

## 2020-07-29 ENCOUNTER — Other Ambulatory Visit: Payer: Self-pay | Admitting: Family Medicine

## 2020-10-31 ENCOUNTER — Other Ambulatory Visit: Payer: Self-pay | Admitting: Family Medicine

## 2020-12-19 ENCOUNTER — Encounter: Payer: Self-pay | Admitting: Family Medicine

## 2020-12-19 MED ORDER — CELECOXIB 200 MG PO CAPS: 200.0000 mg | ORAL_CAPSULE | Freq: Two times a day (BID) | ORAL | 6 refills | Status: DC | PRN

## 2021-03-08 ENCOUNTER — Telehealth: Payer: Self-pay

## 2021-03-08 NOTE — Telephone Encounter (Signed)
Would like to receive bilateral Durolane injections when he comes in for his CPE on 03/17/21. OK to order?

## 2021-03-09 ENCOUNTER — Telehealth: Payer: Self-pay

## 2021-03-09 NOTE — Telephone Encounter (Signed)
Noted  

## 2021-03-09 NOTE — Telephone Encounter (Signed)
VOB submitted for Durolane, bilateral knee. Pending BV. 

## 2021-03-10 ENCOUNTER — Telehealth: Payer: Self-pay

## 2021-03-10 NOTE — Telephone Encounter (Signed)
Approved for Durolane, bilateral knee. Buy & Bill Must meet deductible first Patient will be responsible for 20% OOP. May have a Co-pay of $25.00 -$50.00 No PA required  Appt. 03/17/2021

## 2021-03-17 ENCOUNTER — Encounter: Payer: Self-pay | Admitting: Family Medicine

## 2021-03-17 ENCOUNTER — Ambulatory Visit (INDEPENDENT_AMBULATORY_CARE_PROVIDER_SITE_OTHER): Payer: 59 | Admitting: Family Medicine

## 2021-03-17 ENCOUNTER — Other Ambulatory Visit: Payer: Self-pay

## 2021-03-17 VITALS — BP 116/75 | HR 75 | Ht 71.25 in | Wt 290.6 lb

## 2021-03-17 DIAGNOSIS — E669 Obesity, unspecified: Secondary | ICD-10-CM

## 2021-03-17 DIAGNOSIS — Z1211 Encounter for screening for malignant neoplasm of colon: Secondary | ICD-10-CM

## 2021-03-17 DIAGNOSIS — Z6838 Body mass index (BMI) 38.0-38.9, adult: Secondary | ICD-10-CM

## 2021-03-17 DIAGNOSIS — Z8042 Family history of malignant neoplasm of prostate: Secondary | ICD-10-CM

## 2021-03-17 DIAGNOSIS — E785 Hyperlipidemia, unspecified: Secondary | ICD-10-CM

## 2021-03-17 DIAGNOSIS — R7989 Other specified abnormal findings of blood chemistry: Secondary | ICD-10-CM

## 2021-03-17 DIAGNOSIS — R5383 Other fatigue: Secondary | ICD-10-CM

## 2021-03-17 DIAGNOSIS — E559 Vitamin D deficiency, unspecified: Secondary | ICD-10-CM

## 2021-03-17 DIAGNOSIS — M1711 Unilateral primary osteoarthritis, right knee: Secondary | ICD-10-CM

## 2021-03-17 DIAGNOSIS — R739 Hyperglycemia, unspecified: Secondary | ICD-10-CM

## 2021-03-17 DIAGNOSIS — Z0000 Encounter for general adult medical examination without abnormal findings: Secondary | ICD-10-CM | POA: Diagnosis not present

## 2021-03-17 DIAGNOSIS — R7982 Elevated C-reactive protein (CRP): Secondary | ICD-10-CM

## 2021-03-17 DIAGNOSIS — M1712 Unilateral primary osteoarthritis, left knee: Secondary | ICD-10-CM | POA: Diagnosis not present

## 2021-03-17 NOTE — Progress Notes (Signed)
Office Visit Note   Patient: Roger Pace           Date of Birth: July 17, 1971           MRN: [id number] Visit Date: 03/17/2021 Requested by: Lavada Mesi, MD [address] [place],  [place] [address] PCP: Lavada Mesi, MD  Subjective: Chief Complaint  Patient presents with   Annual Exam    Bilateral Durolane injections    HPI: He is here for wellness examination.  Overall he is doing pretty well.  His father passed away this year of cancer.  His knees are bothering him again and we will be doing Durolane injections today.  The injections still help, but the pain has become more intense and he is contemplating surgery at some point.  He would still like to wait a couple years if possible.  He has not been able to resume a regular exercise regimen.  He lives in [place] and works in [place] so he spends 3 hours/day on his commute.  This does not leave much time for workouts.  He has been more tired than usual in the past several months.  He sleeps a little bit more, but it does not help with his energy.  His libido is okay, but he would like to have his testosterone level checked.  Previous labs were notable for elevated C-reactive protein, elevated blood sugar, and he has chronically elevated liver enzymes since age 38.  He is due for a colonoscopy.  He is up-to-date on eye exams and dental exams.                ROS:   All other systems were reviewed and are negative.  Objective: Vital Signs: BP 116/75 (BP Location: Left Arm, Patient Position: Sitting, Cuff Size: Large)   Pulse 75   Ht 5' 11.25" (1.81 m)   Wt 290 lb 9.6 oz (131.8 kg)   BMI 40.25 kg/m   Physical Exam:  General:  Alert and oriented, in no acute distress. Pulm:  Breathing unlabored. Psy:  Normal mood, congruent affect. Skin: No suspicious lesions HEENT:  [place]/AT, PERRLA, EOM Full, no nystagmus.  Funduscopic examination within normal limits.  No conjunctival erythema.  Tympanic membranes are pearly  gray with normal landmarks.  External ear canals are normal.  Nasal passages are clear.  Oropharynx is clear.  No significant lymphadenopathy.  No thyromegaly or nodules.  2+ carotid pulses without bruits. CV: Regular rate and rhythm without murmurs, rubs, or gallops.  No peripheral edema.  2+ radial and posterior tibial pulses. Lungs: Clear to auscultation throughout with no wheezing or areas of consolidation. Abd: Bowel sounds are active, no hepatosplenomegaly or masses.  Soft and nontender.  No audible bruits.  No evidence of ascites. Knees: 1+ effusion bilaterally with no warmth or erythema.     Imaging: No results found.  Assessment & Plan: Wellness examination -Labs today.  Follow-up yearly. -Colonoscopy referral made.  2.  Bilateral knee osteoarthritis - [place] injections today.  3.  Hyperglycemia, elevated liver enzymes, elevated C-reactive protein. - Recheck labs today.  4.  Fatigue - We will include testosterone levels.     Procedures: No procedures performed        PMFS History: Patient Active Problem List   Diagnosis Date Noted   Class 2 obesity without serious comorbidity with body mass index (BMI) of 38.0 to 38.9 in adult 12/22/2018   Elevated LFTs 12/22/2018   Erectile dysfunction 12/22/2018   History reviewed. No  pertinent past medical history.  Family History  Problem Relation Age of Onset   Hypertension Mother    Irregular heart beat Mother    Osteoarthritis Mother        Knees   Diabetes Father    Hypertension Father    Other Father        Elevated LFT   Prostate cancer Father    Cancer Father    Leukemia Sister    Cancer Sister    Arrhythmia Brother    Hyperlipidemia Brother    Hypertension Brother    Prostate cancer Maternal Uncle    Cancer Maternal Uncle    Prostate cancer Paternal Uncle    Cancer Paternal Uncle     Past Surgical History:  Procedure Laterality Date   KNEE SURGERY     SHOULDER SURGERY     Social History    Occupational History   Not on file  Tobacco Use   Smoking status: Never   Smokeless tobacco: Never  Substance and Sexual Activity   Alcohol use: No   Drug use: No   Sexual activity: Not on file

## 2021-03-18 ENCOUNTER — Telehealth: Payer: Self-pay | Admitting: Family Medicine

## 2021-03-18 LAB — TESTOSTERONE TOTAL,FREE,BIO, MALES
Albumin: 4.4 g/dL (ref 3.6–5.1)
Sex Hormone Binding: 33 nmol/L (ref 10–50)
Testosterone, Bioavailable: 113 ng/dL (ref 110.0–575.0)
Testosterone, Free: 56.1 pg/mL (ref 46.0–224.0)
Testosterone: 423 ng/dL (ref 250–827)

## 2021-03-18 LAB — HEPATIC FUNCTION PANEL
AG Ratio: 1.5 (calc) (ref 1.0–2.5)
ALT: 38 U/L (ref 9–46)
AST: 59 U/L — ABNORMAL HIGH (ref 10–35)
Albumin: 4.4 g/dL (ref 3.6–5.1)
Alkaline phosphatase (APISO): 33 U/L — ABNORMAL LOW (ref 35–144)
Bilirubin, Direct: 0.1 mg/dL (ref 0.0–0.2)
Globulin: 3 g/dL (calc) (ref 1.9–3.7)
Indirect Bilirubin: 0.5 mg/dL (calc) (ref 0.2–1.2)
Total Bilirubin: 0.6 mg/dL (ref 0.2–1.2)
Total Protein: 7.4 g/dL (ref 6.1–8.1)

## 2021-03-18 LAB — LIPID PANEL
Cholesterol: 188 mg/dL (ref <200)
HDL: 43 mg/dL (ref >=40)
LDL Cholesterol (Calc): 125 mg/dL (calc) — ABNORMAL HIGH
Non-HDL Cholesterol (Calc): 145 mg/dL (calc) — ABNORMAL HIGH (ref <130)
Total CHOL/HDL Ratio: 4.4 (calc) (ref <5.0)
Triglycerides: 97 mg/dL (ref <150)

## 2021-03-18 LAB — CBC WITH DIFFERENTIAL/PLATELET
Absolute Monocytes: 330 cells/uL (ref 200–950)
Basophils Absolute: 11 cells/uL (ref 0–200)
Basophils Relative: 0.2 %
Eosinophils Absolute: 39 cells/uL (ref 15–500)
Eosinophils Relative: 0.7 %
HCT: 41.8 % (ref 38.5–50.0)
Hemoglobin: 13.7 g/dL (ref 13.2–17.1)
Lymphs Abs: 2195 cells/uL (ref 850–3900)
MCH: 30.9 pg (ref 27.0–33.0)
MCHC: 32.8 g/dL (ref 32.0–36.0)
MCV: 94.1 fL (ref 80.0–100.0)
MPV: 10.5 fL (ref 7.5–12.5)
Monocytes Relative: 5.9 %
Neutro Abs: 3024 cells/uL (ref 1500–7800)
Neutrophils Relative %: 54 %
Platelets: 271 10*3/uL (ref 140–400)
RBC: 4.44 10*6/uL (ref 4.20–5.80)
RDW: 12.7 % (ref 11.0–15.0)
Total Lymphocyte: 39.2 %
WBC: 5.6 10*3/uL (ref 3.8–10.8)

## 2021-03-18 LAB — BASIC METABOLIC PANEL
BUN: 11 mg/dL (ref 7–25)
CO2: 25 mmol/L (ref 20–32)
Calcium: 9.3 mg/dL (ref 8.6–10.3)
Chloride: 102 mmol/L (ref 98–110)
Creat: 1.02 mg/dL (ref 0.70–1.30)
Glucose, Bld: 101 mg/dL — ABNORMAL HIGH (ref 65–99)
Potassium: 4 mmol/L (ref 3.5–5.3)
Sodium: 138 mmol/L (ref 135–146)

## 2021-03-18 LAB — THYROID PANEL WITH TSH
Free Thyroxine Index: 2.3 (ref 1.4–3.8)
T3 Uptake: 30 % (ref 22–35)
T4, Total: 7.7 ug/dL (ref 4.9–10.5)
TSH: 1.86 mIU/L (ref 0.40–4.50)

## 2021-03-18 LAB — VITAMIN D 25 HYDROXY (VIT D DEFICIENCY, FRACTURES): Vit D, 25-Hydroxy: 52 ng/mL (ref 30–100)

## 2021-03-18 LAB — HEMOGLOBIN A1C
Hgb A1c MFr Bld: 5.5 % of total Hgb (ref <5.7)
Mean Plasma Glucose: 111 mg/dL
eAG (mmol/L): 6.2 mmol/L

## 2021-03-18 LAB — HIGH SENSITIVITY CRP: hs-CRP: 4.4 mg/L — ABNORMAL HIGH

## 2021-03-18 LAB — PSA: PSA: 2.22 ng/mL (ref <=4.00)

## 2021-03-18 NOTE — Telephone Encounter (Signed)
Labs show:  Testosterone levels are sufficient and should improve with weight loss and exercise.  Blood glucose in prediabetes range, but A1C still normal.  Again, will improve with weight loss, diet and exercise.  CRP (inflammation) is again elevated.  Recheck in 6 months and if still up, we'll do some additional testing.  Lipids will improve with lifestyle change.  All else stable/normal.

## 2021-03-21 ENCOUNTER — Encounter: Payer: Self-pay | Admitting: Family Medicine

## 2021-12-04 ENCOUNTER — Encounter: Payer: Self-pay | Admitting: Physician Assistant

## 2021-12-04 ENCOUNTER — Ambulatory Visit: Payer: Self-pay

## 2021-12-04 ENCOUNTER — Ambulatory Visit (INDEPENDENT_AMBULATORY_CARE_PROVIDER_SITE_OTHER): Payer: 59

## 2021-12-04 ENCOUNTER — Ambulatory Visit: Payer: 59 | Admitting: Physician Assistant

## 2021-12-04 VITALS — Ht 71.25 in | Wt 287.4 lb

## 2021-12-04 DIAGNOSIS — M1711 Unilateral primary osteoarthritis, right knee: Secondary | ICD-10-CM

## 2021-12-04 DIAGNOSIS — M1712 Unilateral primary osteoarthritis, left knee: Secondary | ICD-10-CM

## 2021-12-04 NOTE — Progress Notes (Signed)
? ?Office Visit Note ?  ?Patient: Roger Pace           ?Date of Birth: Feb 03, 1971           ?MRN: [id number] ?Visit Date: 12/04/2021 ?             ?Requested by: Eunice Blase, MD ?[address] ?[place],  [place] [address] ?PCP: Eunice Blase, MD ? ? ?Assessment & Plan: ?Visit Diagnoses:  ?1. Arthritis of knee, left   ?2. Arthritis of knee, right   ? ? ?Plan: This point time he would like to try repeat supplemental injections.  We will have these approved and have him back once these are available.  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  Questions were encouraged and answered at length. ? ?Follow-Up Instructions: Return for Supplemental injection.  ? ?Orders:  ?Orders Placed This Encounter  ?Procedures  ? XR Knee 1-2 Views Right  ? XR Knee 1-2 Views Left  ? ?No orders of the defined types were placed in this encounter. ? ? ? ? Procedures: ?No procedures performed ? ? ?Clinical Data: ?No additional findings. ? ? ?Subjective: ?Chief Complaint  ?Patient presents with  ? Right Knee - Pain  ? Left Knee - Pain  ? ? ?HPI ?Roger Pace comes in today for bilateral knee pain.  He was last seen by Dr. Junius Pace on 04/07/2020 was given Durolane injections both knees.  He states this gave him some relief for about a month and a half or so.  Said increased pain in both knees.  He is having pain such that he even has pain with doing the elliptical or stationary bike but finds a recumbent bike to be less painful for exercise.  His right knee pain is worse than his left.  He takes Celebrex and feels this helps some.  He has had some quad tendon calcification and weakness and is concerned about doing cortisone injections in either knee.  He has had no new injury to either knee. ?Review of Systems ?See HPI otherwise negative ? ?Objective: ?Vital Signs: Ht 5' 11.25" (1.81 m)   Wt 287 lb 6.4 oz (130.4 kg)   BMI 39.80 kg/m?  ? ?Physical Exam ?General: Well-developed well-nourished male no acute distress mood and affect  appropriate. ?Ortho Exam ?Bilateral knees: No abnormal warmth erythema.  Significant crepitus with passive range of motion both knees.  He has full extension full flexion of both knees.  No instability valgus varus stressing of either knee. ?Specialty Comments:  ?No specialty comments available. ? ?Imaging: ?XR Knee 1-2 Views Left ? ?Result Date: 12/04/2021 ?Right knee: No acute fractures.  Near bone-on-bone lateral compartment.  Moderate to moderate severe medial compartment arthritic changes.  Severe patellofemoral arthritic changes.  Retained interference screws from previous ACL repair. ? ?XR Knee 1-2 Views Right ? ?Result Date: 12/04/2021 ?Right knee: No acute fractures.  Near bone-on-bone lateral compartment.  Moderate to moderate severe medial compartment arthritic changes.  Severe patellofemoral arthritic changes.  Retained interference screws from previous ACL repair.  ? ? ?PMFS History: ?Patient Active Problem List  ? Diagnosis Date Noted  ? Class 2 obesity without serious comorbidity with body mass index (BMI) of 38.0 to 38.9 in adult 12/22/2018  ? Elevated LFTs 12/22/2018  ? Erectile dysfunction 12/22/2018  ? ?History reviewed. No pertinent past medical history.  ?Family History  ?Problem Relation Age of Onset  ? Hypertension Mother   ? Irregular heart beat Mother   ? Osteoarthritis Mother   ?  Knees  ? Diabetes Father   ? Hypertension Father   ? Other Father   ?     Elevated LFT  ? Prostate cancer Father   ? Cancer Father   ? Leukemia Sister   ? Cancer Sister   ? Arrhythmia Brother   ? Hyperlipidemia Brother   ? Hypertension Brother   ? Prostate cancer Maternal Uncle   ? Cancer Maternal Uncle   ? Prostate cancer Paternal Uncle   ? Cancer Paternal Uncle   ?  ?Past Surgical History:  ?Procedure Laterality Date  ? KNEE SURGERY    ? SHOULDER SURGERY    ? ?Social History  ? ?Occupational History  ? Not on file  ?Tobacco Use  ? Smoking status: Never  ? Smokeless tobacco: Never  ?Substance and Sexual Activity   ? Alcohol use: No  ? Drug use: No  ? Sexual activity: Not on file  ? ? ? ? ? ? ?

## 2021-12-05 ENCOUNTER — Telehealth: Payer: Self-pay

## 2021-12-05 NOTE — Telephone Encounter (Signed)
Please get auth for bilateral knee gel injections -gil pt 

## 2021-12-05 NOTE — Telephone Encounter (Signed)
Noted  

## 2022-05-07 ENCOUNTER — Other Ambulatory Visit: Payer: Self-pay | Admitting: Family Medicine

## 2022-05-07 DIAGNOSIS — R972 Elevated prostate specific antigen [PSA]: Secondary | ICD-10-CM

## 2022-06-04 ENCOUNTER — Ambulatory Visit
Admission: RE | Admit: 2022-06-04 | Discharge: 2022-06-04 | Disposition: A | Discharge status: Home / Self Care | Payer: No Typology Code available for payment source | Source: Ambulatory Visit | Attending: Family Medicine | Admitting: Family Medicine

## 2022-06-04 DIAGNOSIS — R972 Elevated prostate specific antigen [PSA]: Secondary | ICD-10-CM

## 2022-06-04 MED ORDER — GADOPICLENOL 0.5 MMOL/ML IV SOLN
10.0000 mL | Freq: Once | INTRAVENOUS | Status: AC | PRN
  Administered 2022-06-04: 10 mL via INTRAVENOUS

## 2022-10-25 ENCOUNTER — Encounter: Payer: Self-pay | Admitting: Radiology

## 2023-09-10 ENCOUNTER — Other Ambulatory Visit: Payer: Self-pay | Admitting: General Surgery

## 2023-09-17 ENCOUNTER — Other Ambulatory Visit: Payer: Self-pay

## 2023-09-17 ENCOUNTER — Encounter (HOSPITAL_COMMUNITY): Payer: Self-pay | Admitting: General Surgery

## 2023-09-17 NOTE — Progress Notes (Signed)
PCP - Dr. Lavada Mesi Cardiologist - denies  PPM/ICD - denies   Chest x-ray - 07/31/12 EKG - 12/30/02 (n/a) Stress Test - denies ECHO - denies Cardiac Cath - denies  CPAP - denies  DM- denies  ASA/Blood Thinner Instructions: n/a   ERAS Protcol - clears until 1130  COVID TEST- n/a  Anesthesia review: no  Patient verbally denies any shortness of breath, fever, cough and chest pain during phone call     Questions were answered. Patient verbalized understanding of instructions.

## 2023-09-17 NOTE — Pre-Procedure Instructions (Signed)
-------------    SDW INSTRUCTIONS given:  Your procedure is scheduled on 1/29.  Report to [facility] Main Entrance "A" at 12:00 P.M., and check in at the Admitting office.  Any questions or running late day of surgery: call [phone]    Remember:  Do not eat after midnight the night before your surgery  You may drink clear liquids until 11:30 AM the morning of your surgery.   Clear liquids allowed are: Water, Non-Citrus Juices (without pulp), Carbonated Beverages, Clear Tea, Black Coffee Only, and Gatorade    Take these medicines the morning of surgery with A SIP OF WATER: NONE    As of today, STOP taking any Aspirin (unless otherwise instructed by your surgeon) Aleve, Naproxen, Ibuprofen, Motrin, Advil, Goody's, BC's, all herbal medications, fish oil, and all vitamins.   Do NOT Smoke (Tobacco/Vaping) 24 hours prior to your procedure  If you use a CPAP at night, you may bring all equipment for your overnight stay.     You will be asked to remove any contacts, glasses, piercing's, hearing aid's, dentures/partials prior to surgery. Please bring cases for these items if needed.     Patients discharged the day of surgery will not be allowed to drive home, and someone needs to stay with them for 24 hours.  SURGICAL WAITING ROOM VISITATION Patients may have no more than 2 support people in the waiting area - these visitors may rotate.   Pre-op nurse will coordinate an appropriate time for 1 ADULT support person, who may not rotate, to accompany patient in pre-op.  Children under the age of 63 must have an adult with them who is not the patient and must remain in the main waiting area with an adult.  If the patient needs to stay at the hospital during part of their recovery, the visitor guidelines for inpatient rooms apply.  Please refer to the [facility] website for the visitor guidelines for any additional information.   Special instructions:   [place]- Preparing For  Surgery   Please follow these instructions carefully.   Shower the NIGHT BEFORE SURGERY and the MORNING OF SURGERY with DIAL Soap.   Pat yourself dry with a CLEAN TOWEL.  Wear CLEAN PAJAMAS to bed the night before surgery  Place CLEAN SHEETS on your bed the night of your first shower and DO NOT SLEEP WITH PETS.   Additional instructions for the day of surgery: DO NOT APPLY any lotions, deodorants, cologne, or perfumes.   Do not wear jewelry or makeup Do not wear nail polish, gel polish, artificial nails, or any other type of covering on natural nails (fingers and toes) Do not bring valuables to the hospital. [facility] is not responsible for valuables/personal belongings. Put on clean/comfortable clothes.  Please brush your teeth.  Ask your nurse before applying any prescription medications to the skin.

## 2023-09-18 ENCOUNTER — Encounter (HOSPITAL_COMMUNITY)
Admission: RE | Disposition: A | Discharge status: Home / Self Care | Payer: Self-pay | Source: Home / Self Care | Attending: General Surgery

## 2023-09-18 ENCOUNTER — Ambulatory Visit (HOSPITAL_COMMUNITY)
Admission: RE | Admit: 2023-09-18 | Discharge: 2023-09-18 | Disposition: A | Discharge status: Home / Self Care | Payer: 59 | Attending: General Surgery | Admitting: General Surgery

## 2023-09-18 ENCOUNTER — Ambulatory Visit (HOSPITAL_COMMUNITY): Payer: 59 | Admitting: Anesthesiology

## 2023-09-18 ENCOUNTER — Other Ambulatory Visit: Payer: Self-pay

## 2023-09-18 DIAGNOSIS — K429 Umbilical hernia without obstruction or gangrene: Secondary | ICD-10-CM | POA: Diagnosis present

## 2023-09-18 HISTORY — PX: UMBILICAL HERNIA REPAIR: SHX196

## 2023-09-18 HISTORY — PX: LAPAROSCOPY: SHX197

## 2023-09-18 HISTORY — DX: Unspecified osteoarthritis, unspecified site: M19.90

## 2023-09-18 LAB — CBC
HCT: 41.8 % (ref 39.0–52.0)
Hemoglobin: 14.2 g/dL (ref 13.0–17.0)
MCH: 31.5 pg (ref 26.0–34.0)
MCHC: 34 g/dL (ref 30.0–36.0)
MCV: 92.7 fL (ref 80.0–100.0)
Platelets: 279 10*3/uL (ref 150–400)
RBC: 4.51 MIL/uL (ref 4.22–5.81)
RDW: 13 % (ref 11.5–15.5)
WBC: 5 10*3/uL (ref 4.0–10.5)
nRBC: 0 % (ref 0.0–0.2)

## 2023-09-18 LAB — SURGICAL PCR SCREEN
MRSA, PCR: NEGATIVE
Staphylococcus aureus: NEGATIVE

## 2023-09-18 SURGERY — REPAIR, HERNIA, UMBILICAL, ADULT
Anesthesia: General | Site: Abdomen

## 2023-09-18 MED ORDER — CHLORHEXIDINE GLUCONATE CLOTH 2 % EX PADS: 6.0000 | MEDICATED_PAD | Freq: Once | CUTANEOUS | Status: DC

## 2023-09-18 MED ORDER — ROPIVACAINE HCL 5 MG/ML IJ SOLN
INTRAMUSCULAR | Status: DC | PRN
  Administered 2023-09-18: 40 mL via PERINEURAL

## 2023-09-18 MED ORDER — PROPOFOL 10 MG/ML IV BOLUS
INTRAVENOUS | Status: AC
  Filled 2023-09-18: qty 20

## 2023-09-18 MED ORDER — DEXAMETHASONE SODIUM PHOSPHATE 10 MG/ML IJ SOLN
INTRAMUSCULAR | Status: DC | PRN
  Administered 2023-09-18: 10 mg via INTRAVENOUS

## 2023-09-18 MED ORDER — FENTANYL CITRATE (PF) 250 MCG/5ML IJ SOLN
INTRAMUSCULAR | Status: AC
  Filled 2023-09-18: qty 5

## 2023-09-18 MED ORDER — BUPIVACAINE-EPINEPHRINE 0.25% -1:200000 IJ SOLN
INTRAMUSCULAR | Status: DC | PRN
  Administered 2023-09-18: 3 mL

## 2023-09-18 MED ORDER — CEFAZOLIN SODIUM-DEXTROSE 3-4 GM/150ML-% IV SOLN
3.0000 g | INTRAVENOUS | Status: AC
  Administered 2023-09-18: 3 g via INTRAVENOUS
  Filled 2023-09-18: qty 150

## 2023-09-18 MED ORDER — ENSURE PRE-SURGERY PO LIQD: 296.0000 mL | Freq: Once | ORAL | Status: DC

## 2023-09-18 MED ORDER — MIDAZOLAM HCL 2 MG/2ML IJ SOLN
INTRAMUSCULAR | Status: AC
  Filled 2023-09-18: qty 2

## 2023-09-18 MED ORDER — TRAMADOL HCL 50 MG PO TABS: 50.0000 mg | ORAL_TABLET | Freq: Four times a day (QID) | ORAL | 0 refills | Status: DC | PRN

## 2023-09-18 MED ORDER — MIDAZOLAM HCL 2 MG/2ML IJ SOLN
INTRAMUSCULAR | Status: AC
  Administered 2023-09-18: 2 mg via INTRAVENOUS
  Filled 2023-09-18: qty 2

## 2023-09-18 MED ORDER — FENTANYL CITRATE (PF) 100 MCG/2ML IJ SOLN
INTRAMUSCULAR | Status: AC
  Administered 2023-09-18: 100 ug via INTRAVENOUS
  Filled 2023-09-18: qty 2

## 2023-09-18 MED ORDER — CHLORHEXIDINE GLUCONATE CLOTH 2 % EX PADS
6.0000 | MEDICATED_PAD | Freq: Once | CUTANEOUS | Status: AC
  Administered 2023-09-18: 6 via TOPICAL

## 2023-09-18 MED ORDER — FENTANYL CITRATE (PF) 100 MCG/2ML IJ SOLN: 100.0000 ug | Freq: Once | INTRAMUSCULAR | Status: AC

## 2023-09-18 MED ORDER — ONDANSETRON HCL 4 MG/2ML IJ SOLN
INTRAMUSCULAR | Status: AC
  Filled 2023-09-18: qty 2

## 2023-09-18 MED ORDER — PROPOFOL 10 MG/ML IV BOLUS
INTRAVENOUS | Status: DC | PRN
  Administered 2023-09-18: 200 mg via INTRAVENOUS

## 2023-09-18 MED ORDER — HYDROMORPHONE HCL 1 MG/ML IJ SOLN: 0.2500 mg | INTRAMUSCULAR | Status: DC | PRN

## 2023-09-18 MED ORDER — AMISULPRIDE (ANTIEMETIC) 5 MG/2ML IV SOLN: 10.0000 mg | Freq: Once | INTRAVENOUS | Status: DC | PRN

## 2023-09-18 MED ORDER — OXYCODONE HCL 5 MG/5ML PO SOLN: 5.0000 mg | Freq: Once | ORAL | Status: DC | PRN

## 2023-09-18 MED ORDER — OXYCODONE HCL 5 MG PO TABS: 5.0000 mg | ORAL_TABLET | Freq: Once | ORAL | Status: DC | PRN

## 2023-09-18 MED ORDER — MUPIROCIN 2 % EX OINT
TOPICAL_OINTMENT | CUTANEOUS | Status: AC
  Filled 2023-09-18: qty 22

## 2023-09-18 MED ORDER — DEXMEDETOMIDINE HCL IN NACL 80 MCG/20ML IV SOLN
INTRAVENOUS | Status: DC | PRN
  Administered 2023-09-18: 4 ug via INTRAVENOUS
  Administered 2023-09-18: 8 ug via INTRAVENOUS

## 2023-09-18 MED ORDER — ONDANSETRON HCL 4 MG/2ML IJ SOLN
INTRAMUSCULAR | Status: DC | PRN
  Administered 2023-09-18: 4 mg via INTRAVENOUS

## 2023-09-18 MED ORDER — SODIUM CHLORIDE 0.9 % IV SOLN: 12.5000 mg | INTRAVENOUS | Status: DC | PRN

## 2023-09-18 MED ORDER — ROCURONIUM BROMIDE 10 MG/ML (PF) SYRINGE
PREFILLED_SYRINGE | INTRAVENOUS | Status: AC
  Filled 2023-09-18: qty 10

## 2023-09-18 MED ORDER — MIDAZOLAM HCL 2 MG/2ML IJ SOLN: 2.0000 mg | Freq: Once | INTRAMUSCULAR | Status: AC

## 2023-09-18 MED ORDER — LACTATED RINGERS IV SOLN: INTRAVENOUS | Status: DC

## 2023-09-18 MED ORDER — CEFAZOLIN SODIUM 1 G IJ SOLR
INTRAMUSCULAR | Status: AC
  Filled 2023-09-18: qty 10

## 2023-09-18 MED ORDER — SUGAMMADEX SODIUM 200 MG/2ML IV SOLN
INTRAVENOUS | Status: DC | PRN
  Administered 2023-09-18: 200 mg via INTRAVENOUS
  Administered 2023-09-18 (×2): 100 mg via INTRAVENOUS

## 2023-09-18 MED ORDER — MEPERIDINE HCL 25 MG/ML IJ SOLN: 6.2500 mg | INTRAMUSCULAR | Status: DC | PRN

## 2023-09-18 MED ORDER — CHLORHEXIDINE GLUCONATE 0.12 % MT SOLN
15.0000 mL | Freq: Once | OROMUCOSAL | Status: AC
Start: 2023-09-18 — End: 2023-09-18
  Administered 2023-09-18: 15 mL via OROMUCOSAL
  Filled 2023-09-18: qty 15

## 2023-09-18 MED ORDER — DEXAMETHASONE SODIUM PHOSPHATE 10 MG/ML IJ SOLN
INTRAMUSCULAR | Status: AC
  Filled 2023-09-18: qty 1

## 2023-09-18 MED ORDER — ORAL CARE MOUTH RINSE: 15.0000 mL | Freq: Once | OROMUCOSAL | Status: AC

## 2023-09-18 MED ORDER — ACETAMINOPHEN 500 MG PO TABS
1000.0000 mg | ORAL_TABLET | ORAL | Status: AC
  Administered 2023-09-18: 1000 mg via ORAL
  Filled 2023-09-18: qty 2

## 2023-09-18 MED ORDER — ROCURONIUM BROMIDE 100 MG/10ML IV SOLN
INTRAVENOUS | Status: DC | PRN
  Administered 2023-09-18: 80 mg via INTRAVENOUS

## 2023-09-18 MED ORDER — LIDOCAINE 2% (20 MG/ML) 5 ML SYRINGE
INTRAMUSCULAR | Status: AC
  Filled 2023-09-18: qty 5

## 2023-09-18 MED ORDER — 0.9 % SODIUM CHLORIDE (POUR BTL) OPTIME
TOPICAL | Status: DC | PRN
  Administered 2023-09-18: 1000 mL

## 2023-09-18 MED ORDER — BUPIVACAINE-EPINEPHRINE (PF) 0.25% -1:200000 IJ SOLN
INTRAMUSCULAR | Status: AC
  Filled 2023-09-18: qty 30

## 2023-09-18 SURGICAL SUPPLY — 33 items
BAG COUNTER SPONGE SURGICOUNT (BAG) ×2 IMPLANT
BLADE CLIPPER SURG (BLADE) IMPLANT
CANISTER SUCT 3000ML PPV (MISCELLANEOUS) IMPLANT
CHLORAPREP W/TINT 26 (MISCELLANEOUS) ×2 IMPLANT
COVER SURGICAL LIGHT HANDLE (MISCELLANEOUS) ×2 IMPLANT
DERMABOND ADVANCED .7 DNX12 (GAUZE/BANDAGES/DRESSINGS) ×2 IMPLANT
DRAPE LAPAROSCOPIC ABDOMINAL (DRAPES) ×2 IMPLANT
ELECT CAUTERY BLADE 6.4 (BLADE) ×2 IMPLANT
ELECT REM PT RETURN 9FT ADLT (ELECTROSURGICAL) ×1
ELECTRODE REM PT RTRN 9FT ADLT (ELECTROSURGICAL) ×2 IMPLANT
GLOVE BIO SURGEON STRL SZ7 (GLOVE) ×2 IMPLANT
GLOVE BIOGEL PI IND STRL 7.5 (GLOVE) ×2 IMPLANT
GOWN STRL REUS W/ TWL LRG LVL3 (GOWN DISPOSABLE) ×6 IMPLANT
KIT BASIN OR (CUSTOM PROCEDURE TRAY) ×2 IMPLANT
KIT TURNOVER KIT B (KITS) ×2 IMPLANT
NDL HYPO 25GX1X1/2 BEV (NEEDLE) ×2 IMPLANT
NEEDLE HYPO 25GX1X1/2 BEV (NEEDLE) ×1
NS IRRIG 1000ML POUR BTL (IV SOLUTION) ×2 IMPLANT
PAD ARMBOARD 7.5X6 YLW CONV (MISCELLANEOUS) ×2 IMPLANT
PENCIL SMOKE EVACUATOR (MISCELLANEOUS) ×2 IMPLANT
SET TUBE SMOKE EVAC HIGH FLOW (TUBING) ×2 IMPLANT
SLEEVE Z-THREAD 5X100MM (TROCAR) ×2 IMPLANT
STRIP CLOSURE SKIN 1/2X4 (GAUZE/BANDAGES/DRESSINGS) ×2 IMPLANT
SUT ETHIBOND 0 MO6 C/R (SUTURE) IMPLANT
SUT MNCRL AB 4-0 PS2 18 (SUTURE) ×2 IMPLANT
SUT VIC AB 2-0 CT1 TAPERPNT 27 (SUTURE) ×2 IMPLANT
SUT VIC AB 3-0 SH 27X BRD (SUTURE) IMPLANT
SYR CONTROL 10ML LL (SYRINGE) ×2 IMPLANT
TOWEL GREEN STERILE (TOWEL DISPOSABLE) ×2 IMPLANT
TOWEL GREEN STERILE FF (TOWEL DISPOSABLE) ×2 IMPLANT
TRAY LAPAROSCOPIC MC (CUSTOM PROCEDURE TRAY) ×2 IMPLANT
TROCAR Z-THREAD OPTICAL 5X100M (TROCAR) ×2 IMPLANT
WARMER LAPAROSCOPE (MISCELLANEOUS) ×2 IMPLANT

## 2023-09-18 NOTE — Interval H&P Note (Signed)
History and Physical Interval Note:  09/18/2023 1:19 PM  Roger Pace  has presented today for surgery, with the diagnosis of umbilical hernia.  The various methods of treatment have been discussed with the patient and family. After consideration of risks, benefits and other options for treatment, the patient has consented to  Procedure(s) with comments: UMBILICAL HERNIA REPAIR, POSSIBLE MESH (N/A) - RNFA GEN & TAP BLOCK DIAGNOSTIC LAPAROSCOPY (N/A) as a surgical intervention.  The patient's history has been reviewed, patient examined, no change in status, stable for surgery.  I have reviewed the patient's chart and labs.  Questions were answered to the patient's satisfaction.     [name]

## 2023-09-18 NOTE — H&P (Signed)
53 y.o. 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 The hernia is causing a bulge and pain, which is exacerbated when wearing a duty belt or after eating large meals. The patient reports that the bulge is present all the time and does not recede. The patient has a history of knee and shoulder surgeries and is currently taking Celebrex for pain. The patient also mentions plans for double knee replacement due to ongoing issues.  Review of Systems  Gastrointestinal: Positive for abdominal pain.  All other systems reviewed and are negative.  There is no problem list on file for this patient.  Outpatient Medications Prior to Visit  Medication Sig Dispense Refill  celecoxib (CELEBREX) 200 MG capsule take 1 capsule by mouth twice a day as needed   No facility-administered medications prior to visit.    Objective   Vitals:  09/10/23 0936  BP: (!) 145/84  Pulse: 85  Temp: 36.7 C (98.1 F)  SpO2: 99%  Weight: (!) 127.1 kg (280 lb 3.2 oz)  Height: 181.6 cm (5' 11.5")  PainSc: 1   Body mass index is 38.54 kg/m.  Home Vitals:   Physical Exam Vitals reviewed.  Constitutional:  Appearance: Normal appearance.  Neurological:  Mental Status: He is alert.   Physical Exam ABDOMEN: Small umbilical hernia noted above the belly button, reducible with palpation. Diastasis noted from the belly button to the sternum, associated with muscle spreading.  Results    Assessment/Plan:   Assessment & Plan Umbilical Hernia Persistent bulge and pain, exacerbated by physical activity and eating. Discussed the risks/benefits of surgical repair with mesh vs sutures. Noted the presence of diastasis recti, which will not be repaired. -Schedule surgical repair within the next month. -If hernia is over 1 cm, repair with mesh. If under 1 cm, repair with  sutures. -Post-operative plan includes 4 weeks of no lifting more than 10-15 pounds, with a total of 6 weeks before returning to full duty. -discussed risk of surgery including but not limited to bleeding, infection, recurrence and injury to surrounding structures requiring repair  Diastasis Recti Noted above the hernia. Discussed that this will not be repaired during the hernia surgery.

## 2023-09-18 NOTE — Anesthesia Postprocedure Evaluation (Signed)
Anesthesia Post Note  Patient: Roger Pace  Procedure(s) Performed: UMBILICAL HERNIA REPAIR (Abdomen) DIAGNOSTIC LAPAROSCOPY (Abdomen)     Patient location during evaluation: PACU Anesthesia Type: General Level of consciousness: awake and alert Pain management: pain level controlled Vital Signs Assessment: post-procedure vital signs reviewed and stable Respiratory status: spontaneous breathing, nonlabored ventilation and respiratory function stable Cardiovascular status: blood pressure returned to baseline and stable Postop Assessment: no apparent nausea or vomiting Anesthetic complications: no   No notable events documented.  Last Vitals:  Vitals:   09/18/23 1445 09/18/23 1500  BP: 132/86 139/85  Pulse: 79 79  Resp: 13 (!) 24  Temp:  36.6 C  SpO2: 95% 94%                    [name]

## 2023-09-18 NOTE — Anesthesia Procedure Notes (Signed)
Anesthesia Regional Block: TAP block   Pre-Anesthetic Checklist: , timeout performed,  Correct Patient, Correct Site, Correct Laterality,  Correct Procedure, Correct Position, site marked,  Risks and benefits discussed,  Surgical consent,  Pre-op evaluation,  At surgeon's request and post-op pain management  Laterality: Left and Right  Prep: chloraprep       Needles:  Injection technique: Single-shot  Needle Type: Stimiplex     Needle Length: 9cm  Needle Gauge: 21     Additional Needles:   Procedures:,,,, ultrasound used (permanent image in chart),,    Narrative:  Start time: 09/18/2023 1:32 PM End time: 09/18/2023 1:37 PM Injection made incrementally with aspirations every 5 mL.  Performed by: Personally  Anesthesiologist: Lowella Curb, MD

## 2023-09-18 NOTE — Anesthesia Procedure Notes (Signed)
Procedure Name: Intubation Date/Time: 09/18/2023 1:54 PM  Performed by: Susy Manor, CRNAPre-anesthesia Checklist: Patient identified, Emergency Drugs available, Suction available and Patient being monitored Patient Re-evaluated:Patient Re-evaluated prior to induction Oxygen Delivery Method: Circle system utilized Preoxygenation: Pre-oxygenation with 100% oxygen Induction Type: IV induction Ventilation: Mask ventilation without difficulty Laryngoscope Size: Mac and 4 Grade View: Grade II Tube type: Oral Tube size: 8.0 mm Number of attempts: 1 Airway Equipment and Method: Stylet and Oral airway Placement Confirmation: ETT inserted through vocal cords under direct vision, positive ETCO2 and breath sounds checked- equal and bilateral Secured at: 23 cm Tube secured with: Tape Dental Injury: Teeth and Oropharynx as per pre-operative assessment

## 2023-09-18 NOTE — Transfer of Care (Signed)
Immediate Anesthesia Transfer of Care Note  Patient: Roger Pace  Procedure(s) Performed: UMBILICAL HERNIA REPAIR (Abdomen) DIAGNOSTIC LAPAROSCOPY (Abdomen)  Patient Location: PACU  Anesthesia Type:General  Level of Consciousness: awake, alert , and oriented  Airway & Oxygen Therapy: Patient Spontanous Breathing and Patient connected to nasal cannula oxygen  Post-op Assessment: Report given to RN and Post -op Vital signs reviewed and stable  Post vital signs: Reviewed and stable  Last Vitals:  Vitals Value Taken Time  BP 141/99 09/18/23 1434  Temp    Pulse 84 09/18/23 1439  Resp 16 09/18/23 1439  SpO2 95 % 09/18/23 1439  Vitals shown include unfiled device data.  Last Pain:  Vitals:   09/18/23 1245  TempSrc:   PainSc: 0-No pain         Complications: No notable events documented.

## 2023-09-18 NOTE — Op Note (Addendum)
 Preoperative diagnosis: Umblical hernia, 3 mm Postoperative diagnosis: Same as above Procedure: 1.  Diagnostic laparoscopy 2.  Open umbilical hernia repair with suture Surgeon: Dr. Harden Mo Anesthesia: General Estimated blood loss: Minimal Drains: None Specimens: None Complications: None Sponge needle count was correct completion Disposition recovery stable addition  Indications: This is a healthy 53 year old male who has a bulge at his umbilicus.  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  The bulge is present all the time and does not reseed.  We discussed going to the operating room for a hernia repair.  Procedure: After informed consent was obtained he was taken to the operating room.  He was placed under general anesthesia without complication.  He was given antibiotics.  SCDs were in place.  He was then prepped and draped in a standard sterile surgical fashion.  Surgical timeout was then performed.  I placed a laparoscope in the left upper quadrant under direct vision using a direct optical entry trocar.  There is no evidence of an entry injury.  I then viewed the abdomen.  He had some omentum incarcerated in a very small less than 4 mm umbilical hernia.  I elected to make a curvilinear incision below the umbilicus.  I then encircled the umbilical stalk and divided it.  He had a fair amount of preperitoneal fat located in this as well.  I just excised this and completely reduced the remainder.  Due to the size and the fact he had no prior surgery elected to fix this primarily.  I used #1 Ethibond suture to fix this without any tension.  I then tacked the umbilicus back down with Vicryl suture.  This was then closed with 3-0 Vicryl for Monocryl.  Glue and Steri-Strips were applied.  The laparoscope had been removed and the abdomen desufflated.  He was extubated and transferred to recovery stable.

## 2023-09-18 NOTE — Anesthesia Preprocedure Evaluation (Signed)
Anesthesia Evaluation  Patient identified by MRN, date of birth, ID band Patient awake    Reviewed: Allergy & Precautions, H&P , NPO status , Patient's Chart, lab work & pertinent test results  Airway Mallampati: II  TM Distance: >3 FB Neck ROM: Full    Dental no notable dental hx.    Pulmonary neg pulmonary ROS   Pulmonary exam normal breath sounds clear to auscultation       Cardiovascular negative cardio ROS Normal cardiovascular exam Rhythm:Regular Rate:Normal     Neuro/Psych negative neurological ROS  negative psych ROS   GI/Hepatic negative GI ROS, Neg liver ROS,,,  Endo/Other  negative endocrine ROS    Renal/GU negative Renal ROS  negative genitourinary   Musculoskeletal  (+) Arthritis , Osteoarthritis,    Abdominal  (+) + obese  Peds negative pediatric ROS (+)  Hematology negative hematology ROS (+)   Anesthesia Other Findings   Reproductive/Obstetrics negative OB ROS                             Anesthesia Physical Anesthesia Plan  ASA: 2  Anesthesia Plan: General   Post-op Pain Management: Regional block* and Tylenol PO (pre-op)*   Induction: Intravenous  PONV Risk Score and Plan: 2 and Ondansetron, Midazolam and Treatment may vary due to age or medical condition  Airway Management Planned: Oral ETT  Additional Equipment:   Intra-op Plan:   Post-operative Plan: Extubation in OR  Informed Consent: I have reviewed the patients History and Physical, chart, labs and discussed the procedure including the risks, benefits and alternatives for the proposed anesthesia with the patient or authorized representative who has indicated his/her understanding and acceptance.     Dental advisory given  Plan Discussed with: CRNA  Anesthesia Plan Comments:        Anesthesia Quick Evaluation

## 2023-09-18 NOTE — Interval H&P Note (Signed)
History and Physical Interval Note:  09/18/2023 12:10 PM  Roger Pace  has presented today for surgery, with the diagnosis of umbilical hernia.  The various methods of treatment have been discussed with the patient and family. After consideration of risks, benefits and other options for treatment, the patient has consented to  Procedure(s) with comments: UMBILICAL HERNIA REPAIR, POSSIBLE MESH (N/A) - RNFA GEN & TAP BLOCK DIAGNOSTIC LAPAROSCOPY (N/A) as a surgical intervention.  The patient's history has been reviewed, patient examined, no change in status, stable for surgery.  I have reviewed the patient's chart and labs.  Questions were answered to the patient's satisfaction.     [name]

## 2023-09-18 NOTE — Discharge Instructions (Signed)
[facility]  UMBILICAL OR INGUINAL [facility] REPAIR: POST OP INSTRUCTIONS  Always review your discharge instruction sheet given to you by the facility where your surgery was performed. IF YOU HAVE DISABILITY OR FAMILY LEAVE FORMS, YOU MUST BRING THEM TO THE OFFICE FOR PROCESSING.   DO NOT GIVE THEM TO YOUR DOCTOR.  A  prescription for pain medication may be given to you upon discharge.  Take your pain medication as prescribed, if needed.  If narcotic pain medicine is not needed, then you may take acetaminophen (Tylenol), naprosyn (Alleve) or ibuprofen (Advil) as needed. Take your usually prescribed medications unless otherwise directed. If you need a refill on your pain medication, please contact your pharmacy.  They will contact our office to request authorization. Prescriptions will not be filled after 5 pm or on week-ends. You should follow a light diet the first 24 hours after arrival home, such as soup and crackers, etc.  Be sure to include lots of fluids daily.  Resume your normal diet the day after surgery. Most patients will experience some swelling and bruising around the umbilicus or in the groin and scrotum.  Ice packs and reclining will help.  Swelling and bruising can take several days to resolve.  It is common to experience some constipation if taking pain medication after surgery.  Increasing fluid intake and taking a stool softener (such as Colace) will usually help or prevent this problem from occurring.  A mild laxative (Milk of Magnesia or Miralax) should be taken according to package directions if there are no bowel movements after 48 hours. Unless discharge instructions indicate otherwise, you may remove your bandages 48 hours after surgery, and you may shower at that time.  You may have steri-strips (small skin tapes) in place directly over the incision.  These strips should be left on the skin for 7-10 days and will come off on their own.  If your surgeon used  skin glue on the incision, you may shower in 24 hours.  The glue will flake off over the next 2-3 weeks.  Any sutures or staples will be removed at the office during your follow-up visit. ACTIVITIES:  You may resume regular (light) daily activities beginning the next day--such as daily self-care, walking, climbing stairs--gradually increasing activities as tolerated.  You may have sexual intercourse when it is comfortable.  Refrain from any heavy lifting or straining until approved by your doctor. You may drive when you are no longer taking prescription pain medication, you can comfortably wear a seatbelt, and you can safely maneuver your car and apply brakes. RETURN TO WORK:  __________________________________________________________ Bonita Quin should see your doctor in the office for a follow-up appointment approximately 2-3 weeks after your surgery.  Make sure that you call for this appointment within a day or two after you arrive home to insure a convenient appointment time. OTHER INSTRUCTIONS:  __________________________________________________________________________________________________________________________________________________________________________________________  WHEN TO CALL YOUR DOCTOR: Fever over 101.0 Inability to urinate Nausea and/or vomiting Extreme swelling or bruising Continued bleeding from incision. Increased pain, redness, or drainage from the incision  The clinic staff is available to answer your questions during regular business hours.  Please don't hesitate to call and ask to speak to one of the nurses for clinical concerns.  If you have a medical emergency, go to the nearest emergency room or call 911.  A surgeon from [facility] is always on call at the hospital   [address], [address], [place], [place]  [address] ?  [address] [address], [place], [place]   [address] [phone] ? [phone] ? FAX [phone] Web site:  [email or web address]

## 2023-09-19 ENCOUNTER — Encounter (HOSPITAL_COMMUNITY): Payer: Self-pay | Admitting: General Surgery

## 2023-10-23 ENCOUNTER — Other Ambulatory Visit (INDEPENDENT_AMBULATORY_CARE_PROVIDER_SITE_OTHER): Payer: Self-pay

## 2023-10-23 ENCOUNTER — Ambulatory Visit: Payer: 59 | Admitting: Orthopedic Surgery

## 2023-10-23 VITALS — Ht 71.75 in | Wt 271.4 lb

## 2023-10-23 DIAGNOSIS — M25561 Pain in right knee: Secondary | ICD-10-CM | POA: Diagnosis not present

## 2023-10-23 DIAGNOSIS — M25562 Pain in left knee: Secondary | ICD-10-CM | POA: Diagnosis not present

## 2023-10-25 ENCOUNTER — Encounter: Payer: Self-pay | Admitting: Orthopedic Surgery

## 2023-10-25 NOTE — Progress Notes (Signed)
 Office Visit Note   Patient: Roger Pace           Date of Birth: November 07, 1970           MRN: [id number] Visit Date: 10/23/2023 Requested by: Lavada Mesi, MD [address] [place],  [place] [address] PCP: Lavada Mesi, MD  Subjective: Chief Complaint  Patient presents with   Left Knee - Pain   Right Knee - Pain    HPI: Roger Pace is a 53 y.o. male who presents to the office reporting bilateral severe knee pain.  Patient states he has pain with all activities.  The right is worse than the left.  Patient has a history of ligament reconstruction in the right knee when he was playing football.  Also has history of arthroscopy and meniscal debridement in the left knee.  Patient states he had bone patella tendon bone ACL reconstruction in the right knee.  He works as a Nurse, adult primarily in the office.  Had hernia surgery 2 weeks ago.  He does done qualifications within the next week or 2 and only has to do that once a year.  Patient does take Celebrex for his symptoms.  The pain does not necessarily wake him from sleep but it significantly restricts his activities of daily living.  Has a history of injections which have failed to give him any type of sustained relief.  Lives in a third floor apartment.  Wife is at home.  No personal or family history of DVT or pulmonary embolism.  Otherwise very healthy..                ROS: All systems reviewed are negative as they relate to the chief complaint within the history of present illness.  Patient denies fevers or chills.  Assessment & Plan: Visit Diagnoses:  1. 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  Hardware is present in the right knee which would likely need to be removed at least on the tibial side.  Patient would like to have bilateral knee replacements.  I think he is a reasonable candidate for that intervention.  No significant health issues or  medical comorbidities at this time.  Plan is for bilateral knee replacements starting with the left knee.  The risk and benefits are discussed with the patient include not limited to infection or vessel damage knee stiffness incomplete pain relief as well as incomplete functional restoration.  The rigorous nature of the rehabilitative process for both knees is also discussed.  He would like to go through rehab only once.  Nonetheless this will be a significant hurdle for him to overcome particularly the first week or 2 in terms of gaining range of motion.  Patient understands risk and benefits and wishes to proceed.  Anticipate at least 2-3 nights in the hospital to become functional prior to going back to his third floor apartment.  All questions answered.  Follow-Up Instructions: No follow-ups on file.   Orders:  Orders Placed This Encounter  Procedures   XR KNEE 3 VIEW RIGHT   XR Knee 1-2 Views Left   No orders of the defined types were placed in this encounter.     Procedures: No procedures performed   Clinical Data: No additional findings.  Objective: Vital Signs: Ht 5' 11.75" (1.822 m)   Wt 271 lb 6.4 oz (123.1 kg)   BMI 37.07 kg/m   Physical Exam:  Constitutional: Patient appears well-developed HEENT:  Head: Normocephalic Eyes:EOM are normal Neck: Normal range of motion Cardiovascular: Normal rate Pulmonary/chest: Effort normal Neurologic: Patient is alert Skin: Skin is warm Psychiatric: Patient has normal mood and affect  Ortho Exam: Ortho exam demonstrates well-healed surgical incision on that right knee.  Lacks about 5 to 7 degrees of full extension in both knees but can flex to about 110 on both sides.  Collateral and cruciate ligaments are stable.  Patellofemoral crepitus is present.  Pedal pulses palpable.  Ankle dorsiflexion intact bilaterally.  No groin pain with internal/external Tatian of either leg.  Mild effusions present in both knees.  Overall the alignment  looks pretty reasonable when standing in both knees.  Specialty Comments:  No specialty comments available.  Imaging: No results found.   PMFS History: Patient Active Problem List   Diagnosis Date Noted   Class 2 obesity without serious comorbidity with body mass index (BMI) of 38.0 to 38.9 in adult 12/22/2018   Elevated LFTs 12/22/2018   Erectile dysfunction 12/22/2018   Past Medical History:  Diagnosis Date   Arthritis     Family History  Problem Relation Age of Onset   Hypertension Mother    Irregular heart beat Mother    Osteoarthritis Mother        Knees   Diabetes Father    Hypertension Father    Other Father        Elevated LFT   Prostate cancer Father    Cancer Father    Leukemia Sister    Cancer Sister    Arrhythmia Brother    Hyperlipidemia Brother    Hypertension Brother    Prostate cancer Maternal Uncle    Cancer Maternal Uncle    Prostate cancer Paternal Uncle    Cancer Paternal Uncle     Past Surgical History:  Procedure Laterality Date   KNEE SURGERY Bilateral    arthroscopy on each knee, and reconstruction on right knee   LAPAROSCOPY N/A 09/18/2023   Procedure: DIAGNOSTIC LAPAROSCOPY;  Surgeon: [name], MD;  Location: [facility] OR;  Service: General;  Laterality: N/A;   SHOULDER SURGERY Left    repaired humerus (shoulder kept dislocating)   UMBILICAL HERNIA REPAIR N/A 09/18/2023   Procedure: UMBILICAL HERNIA REPAIR;  Surgeon: [name], MD;  Location: [facility] OR;  Service: General;  Laterality: N/A;  RNFA GEN & TAP BLOCK   Social History   Occupational History   Not on file  Tobacco Use   Smoking status: Never   Smokeless tobacco: Never  Vaping Use   Vaping status: Never Used  Substance and Sexual Activity   Alcohol use: No   Drug use: No   Sexual activity: Not on file

## 2023-10-27 ENCOUNTER — Encounter: Payer: Self-pay | Admitting: Orthopedic Surgery

## 2024-04-08 NOTE — Progress Notes (Signed)
 Surgical Instructions   Your procedure is scheduled on Tuesday April 14, 2024. Report to [facility] Main Entrance A at 5:30 A.M., then check in with the Admitting office. Any questions or running late day of surgery: call [phone]  Questions prior to your surgery date: call [phone], Monday-Friday, 8am-4pm. If you experience any cold or flu symptoms such as cough, fever, chills, shortness of breath, etc. between now and your scheduled surgery, please notify us  at the above number.     Remember:  Do not eat after midnight the night before your surgery  You may drink clear liquids until 4:30 the morning of your surgery.   Clear liquids allowed are: Water, Non-Citrus Juices (without pulp), Carbonated Beverages, Clear Tea (no milk, honey, etc.), Black Coffee Only (NO MILK, CREAM OR POWDERED CREAMER of any kind), and Gatorade.  Patient Instructions  The night before surgery:  No food after midnight. ONLY clear liquids after midnight  The day of surgery (if you do NOT have diabetes):  Drink ONE (1) Pre-Surgery Clear Ensure by 4:30 the morning of surgery. Drink in one sitting. Do not sip.  This drink was given to you during your hospital  pre-op appointment visit.  Nothing else to drink after completing the  Pre-Surgery Clear Ensure.         If you have questions, please contact your surgeon's office.    Take these medicines the morning of surgery with A SIP OF WATER: None  One week prior to surgery, STOP taking any Aspirin (unless otherwise instructed by your surgeon) Aleve, Naproxen, Ibuprofen, Motrin, Advil, Goody's, BC's, all herbal medications, fish oil, and non-prescription vitamins. This includes your celecoxib  (CELEBREX ).                      Do NOT Smoke (Tobacco/Vaping) for 24 hours prior to your procedure.  If you use a CPAP at night, you may bring your mask/headgear for your overnight stay.   You will be asked to remove any contacts, glasses, piercing's,  hearing aid's, dentures/partials prior to surgery. Please bring cases for these items if needed.    Patients discharged the day of surgery will not be allowed to drive home, and someone needs to stay with them for 24 hours.  SURGICAL WAITING ROOM VISITATION Patients may have no more than 2 support people in the waiting area - these visitors may rotate.   Pre-op nurse will coordinate an appropriate time for 1 ADULT support person, who may not rotate, to accompany patient in pre-op.  Children under the age of 13 must have an adult with them who is not the patient and must remain in the main waiting area with an adult.  If the patient needs to stay at the hospital during part of their recovery, the visitor guidelines for inpatient rooms apply.  Please refer to the [facility] website for the visitor guidelines for any additional information.   If you received a COVID test during your pre-op visit  it is requested that you wear a mask when out in public, stay away from anyone that may not be feeling well and notify your surgeon if you develop symptoms. If you have been in contact with anyone that has tested positive in the last 10 days please notify you surgeon.      Pre-operative 5 CHG Bathing Instructions   You can play a key role in reducing the risk of infection after surgery. Your skin needs to be as free of  germs as possible. You can reduce the number of germs on your skin by washing with CHG (chlorhexidine  gluconate) soap before surgery. CHG is an antiseptic soap that kills germs and continues to kill germs even after washing.   DO NOT use if you have an allergy to chlorhexidine /CHG or antibacterial soaps. If your skin becomes reddened or irritated, stop using the CHG and notify one of our RNs at [phone].   Please shower with the CHG soap starting 4 days before surgery using the following schedule:     Please keep in mind the following:  You may shave your face at any point  before/day of surgery.  Place clean sheets on your bed the day you start using CHG soap. Use a clean washcloth (not used since being washed) for each shower. DO NOT sleep with pets once you start using the CHG.   CHG Shower Instructions:  Wash your face and private area with normal soap. If you choose to wash your hair, wash first with your normal shampoo.  After you use shampoo/soap, rinse your hair and body thoroughly to remove shampoo/soap residue.  Turn the water OFF and apply about 3 tablespoons (45 ml) of CHG soap to a CLEAN washcloth.  Apply CHG soap ONLY FROM YOUR NECK DOWN TO YOUR TOES (washing for 3-5 minutes)  DO NOT use CHG soap on face, private areas, open wounds, or sores.  Pay special attention to the area where your surgery is being performed.  If you are having back surgery, having someone wash your back for you may be helpful. Wait 2 minutes after CHG soap is applied, then you may rinse off the CHG soap.  Pat dry with a clean towel  Put on clean clothes/pajamas   If you choose to wear lotion, please use ONLY the CHG-compatible lotions that are listed below.  Additional instructions for the day of surgery: DO NOT APPLY any lotions, deodorants or cologne.   Do not bring valuables to the hospital. [facility] is not responsible for any belongings/valuables. Do not wear jewelry  Put on clean/comfortable clothes.  Please brush your teeth.  Ask your nurse before applying any prescription medications to the skin.     CHG Compatible Lotions   Aveeno Moisturizing lotion  Cetaphil Moisturizing Cream  Cetaphil Moisturizing Lotion  Clairol Herbal Essence Moisturizing Lotion, Dry Skin  Clairol Herbal Essence Moisturizing Lotion, Extra Dry Skin  Clairol Herbal Essence Moisturizing Lotion, Normal Skin  Curel Age Defying Therapeutic Moisturizing Lotion with Alpha Hydroxy  Curel Extreme Care Body Lotion  Curel Soothing Hands Moisturizing Hand Lotion  Curel Therapeutic  Moisturizing Cream, Fragrance-Free  Curel Therapeutic Moisturizing Lotion, Fragrance-Free  Curel Therapeutic Moisturizing Lotion, Original Formula  Eucerin Daily Replenishing Lotion  Eucerin Dry Skin Therapy Plus Alpha Hydroxy Crme  Eucerin Dry Skin Therapy Plus Alpha Hydroxy Lotion  Eucerin Original Crme  Eucerin Original Lotion  Eucerin Plus Crme Eucerin Plus Lotion  Eucerin TriLipid Replenishing Lotion  Keri Anti-Bacterial Hand Lotion  Keri Deep Conditioning Original Lotion Dry Skin Formula Softly Scented  Keri Deep Conditioning Original Lotion, Fragrance Free Sensitive Skin Formula  Keri Lotion Fast Absorbing Fragrance Free Sensitive Skin Formula  Keri Lotion Fast Absorbing Softly Scented Dry Skin Formula  Keri Original Lotion  Keri Skin Renewal Lotion Keri Silky Smooth Lotion  Keri Silky Smooth Sensitive Skin Lotion  Nivea Body Creamy Conditioning Oil  Nivea Body Extra Enriched Building services engineer Sheer Moisturizing Lotion Nivea Crme  Nivea Skin Firming Lotion  NutraDerm 30 Skin Lotion  NutraDerm Skin Lotion  NutraDerm Therapeutic Skin Cream  NutraDerm Therapeutic Skin Lotion  ProShield Protective Hand Cream  Provon moisturizing lotion  Please read over the following fact sheets that you were given.

## 2024-04-09 ENCOUNTER — Encounter (HOSPITAL_COMMUNITY): Payer: Self-pay

## 2024-04-09 ENCOUNTER — Encounter (HOSPITAL_COMMUNITY)
Admission: RE | Admit: 2024-04-09 | Discharge: 2024-04-09 | Disposition: A | Discharge status: Home / Self Care | Source: Ambulatory Visit | Attending: Orthopedic Surgery | Admitting: Orthopedic Surgery

## 2024-04-09 ENCOUNTER — Other Ambulatory Visit: Payer: Self-pay

## 2024-04-09 VITALS — BP 136/72 | HR 66 | Temp 98.4°F | Resp 19 | Ht 71.5 in | Wt 267.0 lb

## 2024-04-09 DIAGNOSIS — Z01818 Encounter for other preprocedural examination: Secondary | ICD-10-CM

## 2024-04-09 DIAGNOSIS — Z01812 Encounter for preprocedural laboratory examination: Secondary | ICD-10-CM | POA: Diagnosis present

## 2024-04-09 LAB — URINALYSIS, W/ REFLEX TO CULTURE (INFECTION SUSPECTED)
Bacteria, UA: NONE SEEN
Bilirubin Urine: NEGATIVE
Glucose, UA: NEGATIVE mg/dL
Hgb urine dipstick: NEGATIVE
Ketones, ur: NEGATIVE mg/dL
Leukocytes,Ua: NEGATIVE
Nitrite: NEGATIVE
Protein, ur: NEGATIVE mg/dL
Specific Gravity, Urine: 1.024 (ref 1.005–1.030)
pH: 5 (ref 5.0–8.0)

## 2024-04-09 LAB — BASIC METABOLIC PANEL WITH GFR
Anion gap: 10 (ref 5–15)
BUN: 17 mg/dL (ref 6–20)
CO2: 23 mmol/L (ref 22–32)
Calcium: 9 mg/dL (ref 8.9–10.3)
Chloride: 107 mmol/L (ref 98–111)
Creatinine, Ser: 1.04 mg/dL (ref 0.61–1.24)
GFR, Estimated: >60 mL/min (ref >60)
Glucose, Bld: 139 mg/dL — ABNORMAL HIGH (ref 70–99)
Potassium: 3.7 mmol/L (ref 3.5–5.1)
Sodium: 140 mmol/L (ref 135–145)

## 2024-04-09 LAB — SURGICAL PCR SCREEN
MRSA, PCR: NEGATIVE
Staphylococcus aureus: NEGATIVE

## 2024-04-09 LAB — CBC
HCT: 40 % (ref 39.0–52.0)
Hemoglobin: 13.6 g/dL (ref 13.0–17.0)
MCH: 31.7 pg (ref 26.0–34.0)
MCHC: 34 g/dL (ref 30.0–36.0)
MCV: 93.2 fL (ref 80.0–100.0)
Platelets: 296 K/uL (ref 150–400)
RBC: 4.29 MIL/uL (ref 4.22–5.81)
RDW: 12.8 % (ref 11.5–15.5)
WBC: 4.7 K/uL (ref 4.0–10.5)
nRBC: 0 % (ref 0.0–0.2)

## 2024-04-09 NOTE — Progress Notes (Signed)
 PCP - Hilts, Holiday representative - denies  PPM/ICD - denies Device Orders - n/a Rep Notified - n/a  Chest x-ray - denies EKG - n/a Stress Test - denies ECHO - denies Cardiac Cath - denies  Sleep Study - had sleep studies in 2009; was negative CPAP - n/a  Fasting Blood Sugar - no DM Checks Blood Sugar _____ times a day  Last dose of GLP1 agonist-  n/a GLP1 instructions: n/a  Blood Thinner Instructions: n/a Aspirin Instructions: n/a  ERAS Protcol -yes, till 0430 am PRE-SURGERY Ensure or G2- Ensure  COVID TEST- n/a   Anesthesia review: no  Patient denies shortness of breath, fever, cough and chest pain at PAT appointment   All instructions explained to the patient, with a verbal understanding of the material. Patient agrees to go over the instructions while at home for a better understanding. Patient also instructed to self quarantine after being tested for COVID-19. The opportunity to ask questions was provided.

## 2024-04-13 NOTE — Anesthesia Preprocedure Evaluation (Signed)
 Anesthesia Evaluation  Patient identified by MRN, date of birth, ID band Patient awake    Reviewed: Allergy & Precautions, NPO status , Patient's Chart, lab work & pertinent test results  Airway Mallampati: II  TM Distance: >3 FB Neck ROM: Full    Dental no notable dental hx. (+) Teeth Intact, Dental Advisory Given   Pulmonary neg pulmonary ROS   Pulmonary exam normal breath sounds clear to auscultation       Cardiovascular negative cardio ROS Normal cardiovascular exam Rhythm:Regular Rate:Normal  HLD   Neuro/Psych negative neurological ROS  negative psych ROS   GI/Hepatic negative GI ROS, Neg liver ROS,,,  Endo/Other  negative endocrine ROS  Obese BMI 37  Renal/GU negative Renal ROS  negative genitourinary   Musculoskeletal negative musculoskeletal ROS (+)    Abdominal   Peds  Hematology negative hematology ROS (+)   Anesthesia Other Findings   Reproductive/Obstetrics                              Anesthesia Physical Anesthesia Plan  ASA: 2  Anesthesia Plan: General and Regional   Post-op Pain Management: Regional block*, Dilaudid  IV, Ketamine  IV* and Tylenol  PO (pre-op)*   Induction: Intravenous  PONV Risk Score and Plan: 2 and Midazolam , Dexamethasone  and Ondansetron   Airway Management Planned: Oral ETT  Additional Equipment:   Intra-op Plan:   Post-operative Plan: Extubation in OR  Informed Consent: I have reviewed the patients History and Physical, chart, labs and discussed the procedure including the risks, benefits and alternatives for the proposed anesthesia with the patient or authorized representative who has indicated his/her understanding and acceptance.     Dental advisory given  Plan Discussed with: CRNA  Anesthesia Plan Comments:          Anesthesia Quick Evaluation

## 2024-04-14 ENCOUNTER — Other Ambulatory Visit: Payer: Self-pay

## 2024-04-14 ENCOUNTER — Encounter (HOSPITAL_COMMUNITY): Payer: Self-pay | Admitting: Orthopedic Surgery

## 2024-04-14 ENCOUNTER — Inpatient Hospital Stay (HOSPITAL_COMMUNITY): Admitting: Anesthesiology

## 2024-04-14 ENCOUNTER — Inpatient Hospital Stay (HOSPITAL_COMMUNITY)
Admission: RE | Admit: 2024-04-14 | Discharge: 2024-04-22 | DRG: 461 | Disposition: A | Discharge status: Rehabilitation Facility | Attending: Orthopedic Surgery | Admitting: Orthopedic Surgery

## 2024-04-14 ENCOUNTER — Encounter (HOSPITAL_COMMUNITY)
Admission: RE | Disposition: A | Discharge status: Rehabilitation Facility | Payer: Self-pay | Source: Home / Self Care | Attending: Orthopedic Surgery

## 2024-04-14 DIAGNOSIS — Z96653 Presence of artificial knee joint, bilateral: Secondary | ICD-10-CM

## 2024-04-14 DIAGNOSIS — Z01818 Encounter for other preprocedural examination: Principal | ICD-10-CM

## 2024-04-14 DIAGNOSIS — M1711 Unilateral primary osteoarthritis, right knee: Secondary | ICD-10-CM

## 2024-04-14 DIAGNOSIS — I951 Orthostatic hypotension: Secondary | ICD-10-CM | POA: Diagnosis present

## 2024-04-14 DIAGNOSIS — I2699 Other pulmonary embolism without acute cor pulmonale: Secondary | ICD-10-CM | POA: Diagnosis present

## 2024-04-14 DIAGNOSIS — M1712 Unilateral primary osteoarthritis, left knee: Secondary | ICD-10-CM

## 2024-04-14 DIAGNOSIS — M170 Bilateral primary osteoarthritis of knee: Secondary | ICD-10-CM

## 2024-04-14 DIAGNOSIS — Z91013 Allergy to seafood: Secondary | ICD-10-CM

## 2024-04-14 DIAGNOSIS — Z7901 Long term (current) use of anticoagulants: Secondary | ICD-10-CM

## 2024-04-14 DIAGNOSIS — T8172XA Complication of vein following a procedure, not elsewhere classified, initial encounter: Secondary | ICD-10-CM | POA: Diagnosis not present

## 2024-04-14 DIAGNOSIS — Z833 Family history of diabetes mellitus: Secondary | ICD-10-CM

## 2024-04-14 DIAGNOSIS — R509 Fever, unspecified: Secondary | ICD-10-CM | POA: Insufficient documentation

## 2024-04-14 DIAGNOSIS — E871 Hypo-osmolality and hyponatremia: Secondary | ICD-10-CM | POA: Diagnosis present

## 2024-04-14 DIAGNOSIS — I82453 Acute embolism and thrombosis of peroneal vein, bilateral: Secondary | ICD-10-CM | POA: Diagnosis not present

## 2024-04-14 DIAGNOSIS — Z79899 Other long term (current) drug therapy: Secondary | ICD-10-CM

## 2024-04-14 DIAGNOSIS — I82409 Acute embolism and thrombosis of unspecified deep veins of unspecified lower extremity: Secondary | ICD-10-CM | POA: Insufficient documentation

## 2024-04-14 DIAGNOSIS — R000 Tachycardia, unspecified: Secondary | ICD-10-CM | POA: Insufficient documentation

## 2024-04-14 DIAGNOSIS — Z8249 Family history of ischemic heart disease and other diseases of the circulatory system: Secondary | ICD-10-CM

## 2024-04-14 DIAGNOSIS — Z6838 Body mass index (BMI) 38.0-38.9, adult: Secondary | ICD-10-CM

## 2024-04-14 DIAGNOSIS — Z83438 Family history of other disorder of lipoprotein metabolism and other lipidemia: Secondary | ICD-10-CM

## 2024-04-14 DIAGNOSIS — E66812 Obesity, class 2: Secondary | ICD-10-CM | POA: Diagnosis present

## 2024-04-14 DIAGNOSIS — Z6837 Body mass index (BMI) 37.0-37.9, adult: Secondary | ICD-10-CM

## 2024-04-14 DIAGNOSIS — Y848 Other medical procedures as the cause of abnormal reaction of the patient, or of later complication, without mention of misadventure at the time of the procedure: Secondary | ICD-10-CM | POA: Diagnosis not present

## 2024-04-14 DIAGNOSIS — I82443 Acute embolism and thrombosis of tibial vein, bilateral: Secondary | ICD-10-CM | POA: Diagnosis not present

## 2024-04-14 HISTORY — PX: TOTAL KNEE ARTHROPLASTY: SHX125

## 2024-04-14 HISTORY — DX: Hypersomnia, unspecified: G47.10

## 2024-04-14 HISTORY — DX: Other specified abnormal findings of blood chemistry: R79.89

## 2024-04-14 HISTORY — DX: Personal history of other endocrine, nutritional and metabolic disease: Z86.39

## 2024-04-14 HISTORY — PX: HARDWARE REMOVAL: SHX979

## 2024-04-14 HISTORY — DX: Personal history of other diseases of the digestive system: Z87.19

## 2024-04-14 SURGERY — ARTHROPLASTY, KNEE, BILATERAL, TOTAL
Anesthesia: Regional | Site: Knee | Laterality: Right

## 2024-04-14 MED ORDER — VANCOMYCIN HCL 1000 MG IV SOLR
INTRAVENOUS | Status: DC | PRN
  Administered 2024-04-14 (×2): 1000 mg via TOPICAL

## 2024-04-14 MED ORDER — PHENOL 1.4 % MT LIQD: 1.0000 | OROMUCOSAL | Status: DC | PRN

## 2024-04-14 MED ORDER — POVIDONE-IODINE 7.5 % EX SOLN: Freq: Once | CUTANEOUS | Status: DC

## 2024-04-14 MED ORDER — MORPHINE SULFATE 4 MG/ML IJ SOLN
INTRAMUSCULAR | Status: DC | PRN
  Administered 2024-04-14: 45 mL via INTRA_ARTICULAR

## 2024-04-14 MED ORDER — DOCUSATE SODIUM 100 MG PO CAPS
100.0000 mg | ORAL_CAPSULE | Freq: Two times a day (BID) | ORAL | Status: DC
  Administered 2024-04-14 – 2024-04-19 (×10): 100 mg via ORAL
  Filled 2024-04-14 (×10): qty 1

## 2024-04-14 MED ORDER — LACTATED RINGERS IV SOLN: INTRAVENOUS | Status: DC

## 2024-04-14 MED ORDER — PROPOFOL 10 MG/ML IV BOLUS
INTRAVENOUS | Status: AC
  Filled 2024-04-14: qty 20

## 2024-04-14 MED ORDER — FENTANYL CITRATE (PF) 250 MCG/5ML IJ SOLN
INTRAMUSCULAR | Status: AC
  Filled 2024-04-14: qty 5

## 2024-04-14 MED ORDER — POVIDONE-IODINE 10 % EX SWAB: 2.0000 | Freq: Once | CUTANEOUS | Status: DC

## 2024-04-14 MED ORDER — CELECOXIB 100 MG PO CAPS
100.0000 mg | ORAL_CAPSULE | Freq: Two times a day (BID) | ORAL | Status: DC
  Administered 2024-04-14 – 2024-04-16 (×4): 100 mg via ORAL
  Filled 2024-04-14 (×4): qty 1

## 2024-04-14 MED ORDER — HYDROMORPHONE HCL 1 MG/ML IJ SOLN
INTRAMUSCULAR | Status: DC | PRN
  Administered 2024-04-14 (×3): .5 mg via INTRAVENOUS

## 2024-04-14 MED ORDER — EPHEDRINE SULFATE-NACL 50-0.9 MG/10ML-% IV SOSY
PREFILLED_SYRINGE | INTRAVENOUS | Status: DC | PRN
  Administered 2024-04-14: 10 mg via INTRAVENOUS

## 2024-04-14 MED ORDER — ACETAMINOPHEN 500 MG PO TABS
ORAL_TABLET | ORAL | Status: AC
  Administered 2024-04-14: 1000 mg via ORAL
  Filled 2024-04-14: qty 2

## 2024-04-14 MED ORDER — HYDROMORPHONE HCL 1 MG/ML IJ SOLN
INTRAMUSCULAR | Status: AC
  Filled 2024-04-14: qty 0.5

## 2024-04-14 MED ORDER — DEXAMETHASONE SODIUM PHOSPHATE 10 MG/ML IJ SOLN
INTRAMUSCULAR | Status: AC
  Filled 2024-04-14: qty 1

## 2024-04-14 MED ORDER — CEFAZOLIN SODIUM-DEXTROSE 3-4 GM/150ML-% IV SOLN
INTRAVENOUS | Status: AC
  Filled 2024-04-14: qty 150

## 2024-04-14 MED ORDER — ONDANSETRON HCL 4 MG/2ML IJ SOLN
INTRAMUSCULAR | Status: DC | PRN
  Administered 2024-04-14: 4 mg via INTRAVENOUS

## 2024-04-14 MED ORDER — ROCURONIUM BROMIDE 10 MG/ML (PF) SYRINGE
PREFILLED_SYRINGE | INTRAVENOUS | Status: DC | PRN
  Administered 2024-04-14 (×4): 10 mg via INTRAVENOUS
  Administered 2024-04-14: 70 mg via INTRAVENOUS

## 2024-04-14 MED ORDER — CHLORHEXIDINE GLUCONATE 0.12 % MT SOLN
OROMUCOSAL | Status: AC
  Administered 2024-04-14: 15 mL via OROMUCOSAL
  Filled 2024-04-14: qty 15

## 2024-04-14 MED ORDER — ONDANSETRON HCL 4 MG PO TABS
4.0000 mg | ORAL_TABLET | Freq: Four times a day (QID) | ORAL | Status: DC | PRN
Start: 2024-04-14 — End: 2024-04-22

## 2024-04-14 MED ORDER — ROPIVACAINE HCL 5 MG/ML IJ SOLN
INTRAMUSCULAR | Status: DC | PRN
  Administered 2024-04-14 (×2): 20 mL via PERINEURAL

## 2024-04-14 MED ORDER — TRANEXAMIC ACID 1000 MG/10ML IV SOLN
2000.0000 mg | Freq: Once | INTRAVENOUS | Status: AC
  Administered 2024-04-14: 2000 mg via TOPICAL
  Filled 2024-04-14: qty 20

## 2024-04-14 MED ORDER — MENTHOL 3 MG MT LOZG: 1.0000 | LOZENGE | OROMUCOSAL | Status: DC | PRN

## 2024-04-14 MED ORDER — TRANEXAMIC ACID 1000 MG/10ML IV SOLN: 2000.0000 mg | Freq: Once | INTRAVENOUS | Status: DC

## 2024-04-14 MED ORDER — METHOCARBAMOL 1000 MG/10ML IJ SOLN: 500.0000 mg | Freq: Four times a day (QID) | INTRAMUSCULAR | Status: DC | PRN

## 2024-04-14 MED ORDER — OXYCODONE HCL 5 MG PO TABS
5.0000 mg | ORAL_TABLET | ORAL | Status: DC | PRN
  Administered 2024-04-15: 10 mg via ORAL
  Administered 2024-04-15: 5 mg via ORAL
  Administered 2024-04-16 – 2024-04-17 (×5): 10 mg via ORAL
  Administered 2024-04-17 – 2024-04-18 (×2): 5 mg via ORAL
  Administered 2024-04-18: 10 mg via ORAL
  Administered 2024-04-18: 5 mg via ORAL
  Administered 2024-04-18 – 2024-04-22 (×13): 10 mg via ORAL
  Filled 2024-04-14 (×2): qty 2
  Filled 2024-04-14: qty 1
  Filled 2024-04-14 (×2): qty 2
  Filled 2024-04-14: qty 1
  Filled 2024-04-14 (×14): qty 2
  Filled 2024-04-14: qty 1
  Filled 2024-04-14 (×3): qty 2

## 2024-04-14 MED ORDER — ACETAMINOPHEN 10 MG/ML IV SOLN
INTRAVENOUS | Status: DC | PRN
Start: 2024-04-14 — End: 2024-04-14
  Administered 2024-04-14: 1000 mg via INTRAVENOUS

## 2024-04-14 MED ORDER — DEXAMETHASONE SODIUM PHOSPHATE 10 MG/ML IJ SOLN
INTRAMUSCULAR | Status: DC | PRN
  Administered 2024-04-14: 10 mg via INTRAVENOUS

## 2024-04-14 MED ORDER — ACETAMINOPHEN 500 MG PO TABS
1000.0000 mg | ORAL_TABLET | Freq: Four times a day (QID) | ORAL | Status: AC
  Administered 2024-04-14 – 2024-04-15 (×4): 1000 mg via ORAL
  Filled 2024-04-14 (×4): qty 2

## 2024-04-14 MED ORDER — LIDOCAINE 2% (20 MG/ML) 5 ML SYRINGE
INTRAMUSCULAR | Status: DC | PRN
  Administered 2024-04-14: 20 mg via INTRAVENOUS

## 2024-04-14 MED ORDER — LIDOCAINE 2% (20 MG/ML) 5 ML SYRINGE
INTRAMUSCULAR | Status: AC
  Filled 2024-04-14: qty 5

## 2024-04-14 MED ORDER — OXYCODONE HCL 5 MG/5ML PO SOLN: 5.0000 mg | Freq: Once | ORAL | Status: DC | PRN

## 2024-04-14 MED ORDER — KETAMINE HCL 10 MG/ML IJ SOLN
INTRAMUSCULAR | Status: DC | PRN
Start: 2024-04-14 — End: 2024-04-14
  Administered 2024-04-14: 20 mg via INTRAVENOUS
  Administered 2024-04-14 (×3): 10 mg via INTRAVENOUS

## 2024-04-14 MED ORDER — MIDAZOLAM HCL 2 MG/2ML IJ SOLN
INTRAMUSCULAR | Status: DC | PRN
  Administered 2024-04-14: 2 mg via INTRAVENOUS

## 2024-04-14 MED ORDER — OXYCODONE HCL 5 MG PO TABS: 5.0000 mg | ORAL_TABLET | Freq: Once | ORAL | Status: DC | PRN

## 2024-04-14 MED ORDER — HYDROMORPHONE HCL 1 MG/ML IJ SOLN: 0.2500 mg | INTRAMUSCULAR | Status: DC | PRN

## 2024-04-14 MED ORDER — 0.9 % SODIUM CHLORIDE (POUR BTL) OPTIME
TOPICAL | Status: DC | PRN
Start: 2024-04-14 — End: 2024-04-14
  Administered 2024-04-14 (×2): 3000 mL

## 2024-04-14 MED ORDER — FENTANYL CITRATE (PF) 100 MCG/2ML IJ SOLN
25.0000 ug | INTRAMUSCULAR | Status: DC | PRN
  Administered 2024-04-14: 25 ug via INTRAVENOUS
  Administered 2024-04-14: 50 ug via INTRAVENOUS

## 2024-04-14 MED ORDER — CHLORHEXIDINE GLUCONATE 0.12 % MT SOLN: 15.0000 mL | OROMUCOSAL | Status: AC

## 2024-04-14 MED ORDER — VANCOMYCIN HCL 1000 MG IV SOLR
INTRAVENOUS | Status: AC
  Filled 2024-04-14: qty 20

## 2024-04-14 MED ORDER — FENTANYL CITRATE (PF) 100 MCG/2ML IJ SOLN
INTRAMUSCULAR | Status: AC
  Filled 2024-04-14: qty 2

## 2024-04-14 MED ORDER — CEFAZOLIN SODIUM-DEXTROSE 3-4 GM/150ML-% IV SOLN
3.0000 g | INTRAVENOUS | Status: AC
  Administered 2024-04-14 (×2): 3 g via INTRAVENOUS

## 2024-04-14 MED ORDER — MIDAZOLAM HCL 2 MG/2ML IJ SOLN
INTRAMUSCULAR | Status: AC
  Filled 2024-04-14: qty 2

## 2024-04-14 MED ORDER — AMISULPRIDE (ANTIEMETIC) 5 MG/2ML IV SOLN: 10.0000 mg | Freq: Once | INTRAVENOUS | Status: DC | PRN

## 2024-04-14 MED ORDER — DEXAMETHASONE SODIUM PHOSPHATE 10 MG/ML IJ SOLN
INTRAMUSCULAR | Status: DC | PRN
  Administered 2024-04-14 (×2): 5 mg via PERINEURAL

## 2024-04-14 MED ORDER — TRANEXAMIC ACID 1000 MG/10ML IV SOLN
2000.0000 mg | INTRAVENOUS | Status: DC
  Filled 2024-04-14: qty 20

## 2024-04-14 MED ORDER — METHOCARBAMOL 500 MG PO TABS
500.0000 mg | ORAL_TABLET | Freq: Four times a day (QID) | ORAL | Status: DC | PRN
  Administered 2024-04-20 – 2024-04-22 (×2): 500 mg via ORAL
  Filled 2024-04-14 (×3): qty 1

## 2024-04-14 MED ORDER — FENTANYL CITRATE (PF) 250 MCG/5ML IJ SOLN
INTRAMUSCULAR | Status: DC | PRN
  Administered 2024-04-14 (×5): 50 ug via INTRAVENOUS

## 2024-04-14 MED ORDER — PHENYLEPHRINE HCL-NACL 20-0.9 MG/250ML-% IV SOLN
INTRAVENOUS | Status: AC
Start: 2024-04-14 — End: 2024-04-14
  Filled 2024-04-14: qty 250

## 2024-04-14 MED ORDER — ROCURONIUM BROMIDE 10 MG/ML (PF) SYRINGE
PREFILLED_SYRINGE | INTRAVENOUS | Status: AC
  Filled 2024-04-14: qty 10

## 2024-04-14 MED ORDER — ONDANSETRON HCL 4 MG/2ML IJ SOLN
INTRAMUSCULAR | Status: AC
  Filled 2024-04-14: qty 2

## 2024-04-14 MED ORDER — ACETAMINOPHEN 10 MG/ML IV SOLN
INTRAVENOUS | Status: AC
Start: 2024-04-14 — End: 2024-04-14
  Filled 2024-04-14: qty 100

## 2024-04-14 MED ORDER — SUGAMMADEX SODIUM 200 MG/2ML IV SOLN
INTRAVENOUS | Status: DC | PRN
  Administered 2024-04-14 (×2): 100 mg via INTRAVENOUS

## 2024-04-14 MED ORDER — CEFAZOLIN SODIUM 1 G IJ SOLR
INTRAMUSCULAR | Status: AC
  Filled 2024-04-14: qty 30

## 2024-04-14 MED ORDER — GABAPENTIN 100 MG PO CAPS
200.0000 mg | ORAL_CAPSULE | Freq: Three times a day (TID) | ORAL | Status: DC
  Administered 2024-04-14 – 2024-04-22 (×24): 200 mg via ORAL
  Filled 2024-04-14 (×24): qty 2

## 2024-04-14 MED ORDER — HYDROMORPHONE HCL 1 MG/ML IJ SOLN
0.5000 mg | INTRAMUSCULAR | Status: DC | PRN
  Administered 2024-04-17 – 2024-04-21 (×2): 0.5 mg via INTRAVENOUS
  Filled 2024-04-14 (×2): qty 0.5

## 2024-04-14 MED ORDER — METOCLOPRAMIDE HCL 5 MG/ML IJ SOLN: 5.0000 mg | Freq: Three times a day (TID) | INTRAMUSCULAR | Status: DC | PRN

## 2024-04-14 MED ORDER — ACETAMINOPHEN 325 MG PO TABS
325.0000 mg | ORAL_TABLET | Freq: Four times a day (QID) | ORAL | Status: DC | PRN
  Administered 2024-04-15 – 2024-04-21 (×5): 650 mg via ORAL
  Filled 2024-04-14 (×5): qty 2

## 2024-04-14 MED ORDER — CLONIDINE HCL (ANALGESIA) 100 MCG/ML EP SOLN
150.0000 ug | Freq: Once | EPIDURAL | Status: DC
  Filled 2024-04-14: qty 10

## 2024-04-14 MED ORDER — CEFAZOLIN SODIUM-DEXTROSE 2-4 GM/100ML-% IV SOLN
2.0000 g | Freq: Three times a day (TID) | INTRAVENOUS | Status: AC
  Administered 2024-04-14 – 2024-04-15 (×3): 2 g via INTRAVENOUS
  Filled 2024-04-14 (×3): qty 100

## 2024-04-14 MED ORDER — PROPOFOL 10 MG/ML IV BOLUS
INTRAVENOUS | Status: DC | PRN
  Administered 2024-04-14: 200 mg via INTRAVENOUS

## 2024-04-14 MED ORDER — KETAMINE HCL 50 MG/5ML IJ SOSY
PREFILLED_SYRINGE | INTRAMUSCULAR | Status: AC
  Filled 2024-04-14: qty 5

## 2024-04-14 MED ORDER — TRANEXAMIC ACID-NACL 1000-0.7 MG/100ML-% IV SOLN
1000.0000 mg | INTRAVENOUS | Status: AC
  Administered 2024-04-14 (×2): 1000 mg via INTRAVENOUS

## 2024-04-14 MED ORDER — CLONIDINE HCL (ANALGESIA) 100 MCG/ML EP SOLN
150.0000 ug | Freq: Once | EPIDURAL | Status: AC
  Administered 2024-04-14: 1 mL via INTRA_ARTICULAR
  Filled 2024-04-14: qty 10

## 2024-04-14 MED ORDER — ASPIRIN 81 MG PO CHEW
81.0000 mg | CHEWABLE_TABLET | Freq: Two times a day (BID) | ORAL | Status: DC
  Administered 2024-04-14 – 2024-04-16 (×5): 81 mg via ORAL
  Filled 2024-04-14 (×5): qty 1

## 2024-04-14 MED ORDER — ACETAMINOPHEN 500 MG PO TABS: 1000.0000 mg | ORAL_TABLET | Freq: Once | ORAL | Status: AC

## 2024-04-14 MED ORDER — SODIUM CHLORIDE 0.9 % IV SOLN: Freq: Once | INTRAVENOUS | Status: DC

## 2024-04-14 MED ORDER — METOCLOPRAMIDE HCL 5 MG PO TABS: 5.0000 mg | ORAL_TABLET | Freq: Three times a day (TID) | ORAL | Status: DC | PRN

## 2024-04-14 SURGICAL SUPPLY — 87 items
BAG COUNTER SPONGE SURGICOUNT (BAG) ×3 IMPLANT
BAG DECANTER FOR FLEXI CONT (MISCELLANEOUS) IMPLANT
BANDAGE ESMARK 6X9 LF (GAUZE/BANDAGES/DRESSINGS) ×3 IMPLANT
BIT DRILL 4.8X200 CANN (BIT) IMPLANT
BLADE SAG 18X100X1.27 (BLADE) ×3 IMPLANT
BLADE SAW SGTL 13.0X1.19X90.0M (BLADE) IMPLANT
BLADE SURG 10 STRL SS (BLADE) ×12 IMPLANT
BNDG COHESIVE 4X5 TAN STRL LF (GAUZE/BANDAGES/DRESSINGS) IMPLANT
BNDG COHESIVE 6X5 TAN ST LF (GAUZE/BANDAGES/DRESSINGS) ×6 IMPLANT
BNDG ELASTIC 4X5.8 VLCR NS LF (GAUZE/BANDAGES/DRESSINGS) ×6 IMPLANT
BNDG ELASTIC 4X5.8 VLCR STR LF (GAUZE/BANDAGES/DRESSINGS) IMPLANT
BNDG ELASTIC 6INX 5YD STR LF (GAUZE/BANDAGES/DRESSINGS) IMPLANT
BNDG ELASTIC 6X10 VLCR STRL LF (GAUZE/BANDAGES/DRESSINGS) ×18 IMPLANT
BNDG GAUZE DERMACEA FLUFF 4 (GAUZE/BANDAGES/DRESSINGS) ×3 IMPLANT
BOWL SMART MIX CTS (DISPOSABLE) IMPLANT
COMPONENT FEM CMT PS STD 11 RT (Joint) IMPLANT
COMPONENT FEM PS STD 11 LT (Joint) IMPLANT
COMPONENT PATELLA 3 PEG 41 (Joint) IMPLANT
COMPONENT TIB KNEE PS G 0 RT (Joint) IMPLANT
COMPONET TIB PS G 0D LT (Joint) IMPLANT
COVER SURGICAL LIGHT HANDLE (MISCELLANEOUS) ×3 IMPLANT
CUFF TOURN SGL QUICK 42 (TOURNIQUET CUFF) IMPLANT
CUFF TRNQT CYL 34X4.125X (TOURNIQUET CUFF) ×6 IMPLANT
DRAPE BILATERAL LIMB T (DRAPES) ×3 IMPLANT
DRAPE C-ARM 42X120 X-RAY (DRAPES) IMPLANT
DRAPE C-ARM 42X72 X-RAY (DRAPES) IMPLANT
DRAPE HALF SHEET 40X57 (DRAPES) IMPLANT
DRAPE INCISE IOBAN 66X45 STRL (DRAPES) ×3 IMPLANT
DRAPE SURG ORHT 6 SPLT 77X108 (DRAPES) ×9 IMPLANT
DRAPE U-SHAPE 47X51 STRL (DRAPES) ×6 IMPLANT
DRSG AQUACEL AG ADV 3.5X10 (GAUZE/BANDAGES/DRESSINGS) ×6 IMPLANT
DRSG AQUACEL AG ADV 3.5X14 (GAUZE/BANDAGES/DRESSINGS) IMPLANT
DRSG EMULSION OIL 3X3 NADH (GAUZE/BANDAGES/DRESSINGS) ×3 IMPLANT
DURAPREP 26ML APPLICATOR (WOUND CARE) ×6 IMPLANT
ELECTRODE REM PT RTRN 9FT ADLT (ELECTROSURGICAL) ×3 IMPLANT
GAUZE PAD ABD 8X10 STRL (GAUZE/BANDAGES/DRESSINGS) ×12 IMPLANT
GAUZE SPONGE 4X4 12PLY STRL (GAUZE/BANDAGES/DRESSINGS) ×6 IMPLANT
GAUZE XEROFORM 1X8 LF (GAUZE/BANDAGES/DRESSINGS) ×6 IMPLANT
GAUZE XEROFORM 5X9 LF (GAUZE/BANDAGES/DRESSINGS) ×6 IMPLANT
GLOVE BIOGEL PI IND STRL 7.0 (GLOVE) ×3 IMPLANT
GLOVE BIOGEL PI IND STRL 8 (GLOVE) ×9 IMPLANT
GLOVE ECLIPSE 7.0 STRL STRAW (GLOVE) ×6 IMPLANT
GLOVE ECLIPSE 8.0 STRL XLNG CF (GLOVE) ×6 IMPLANT
GOWN STRL REUS W/ TWL LRG LVL3 (GOWN DISPOSABLE) ×9 IMPLANT
GOWN STRL REUS W/TWL 2XL LVL3 (GOWN DISPOSABLE) ×3 IMPLANT
HOOD W/PEELAWAY (MISCELLANEOUS) ×9 IMPLANT
IMMOBILIZER KNEE 20 THIGH 36 (SOFTGOODS) IMPLANT
IMMOBILIZER KNEE 22 UNIV (SOFTGOODS) ×6 IMPLANT
IMMOBILIZER KNEE 24 THIGH 36 (SOFTGOODS) IMPLANT
INSERT ARTISURF S8-11 18X22X14 (Insert) IMPLANT
INSERT TIBIAL PERSONA RT 11 (Joint) IMPLANT
KIT BASIN OR (CUSTOM PROCEDURE TRAY) ×3 IMPLANT
KIT TURNOVER KIT B (KITS) ×3 IMPLANT
MANIFOLD NEPTUNE II (INSTRUMENTS) ×3 IMPLANT
NDL 18GX1X1/2 (RX/OR ONLY) (NEEDLE) IMPLANT
NDL SPNL 18GX3.5 QUINCKE PK (NEEDLE) ×3 IMPLANT
NEEDLE 18GX1X1/2 (RX/OR ONLY) (NEEDLE) IMPLANT
NEEDLE SPNL 18GX3.5 QUINCKE PK (NEEDLE) ×2 IMPLANT
NS IRRIG 1000ML POUR BTL (IV SOLUTION) ×6 IMPLANT
PACK GENERAL/GYN (CUSTOM PROCEDURE TRAY) ×3 IMPLANT
PACK TOTAL JOINT (CUSTOM PROCEDURE TRAY) ×3 IMPLANT
PACK UNIVERSAL I (CUSTOM PROCEDURE TRAY) ×3 IMPLANT
PAD ARMBOARD POSITIONER FOAM (MISCELLANEOUS) ×6 IMPLANT
PAD CAST 4YDX4 CTTN HI CHSV (CAST SUPPLIES) ×6 IMPLANT
PADDING CAST COTTON 6X4 STRL (CAST SUPPLIES) ×12 IMPLANT
PIN DRILL HDLS TROCAR 75 4PK (PIN) IMPLANT
SCREW FEMALE HEX FIX 25X2.5 (ORTHOPEDIC DISPOSABLE SUPPLIES) IMPLANT
SET HNDPC FAN SPRY TIP SCT (DISPOSABLE) IMPLANT
SPIKE FLUID TRANSFER (MISCELLANEOUS) ×3 IMPLANT
SPONGE T-LAP 18X18 ~~LOC~~+RFID (SPONGE) ×6 IMPLANT
STAPLER SKIN 35 WIDE (STAPLE) IMPLANT
STAPLER SKIN PROX 35W (STAPLE) ×3 IMPLANT
STOCKINETTE IMPERVIOUS 9X36 MD (GAUZE/BANDAGES/DRESSINGS) ×3 IMPLANT
STRIP CLOSURE SKIN 1/2X4 (GAUZE/BANDAGES/DRESSINGS) ×15 IMPLANT
SUCTION TUBE FRAZIER 10FR DISP (SUCTIONS) ×3 IMPLANT
SUT ETHILON 4 0 FS 1 (SUTURE) IMPLANT
SUT MNCRL AB 3-0 PS2 18 (SUTURE) ×6 IMPLANT
SUT VIC AB 0 CT1 27XBRD ANBCTR (SUTURE) ×12 IMPLANT
SUT VIC AB 1 CT1 27XBRD ANBCTR (SUTURE) ×18 IMPLANT
SUT VIC AB 2-0 CT2 27 (SUTURE) ×12 IMPLANT
SYR 30ML LL (SYRINGE) ×9 IMPLANT
SYR TB 1ML LUER SLIP (SYRINGE) ×3 IMPLANT
TOWEL GREEN STERILE (TOWEL DISPOSABLE) ×6 IMPLANT
TOWEL GREEN STERILE FF (TOWEL DISPOSABLE) ×6 IMPLANT
TRAY CATH INTERMITTENT SS 16FR (CATHETERS) IMPLANT
TRAY FOLEY MTR SLVR 16FR STAT (SET/KITS/TRAYS/PACK) ×3 IMPLANT
WATER STERILE IRR 1000ML POUR (IV SOLUTION) ×3 IMPLANT

## 2024-04-14 NOTE — Brief Op Note (Signed)
   04/14/2024  1:21 PM  PATIENT:  Missy DELENA Freud  53 y.o. male  PRE-OPERATIVE DIAGNOSIS:  bilateral knee osteoarthritis  POST-OPERATIVE DIAGNOSIS:  bilateral knee osteoarthritis  PROCEDURE:  Procedure(s): ARTHROPLASTY, KNEE, BILATERAL, TOTAL REMOVAL, HARDWARE  SURGEON:  Surgeon(s): Addie, Cordella Hamilton, MD  ASSISTANT: Herlene Calix, PA  ANESTHESIA:   General  EBL: 125 ml    Total I/O In: 1870 [I.V.:1400; IV Piggyback:470] Out: 425 [Urine:300; Blood:125]  BLOOD ADMINISTERED: none  DRAINS: none   LOCAL MEDICATIONS USED: Marcaine  morphine  clonidine  Exparel  vancomycin  powder  SPECIMEN:  No Specimen  COUNTS:  YES  TOURNIQUET:  DICTATION: .Other Dictation: Dictation Number [id number]  PLAN OF CARE: Admit to inpatient   PATIENT DISPOSITION:  PACU - hemodynamically stable

## 2024-04-14 NOTE — Op Note (Signed)
 Roger Pace, Roger Pace MEDICAL RECORD NO: [id number] ACCOUNT NO: [id number] DATE OF BIRTH: 03-Sep-1970 FACILITY: [facility] LOCATION: [facility] PHYSICIAN: Cordella RAMAN. Addie, MD  Operative Report   DATE OF PROCEDURE: 04/14/2024  PREOPERATIVE DIAGNOSIS: Bilateral knee arthritis.  POSTOPERATIVE DIAGNOSIS: Bilateral knee arthritis.  PROCEDURE: Bilateral knee replacement utilizing Biomet components including left-sided size 11 cruciate retaining standard femur, size G spiked tibia press-fit, 41 mm press-fit patella, 10 mm medial congruent bearing. Right knee was 11 cruciate retaining  standard femur press fit size G spiked tibia press fit 41 mm patella, 11 mm medial congruent bearing. Removal of deep hardware from the right tibia.  SURGEON: Cordella RAMAN. Addie, MD  DESCRIPTION OF PROCEDURE: The patient was brought to the operating room where general anesthetic was induced. Preoperative antibiotics. Time out was called. Both legs were prescrubbed with alcohol and Betadine , allowed to air dry, and prepped with  DuraPrep solution and draped in a sterile manner. Ioban used to cover the operative field. After calling timeout, the left leg was elevated and exsanguinated with Esmarch wrap. Tourniquet was inflated. Anterior approach to the knee was made. Skin and  subcutaneous tissue were sharply divided. Irrisept solution was utilized. Median parapatellar arthrotomy was made and marked with #1 Vicryl suture. Irrisept solution was utilized in the joint. Next, the fat pad was partially excised. Lateral  patellofemoral ligament was released. Medial soft tissue dissection was performed proportional to the patient's mild to moderate *** left knee mild varus deformity. Soft tissue was removed from the anterior distal femur. Patella was everted. Osteophytes  were removed. Anterior horn of the lateral meniscus released. ACL released. Posterior retractor and medial and lateral collateral ligament retractors were placed.  Intramedullary alignment was then used to make a cut perpendicular to the mechanical axis  of initially set on a 2 mm cut off the medial tibial plateau, which we revised down 2 more mm to get under the sclerotic bone. Bone quality was excellent. Tibia was then cut using intramedullary alignment at 5 degrees of valgus. Initially, a 10-mm cut  was made, but that was later revised 2 more millimeters. With the revised cuts utilized, the patient did achieve full extension with a 10-mm spacer block. The femur was incised to a size 11. Guide was placed. No notching. Anterior, posterior, and chamfer  cuts were made and 30 degrees of external rotation, which gave symmetric flexion and extension gaps. Rotation of the tibial tray was then made in alignment with the medial third of the tibial tubercle. Osteophytes were removed on that medial tibial  plateau. Pins were placed. With the trial tibial tray in, reduction was performed with a 10-mm polyethylene liner and the size 11 femur. The patella was then cut down about 10 mm off of the patella and it was sized to a size 41. With the patella trial in  place and other trial components in place including a 10-mm spacer, the patient achieved full extension, full flexion, slight lift-off, which we corrected by releasing a little bit of the PCL from the tibia. After that was done, there was no lift-off.  Excellent stability to varus and valgus stress at 0, 30, and 90 degrees. Patella tracked well with no thumbs technique. Final preparation was made on the femur and tibia. Trial components were then removed. Thorough irrigation was performed with 3 liters  of pulsatile irrigating solution. Capsule was anesthetized using a combination of Marcaine , saline, clonidine , and Exparel . Then, we allowed TXA sponge and Irrisept solution to sit in  the knee for 3 minutes. Those were then removed. The tibial canal was  irrigated with Irrisept solution and vancomycin  powder placed. The  tibial component was placed. The tray was placed onto the tibial component. The femur was then placed with excellent press fit obtained. Patella was then placed. Knee was taken through a  range of motion and found to have very good stability to varus and valgus stress. Achieved full extension, full flexion, and excellent patellar tracking using no thumbs technique. Tourniquet was released at this time. Bleeding points were encountered  was closed with electrocautery. Pouring irrigation x3 liters utilized. Next, this incision was closed over a bolster using #1 Vicryl suture. Prior to final closure, we irrigated the knee joint out again with Irrisept solution and placed vancomycin  powder  into the knee joint and then the arthrotomy was completely closed using #1 Vicryl suture. We then irrigated above that using Irrisept solution followed by vancomycin  powder followed by 0 Vicryl suture, 2-0 Vicryl suture, and 3-0 Monocryl with  Steri-Strips. Aquacel dressing applied. Attention was then directed towards the right knee. Leg was elevated and exsanguinated with the Esmarch wrap. The patient had about a 10-12 degree flexion contracture in the right knee. The anterior approach was  made. Prior incision was utilized. Skin and subcutaneous tissue were sharply divided. Median parapatellar approach was made and marked with a #1 Vicryl suture. Severe arthritis was present. More severe in this knee than the left knee. The medial soft  tissue dissection was performed in order to allow for exploration of that proximal medial plateau where hardware was present, which would prevent intramedullary alignment as well as seating of the implant. Lateral patellofemoral ligament was released.  Soft tissue was removed from the anterior distal femur. At this time, bone quality was also excellent on the right knee. Under fluoroscopic evaluation and guidance, we cut a 1.5 mm three-sided cortical window at the location of the screw. We  maintained  that cortical piece on a soft tissue hinge. The screw was then localized and removed. Next, we proceeded with the case. Femoral screw was not visible or palpable and was not in the way of the implant, so it remained in place. Next, intramedullary  alignment was used to make a cut on the tibia perpendicular to the mechanical axis. Initial cut was 10 mm off that medial tibial plateau and that was later revised 2 more mm. Initial cut was 12 mm in order to actually cut some of that lateral tibial  plateau. That was later revised 2 more millimeters in order to achieve full extension with trial implants. After those cuts had been made, we did achieve full extension with both a 10 and a 12 mm spacer. Next, the femur was sized to a size 11. We placed  it in about 3.5-4 degrees of external rotation. Anterior, posterior, and chamfer cuts were made with no notching. The tibial component was then placed in line with the medial third of the tibial tubercle. That was the same size as the left knee and had  good peripheral cortical rim contact. The femur was then placed as well and the patient did achieve nice extension with a 10 mm spacer. Patella was then cut down from 28 to 17 mm and a 3 peg patella trial was placed. With trial components in position,  the patient had full extension with both the 10 and 11 mm spacer. Slightly better stability to varus and valgus stress with the 11 mm spacer. Trial  components removed after completing preparation on both the femur and the tibia. Thorough irrigation was  performed with 3 liters of pulsatile irrigating solution. A solution of Marcaine , clonidine , Exparel  and saline injected into the capsule of the knee. We then placed vancomycin  powder into the tibial canal. We also bone grafted a cyst in the posterior  aspect of the tibial plateau. The tibial component was placed, femoral component placed, and then the liner was placed, which was an 11 mm liner. This gave the  patient near full extension along with very good flexion with no lift-off. At this time,  patella was also placed with a good press fit obtained. Same stability parameters and excellent patellar tracking maintained. Tourniquet was released at this time. Bleeding points encountered controlled using electrocautery. Pouring irrigation x3 liters  utilized on the right knee. We then bone grafted after irrigation that defect on the proximal medial tibia. After bone grafting around that implant where the screw was, the cortical window was reestablished and tapped into position. Press fit nicely with  secure press fit obtained. Next, the median parapatellar arthrotomy was closed over a bolster using #1 Vicryl suture. Prior to final closure, we irrigated out the knee joint again with Irrisept solution and placed in some vancomycin  powder after we  suctioned out the Irrisept solution and then closed up the arthrotomy using the #1 Vicryl suture. Next, irrigated on top of the incision with Irrisept solution, placed in vancomycin  powder, and then closed it up using 0 Vicryl suture, 2-0 Vicryl suture,  and 3-0 Monocryl with Steri-Strips, Aquacel dressing and knee immobilizer placed. Following this, the alignment of both knees looked good. The patient was transferred to the recovery room in stable condition. Luke's assistance was required at all times  for retraction, opening, closing, mobilization of tissue. His assistance was of medical necessity.    SUJ D: 04/14/2024 1:36:16 pm T: 04/14/2024 10:51:00 pm  JOB: [id number]/ [id number]

## 2024-04-14 NOTE — Progress Notes (Signed)
 Orthopedic Tech Progress Note Patient Details:  DREQUAN IRONSIDE [birth year] [id number]  CPM Left Knee CPM Left Knee: On Left Knee Flexion (Degrees): 10 Left Knee Extension (Degrees): 40  Post Interventions Patient Tolerated: Well Instructions Provided: Care of device Ortho Devices Type of Ortho Device: Bone foam zero knee Ortho Device/Splint Location: RLE Ortho Device/Splint Interventions: Ordered, Application patient was leaving [facility] when I went to apply CPM, but I only brought up 1 each so when he is in the CPM the other leg could be in the bone foam    Post Interventions Patient Tolerated: Well Instructions Provided: Care of device  Delanna LITTIE Pac 04/14/2024, 3:51 PM

## 2024-04-14 NOTE — H&P (Signed)
 TOTAL KNEE ADMISSION H&P  Patient is being admitted for bilateral total knee arthroplasty.  Subjective:  Chief Complaint:bilateral knee pain.  HPI: Roger Pace, 53 y.o. male, has a history of pain and functional disability in the both knees due to arthritis and has failed non-surgical conservative treatments for greater than 12 weeks to includeNSAID's and/or analgesics, corticosteriod injections, viscosupplementation injections, flexibility and strengthening excercises, and activity modification.  Onset of symptoms was gradual, starting >10 years ago with gradually worsening course since that time. The patient noted acl recon on right and doa/menisectomy on left  knee(s).  Patient currently rates pain in the both knee(s) at 8 out of 10 with activity. Patient has night pain, worsening of pain with activity and weight bearing, pain that interferes with activities of daily living, pain with passive range of motion, crepitus, and joint swelling.  Patient has evidence of subchondral sclerosis and joint space narrowing by imaging studies. This patient has had a long course of non op treatment with no personal or family h/o dvt or pe.. There is no active infection.   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  Hardware is present in the right knee which would likely need to be removed at least on the tibial side.  Patient would like to have bilateral knee replacements.  I think he is a reasonable candidate for that intervention.  No significant health issues or medical comorbidities at this time.  Plan is for bilateral knee replacements starting with the left knee.  The risk and benefits are discussed with the patient include not limited to infection or vessel damage knee stiffness incomplete pain relief as well as incomplete functional restoration.  The rigorous nature of the rehabilitative process for both knees is also discussed.  He would like to go through rehab only once.  Nonetheless  this will be a significant hurdle for him to overcome particularly the first week or 2 in terms of gaining range of motion.  Patient understands risk and benefits and wishes to proceed.  Anticipate at least 2-3 nights in the hospital to become functional prior to going back to his third floor apartment.  All questions answered.    Patient Active Problem List   Diagnosis Date Noted   Class 2 obesity without serious comorbidity with body mass index (BMI) of 38.0 to 38.9 in adult 12/22/2018   Elevated LFTs 12/22/2018   Erectile dysfunction 12/22/2018   Past Medical History:  Diagnosis Date   Arthritis     Past Surgical History:  Procedure Laterality Date   KNEE SURGERY Bilateral    arthroscopy on each knee, and reconstruction on right knee   LAPAROSCOPY N/A 09/18/2023   Procedure: DIAGNOSTIC LAPAROSCOPY;  Surgeon: Ebbie Cough, MD;  Location: [facility] OR;  Service: General;  Laterality: N/A;   SHOULDER SURGERY Left    repaired humerus (shoulder kept dislocating)   UMBILICAL HERNIA REPAIR N/A 09/18/2023   Procedure: UMBILICAL HERNIA REPAIR;  Surgeon: Ebbie Cough, MD;  Location: [facility] OR;  Service: General;  Laterality: N/A;  RNFA GEN & TAP BLOCK    Current Facility-Administered Medications  Medication Dose Route Frequency Provider Last Rate Last Admin   ceFAZolin  (ANCEF ) 3-4 GM/150ML-% IVPB            ceFAZolin  (ANCEF ) IVPB 3g/150 mL premix  3 g Intravenous On Call to OR Addie Cordella Hamilton, MD       cloNIDine  (DURACLON ) 100 mcg/mL inj- OR Only  150 mcg Intra-articular Once Addie Cordella Hamilton, MD  cloNIDine  (DURACLON ) 100 mcg/mL inj- OR Only  150 mcg Intra-articular Once Addie Cordella Hamilton, MD       lactated ringers  infusion   Intravenous Continuous Woodrum, Chelsey L, MD       povidone-iodine  (BETADINE ) 7.5 % scrub   Topical Once Magnant, Charles L, PA-C       povidone-iodine  10 % swab 2 Application  2 Application Topical Once Magnant, Charles L, PA-C       povidone-iodine  10  % swab 2 Application  2 Application Topical Once Magnant, Charles L, PA-C       tranexamic acid  (CYKLOKAPRON ) 2,000 mg in sodium chloride  0.9 % 50 mL Topical Application  2,000 mg Topical To OR Magnant, Charles L, PA-C       tranexamic acid  (CYKLOKAPRON ) IVPB 1,000 mg  1,000 mg Intravenous To OR Magnant, Charles L, PA-C       Allergies  Allergen Reactions   Shellfish Allergy Hives    Social History   Tobacco Use   Smoking status: Never   Smokeless tobacco: Never  Substance Use Topics   Alcohol use: No    Family History  Problem Relation Age of Onset   Hypertension Mother    Irregular heart beat Mother    Osteoarthritis Mother        Knees   Diabetes Father    Hypertension Father    Other Father        Elevated LFT   Prostate cancer Father    Cancer Father    Leukemia Sister    Cancer Sister    Arrhythmia Brother    Hyperlipidemia Brother    Hypertension Brother    Prostate cancer Maternal Uncle    Cancer Maternal Uncle    Prostate cancer Paternal Uncle    Cancer Paternal Uncle      Review of Systems  Musculoskeletal:  Positive for arthralgias.  All other systems reviewed and are negative.   Objective:  Physical Exam Vitals reviewed.  HENT:     Head: Normocephalic.     Nose: Nose normal.     Mouth/Throat:     Mouth: Mucous membranes are moist.  Eyes:     Pupils: Pupils are equal, round, and reactive to light.  Cardiovascular:     Rate and Rhythm: Normal rate.     Pulses: Normal pulses.  Pulmonary:     Effort: Pulmonary effort is normal.  Abdominal:     General: Abdomen is flat.  Musculoskeletal:     Cervical back: Normal range of motion.  Skin:    General: Skin is warm.     Capillary Refill: Capillary refill takes less than 2 seconds.  Neurological:     General: No focal deficit present.     Mental Status: He is alert.  Psychiatric:        Mood and Affect: Mood normal.   Ortho exam demonstrates well-healed surgical incision on that right knee.  Lacks about 5 to 7 degrees of full extension in both knees but can flex to about 110 on both sides. Collateral and cruciate ligaments are stable. Patellofemoral crepitus is present. Pedal pulses palpable. Ankle dorsiflexion intact bilaterally. No groin pain with internal/external Tatian of either leg. Mild effusions present in both knees. Overall the alignment looks pretty reasonable when standing in both knees   Vital signs in last 24 hours: Temp:  [97.7 F (36.5 C)] 97.7 F (36.5 C) (08/26 0546) Pulse Rate:  [79] 79 (08/26 0546) Resp:  [20] 20 (08/26 0546)  BP: (142)/(85) 142/85 (08/26 0546) SpO2:  [98 %] 98 % (08/26 0546) Weight:  [117.9 kg] 117.9 kg (08/26 0546)  Labs:   Estimated body mass index is 35.76 kg/m as calculated from the following:   Height as of this encounter: 5' 11.5 (1.816 m).   Weight as of this encounter: 117.9 kg.   Imaging Review Plain radiographs demonstrate severe degenerative joint disease of both  knee(s). The overall alignment isneutral on the right and mild varus left The bone quality appears to be good for age and reported activity level.      Assessment/Plan:  End stage arthritis, both  knee   The patient history, physical examination, clinical judgment of the provider and imaging studies are consistent with end stage degenerative joint disease of the left and right knee) and total knee arthroplasty is deemed medically necessary. The treatment options including medical management, injection therapy arthroscopy and arthroplasty were discussed at length. The risks and benefits of total knee arthroplasty were presented and reviewed. The risks due to aseptic loosening, infection, stiffness, patella tracking problems, thromboembolic complications and other imponderables were discussed. The patient acknowledged the explanation, agreed to proceed with the plan and consent was signed. Patient is being admitted for inpatient treatment for surgery, pain control,  PT, OT, prophylactic antibiotics, VTE prophylaxis, progressive ambulation and ADL's and discharge planning. The patient is planning to be discharged home with home health services    Anticipated LOS equal to or greater than 2 midnights due to - Age 50 and older with one or more of the following:  - Obesity  - Expected need for hospital services (PT, OT, Nursing) required for safe  discharge  - Anticipated need for postoperative skilled nursing care or inpatient rehab  - Active co-morbidities: None OR   - Unanticipated findings during/Post Surgery: unlikrly to be mobile enough for d/c within 48 hours.  - Patient is a high risk of re-admission due to: None

## 2024-04-14 NOTE — Anesthesia Procedure Notes (Addendum)
 Procedure Name: Intubation Date/Time: 04/14/2024 7:35 AM  Performed by: Lanning Cena RAMAN, CRNAPre-anesthesia Checklist: Patient identified, Emergency Drugs available, Suction available, Patient being monitored and Timeout performed Patient Re-evaluated:Patient Re-evaluated prior to induction Oxygen Delivery Method: Circle system utilized Preoxygenation: Pre-oxygenation with 100% oxygen Induction Type: IV induction Ventilation: Mask ventilation without difficulty Laryngoscope Size: Miller and 3 Grade View: Grade II Tube type: Oral Tube size: 7.5 mm Number of attempts: 1 Airway Equipment and Method: Stylet Placement Confirmation: ETT inserted through vocal cords under direct vision, positive ETCO2, CO2 detector and breath sounds checked- equal and bilateral Secured at: 23 cm Tube secured with: Tape Dental Injury: Teeth and Oropharynx as per pre-operative assessment

## 2024-04-14 NOTE — Evaluation (Signed)
 Physical Therapy Evaluation Patient Details Name: Roger Pace MRN: [id number] DOB: 19-May-1971 Today's Date: 04/14/2024  History of Present Illness  53 y.o. male presents to [facility] on 04/14/2024 for elective bilateral TKA. PMH includes umbilical hernia repair, OA.  Clinical Impression  Pt presents to PT with deficits in functional mobility, gait, balance, strength, ROM. Pt currently requires physical assistance to ascend into standing, once standing pt tolerates ambulation for short household distances with support of RW. PT provides education on the TKR exercise packet and encourages frequent mobilization with staff assistance. PT will follow up tomorrow for a progression of gait and transfer training.        If plan is discharge home, recommend the following: A little help with walking and/or transfers;A little help with bathing/dressing/bathroom;Assistance with cooking/housework;Assist for transportation;Help with stairs or ramp for entrance   Can travel by private vehicle        Equipment Recommendations Rolling walker (2 wheels);BSC/3in1  Recommendations for Other Services       Functional Status Assessment Patient has had a recent decline in their functional status and demonstrates the ability to make significant improvements in function in a reasonable and predictable amount of time.     Precautions / Restrictions Precautions Precautions: Fall;Knee Precaution Booklet Issued: Yes (comment) Recall of Precautions/Restrictions: Intact Required Braces or Orthoses: Knee Immobilizer - Right (per Dr. Addie, try to alternate knee immobilizer between legs to allow each leg the chance to bear more of the load when mobilizing) Restrictions Weight Bearing Restrictions Per Provider Order: Yes RLE Weight Bearing Per Provider Order: Weight bearing as tolerated LLE Weight Bearing Per Provider Order: Weight bearing as tolerated      Mobility  Bed Mobility Overal bed mobility:  Needs Assistance Bed Mobility: Supine to Sit, Sit to Supine     Supine to sit: Supervision Sit to supine: Min assist        Transfers Overall transfer level: Needs assistance Equipment used: Rolling walker (2 wheels) Transfers: Sit to/from Stand Sit to Stand: Min assist, From elevated surface                Ambulation/Gait Ambulation/Gait assistance: Contact guard assist Gait Distance (Feet): 30 Feet Assistive device: Rolling walker (2 wheels) Gait Pattern/deviations: Step-to pattern Gait velocity: reduced Gait velocity interpretation: <1.8 ft/sec, indicate of risk for recurrent falls   General Gait Details: slowed step-to gait, reduced single leg stance time bilaterally  Stairs            Wheelchair Mobility     Tilt Bed    Modified Rankin (Stroke Patients Only)       Balance Overall balance assessment: Needs assistance Sitting-balance support: No upper extremity supported, Feet supported Sitting balance-Leahy Scale: Good     Standing balance support: Bilateral upper extremity supported, Reliant on assistive device for balance Standing balance-Leahy Scale: Poor                               Pertinent Vitals/Pain Pain Assessment Pain Assessment: 0-10 Pain Score: 4  Pain Location: [name] Pain Descriptors / Indicators: Sore Pain Intervention(s): Monitored during session    Home Living Family/patient expects to be discharged to:: Private residence Living Arrangements: Spouse/significant other Available Help at Discharge: Family Type of Home: Apartment Home Access: Elevator;Level entry       Home Layout: One level Home Equipment: None      Prior Function Prior Level of Function :  Independent/Modified Independent;Working/employed;Driving             Mobility Comments: works for [employer]       Extremity/Trunk Assessment   Upper Extremity Assessment Upper Extremity Assessment: Overall WFL for  tasks assessed    Lower Extremity Assessment Lower Extremity Assessment: RLE deficits/detail;LLE deficits/detail RLE Deficits / Details: generalized weakness, ROM not fully assessed as pt in knee immobilizer LLE Deficits / Details: generalized weakness and knee ROM deficits as anticipated on POD 0    Cervical / Trunk Assessment Cervical / Trunk Assessment: Normal  Communication   Communication Communication: No apparent difficulties    Cognition Arousal: Alert Behavior During Therapy: WFL for tasks assessed/performed   PT - Cognitive impairments: No apparent impairments                         Following commands: Intact       Cueing Cueing Techniques: Verbal cues     General Comments General comments (skin integrity, edema, etc.): VSS on RA    Exercises Other Exercises Other Exercises: PT provides education on TKA exercise packet   Assessment/Plan    PT Assessment Patient needs continued PT services  PT Problem List Decreased strength;Decreased range of motion;Decreased activity tolerance;Decreased balance;Decreased mobility;Decreased knowledge of use of DME;Pain       PT Treatment Interventions DME instruction;Gait training;Stair training;Functional mobility training;Therapeutic activities;Balance training;Therapeutic exercise;Neuromuscular re-education;Patient/family education    PT Goals (Current goals can be found in the Care Plan section)  Acute Rehab PT Goals Patient Stated Goal: to return to independence PT Goal Formulation: With patient Time For Goal Achievement: 04/18/24 Potential to Achieve Goals: Good    Frequency 7X/week     Co-evaluation               AM-PAC PT 6 Clicks Mobility  Outcome Measure Help needed turning from your back to your side while in a flat bed without using bedrails?: A Little Help needed moving from lying on your back to sitting on the side of a flat bed without using bedrails?: A Little Help needed moving  to and from a bed to a chair (including a wheelchair)?: A Little Help needed standing up from a chair using your arms (e.g., wheelchair or bedside chair)?: A Little Help needed to walk in hospital room?: A Little Help needed climbing 3-5 steps with a railing? : A Lot 6 Click Score: 17    End of Session Equipment Utilized During Treatment: Gait belt Activity Tolerance: Patient tolerated treatment well Patient left: in bed;with call bell/phone within reach;with bed alarm set Nurse Communication: Mobility status PT Visit Diagnosis: Other abnormalities of gait and mobility (R26.89);Muscle weakness (generalized) (M62.81);Pain Pain - Right/Left: Right Pain - part of body: Knee    Time: 8364-8284 PT Time Calculation (min) (ACUTE ONLY): 40 min   Charges:   PT Evaluation $PT Eval Low Complexity: 1 Low   PT General Charges $$ ACUTE PT VISIT: 1 Visit         Bernardino JINNY Ruth, PT, DPT Acute Rehabilitation Office [phone]   Bernardino JINNY Ruth 04/14/2024, 5:36 PM

## 2024-04-14 NOTE — Progress Notes (Signed)
 Patient arrived to unit from PACU, he is alert and verbally speaking to staff, he is on room air, pt oriented to room, ortho tech brought CPM machine and bone foam, call bell within reach, bed in low position, wheels locked, and bed alarm on.

## 2024-04-14 NOTE — Anesthesia Procedure Notes (Signed)
 Anesthesia Regional Block: Adductor canal block   Pre-Anesthetic Checklist: , timeout performed,  Correct Patient, Correct Site, Correct Laterality,  Correct Procedure, Correct Position, site marked,  Risks and benefits discussed,  Pre-op evaluation,  At surgeon's request and post-op pain management  Laterality: Left  Prep: Maximum Sterile Barrier Precautions used, chloraprep       Needles:  Injection technique: Single-shot  Needle Type: Echogenic Stimulator Needle     Needle Length: 9cm  Needle Gauge: 21     Additional Needles:   Procedures:,,,, ultrasound used (permanent image in chart),,    Narrative:  Start time: 04/14/2024 7:05 AM End time: 04/14/2024 7:08 AM Injection made incrementally with aspirations every 5 mL. Anesthesiologist: Niels Marien CROME, MD

## 2024-04-14 NOTE — Anesthesia Procedure Notes (Signed)
 Anesthesia Regional Block: Adductor canal block   Pre-Anesthetic Checklist: , timeout performed,  Correct Patient, Correct Site, Correct Laterality,  Correct Procedure, Correct Position, site marked,  Risks and benefits discussed,  Pre-op evaluation,  At surgeon's request and post-op pain management  Laterality: Right  Prep: Maximum Sterile Barrier Precautions used, chloraprep       Needles:  Injection technique: Single-shot  Needle Type: Echogenic Stimulator Needle     Needle Length: 9cm  Needle Gauge: 21     Additional Needles:   Procedures:,,,, ultrasound used (permanent image in chart),,    Narrative:  Start time: 04/14/2024 7:08 AM End time: 04/14/2024 7:11 AM Injection made incrementally with aspirations every 5 mL. Anesthesiologist: Niels Marien CROME, MD

## 2024-04-14 NOTE — Transfer of Care (Signed)
 Immediate Anesthesia Transfer of Care Note  Patient: Roger Pace  Procedure(s) Performed: ARTHROPLASTY, KNEE, BILATERAL, TOTAL (Bilateral: Knee) REMOVAL, HARDWARE (Right)  Patient Location: PACU  Anesthesia Type:GA combined with regional for post-op pain  Level of Consciousness: drowsy  Airway & Oxygen Therapy: Patient Spontanous Breathing and Patient connected to face mask oxygen  Post-op Assessment: Report given to RN and Post -op Vital signs reviewed and stable  Post vital signs: Reviewed and stable  Last Vitals:  Vitals Value Taken Time  BP 146/65 04/14/24 13:10  Temp    Pulse 98 04/14/24 13:14  Resp 20 04/14/24 13:14  SpO2 97 % 04/14/24 13:14  Vitals shown include unfiled device data.  Last Pain:  Vitals:   04/14/24 0546  TempSrc: Oral  PainSc: 5       Patients Stated Pain Goal: 0 (04/14/24 0546)  Complications: No notable events documented.

## 2024-04-15 ENCOUNTER — Encounter (HOSPITAL_COMMUNITY): Payer: Self-pay | Admitting: Orthopedic Surgery

## 2024-04-15 DIAGNOSIS — I82459 Acute embolism and thrombosis of unspecified peroneal vein: Secondary | ICD-10-CM | POA: Diagnosis not present

## 2024-04-15 DIAGNOSIS — K5900 Constipation, unspecified: Secondary | ICD-10-CM | POA: Diagnosis not present

## 2024-04-15 DIAGNOSIS — E871 Hypo-osmolality and hyponatremia: Secondary | ICD-10-CM | POA: Diagnosis present

## 2024-04-15 DIAGNOSIS — D62 Acute posthemorrhagic anemia: Secondary | ICD-10-CM | POA: Diagnosis not present

## 2024-04-15 DIAGNOSIS — R509 Fever, unspecified: Secondary | ICD-10-CM | POA: Diagnosis not present

## 2024-04-15 DIAGNOSIS — Z83438 Family history of other disorder of lipoprotein metabolism and other lipidemia: Secondary | ICD-10-CM | POA: Diagnosis not present

## 2024-04-15 DIAGNOSIS — M25561 Pain in right knee: Secondary | ICD-10-CM | POA: Diagnosis not present

## 2024-04-15 DIAGNOSIS — D72829 Elevated white blood cell count, unspecified: Secondary | ICD-10-CM | POA: Diagnosis not present

## 2024-04-15 DIAGNOSIS — D72823 Leukemoid reaction: Secondary | ICD-10-CM | POA: Diagnosis not present

## 2024-04-15 DIAGNOSIS — M170 Bilateral primary osteoarthritis of knee: Secondary | ICD-10-CM | POA: Diagnosis present

## 2024-04-15 DIAGNOSIS — I2699 Other pulmonary embolism without acute cor pulmonale: Secondary | ICD-10-CM | POA: Diagnosis present

## 2024-04-15 DIAGNOSIS — R9431 Abnormal electrocardiogram [ECG] [EKG]: Secondary | ICD-10-CM | POA: Diagnosis not present

## 2024-04-15 DIAGNOSIS — M67971 Unspecified disorder of synovium and tendon, right ankle and foot: Secondary | ICD-10-CM | POA: Diagnosis not present

## 2024-04-15 DIAGNOSIS — M7989 Other specified soft tissue disorders: Secondary | ICD-10-CM | POA: Diagnosis not present

## 2024-04-15 DIAGNOSIS — I951 Orthostatic hypotension: Secondary | ICD-10-CM | POA: Diagnosis present

## 2024-04-15 DIAGNOSIS — Z8249 Family history of ischemic heart disease and other diseases of the circulatory system: Secondary | ICD-10-CM | POA: Diagnosis not present

## 2024-04-15 DIAGNOSIS — Z833 Family history of diabetes mellitus: Secondary | ICD-10-CM | POA: Diagnosis not present

## 2024-04-15 DIAGNOSIS — R7401 Elevation of levels of liver transaminase levels: Secondary | ICD-10-CM | POA: Diagnosis not present

## 2024-04-15 DIAGNOSIS — Z79899 Other long term (current) drug therapy: Secondary | ICD-10-CM | POA: Diagnosis not present

## 2024-04-15 DIAGNOSIS — M25562 Pain in left knee: Secondary | ICD-10-CM | POA: Diagnosis not present

## 2024-04-15 DIAGNOSIS — E66812 Obesity, class 2: Secondary | ICD-10-CM | POA: Diagnosis present

## 2024-04-15 DIAGNOSIS — I82453 Acute embolism and thrombosis of peroneal vein, bilateral: Secondary | ICD-10-CM | POA: Diagnosis not present

## 2024-04-15 DIAGNOSIS — I82443 Acute embolism and thrombosis of tibial vein, bilateral: Secondary | ICD-10-CM | POA: Diagnosis not present

## 2024-04-15 DIAGNOSIS — Z7901 Long term (current) use of anticoagulants: Secondary | ICD-10-CM | POA: Diagnosis not present

## 2024-04-15 DIAGNOSIS — Z4789 Encounter for other orthopedic aftercare: Secondary | ICD-10-CM | POA: Diagnosis not present

## 2024-04-15 DIAGNOSIS — G8929 Other chronic pain: Secondary | ICD-10-CM | POA: Diagnosis not present

## 2024-04-15 DIAGNOSIS — Z6837 Body mass index (BMI) 37.0-37.9, adult: Secondary | ICD-10-CM | POA: Diagnosis not present

## 2024-04-15 DIAGNOSIS — Z96653 Presence of artificial knee joint, bilateral: Secondary | ICD-10-CM | POA: Diagnosis not present

## 2024-04-15 DIAGNOSIS — Y848 Other medical procedures as the cause of abnormal reaction of the patient, or of later complication, without mention of misadventure at the time of the procedure: Secondary | ICD-10-CM | POA: Diagnosis not present

## 2024-04-15 DIAGNOSIS — T8172XA Complication of vein following a procedure, not elsewhere classified, initial encounter: Secondary | ICD-10-CM | POA: Diagnosis not present

## 2024-04-15 DIAGNOSIS — D75839 Thrombocytosis, unspecified: Secondary | ICD-10-CM | POA: Diagnosis not present

## 2024-04-15 DIAGNOSIS — Z6838 Body mass index (BMI) 38.0-38.9, adult: Secondary | ICD-10-CM | POA: Diagnosis not present

## 2024-04-15 DIAGNOSIS — Z91013 Allergy to seafood: Secondary | ICD-10-CM | POA: Diagnosis not present

## 2024-04-15 MED ORDER — METOPROLOL TARTRATE 5 MG/5ML IV SOLN
2.5000 mg | Freq: Four times a day (QID) | INTRAVENOUS | Status: DC | PRN
  Administered 2024-04-15 – 2024-04-16 (×2): 2.5 mg via INTRAVENOUS
  Filled 2024-04-15 (×2): qty 5

## 2024-04-15 NOTE — Anesthesia Postprocedure Evaluation (Signed)
 Anesthesia Post Note  Patient: Missy DELENA Freud  Procedure(s) Performed: ARTHROPLASTY, KNEE, BILATERAL, TOTAL (Bilateral: Knee) REMOVAL, HARDWARE (Right)     Patient location during evaluation: PACU Anesthesia Type: Regional and General Level of consciousness: awake and alert Pain management: pain level controlled Vital Signs Assessment: post-procedure vital signs reviewed and stable Respiratory status: spontaneous breathing, nonlabored ventilation, respiratory function stable and patient connected to nasal cannula oxygen Cardiovascular status: blood pressure returned to baseline and stable Postop Assessment: no apparent nausea or vomiting Anesthetic complications: no   No notable events documented.  Last Vitals:  Vitals:   04/15/24 0507 04/15/24 0816  BP: (!) 152/62 (!) 167/89  Pulse: 95 90  Resp: 18 18  Temp: 36.7 C 36.7 C  SpO2: 98% 100%    Last Pain:  Vitals:   04/15/24 1241  TempSrc:   PainSc: 2                  Destyni Hoppel L Bruno Leach

## 2024-04-15 NOTE — Progress Notes (Signed)
 Physical Therapy Treatment Patient Details Name: Roger Pace MRN: [id number] DOB: 12-20-1970 Today's Date: 04/15/2024   History of Present Illness 53 y.o. male presents to [facility] hospital on 04/14/2024 for elective bilateral TKA. PMH includes umbilical hernia repair, OA.    PT Comments  Pt tolerates treatment well but continues to require physical assistance to ascend into standing, due to lack of strength and knee flexion ROM deficits. Pt remains able to ambulate for household distances at this time. Pt will benefit from more frequent mobilization, including ambulating to the bathroom with staff when needed, in an effort to reduce stiffness and improve ROM in knees. PT will continue to follow.   If plan is discharge home, recommend the following: A little help with walking and/or transfers;A little help with bathing/dressing/bathroom;Assistance with cooking/housework;Assist for transportation;Help with stairs or ramp for entrance   Can travel by private vehicle        Equipment Recommendations  Rolling walker (2 wheels);BSC/3in1    Recommendations for Other Services       Precautions / Restrictions Precautions Precautions: Fall;Knee Precaution Booklet Issued: Yes (comment) Recall of Precautions/Restrictions: Intact Required Braces or Orthoses: Knee Immobilizer - Right Restrictions Weight Bearing Restrictions Per Provider Order: Yes RLE Weight Bearing Per Provider Order: Weight bearing as tolerated LLE Weight Bearing Per Provider Order: Weight bearing as tolerated     Mobility  Bed Mobility Overal bed mobility: Needs Assistance Bed Mobility: Supine to Sit, Sit to Supine     Supine to sit: Min assist Sit to supine: Min assist        Transfers Overall transfer level: Needs assistance Equipment used: Rolling walker (2 wheels) Transfers: Sit to/from Stand Sit to Stand: Min assist, From elevated surface                Ambulation/Gait Ambulation/Gait assistance:  Contact guard assist Gait Distance (Feet): 140 Feet Assistive device: Rolling walker (2 wheels) Gait Pattern/deviations: Step-through pattern Gait velocity: reduced Gait velocity interpretation: <1.8 ft/sec, indicate of risk for recurrent falls   General Gait Details: slowed step-through gait, 2 brief standing breaks due to UE fatigue. One instance of mild buckling of L knee.   Stairs             Wheelchair Mobility     Tilt Bed    Modified Rankin (Stroke Patients Only)       Balance Overall balance assessment: Needs assistance Sitting-balance support: No upper extremity supported, Feet supported Sitting balance-Leahy Scale: Good     Standing balance support: Bilateral upper extremity supported, Reliant on assistive device for balance Standing balance-Leahy Scale: Poor                              Communication Communication Communication: No apparent difficulties  Cognition Arousal: Alert Behavior During Therapy: WFL for tasks assessed/performed   PT - Cognitive impairments: No apparent impairments                         Following commands: Intact      Cueing Cueing Techniques: Verbal cues  Exercises Total Joint Exercises Goniometric ROM: [name] flexion 65, extension -6. L knee flexion 57, extension -4 Other Exercises Other Exercises: PT encourages performance of HEP exercises in supine later this evening    General Comments General comments (skin integrity, edema, etc.): VSS on RA      Pertinent Vitals/Pain Pain Assessment Pain Assessment: Faces Pain  Score: 4  Faces Pain Scale: Hurts even more Pain Location: knees Pain Descriptors / Indicators: Sore Pain Intervention(s): Monitored during session    Home Living                          Prior Function            PT Goals (current goals can now be found in the care plan section) Acute Rehab PT Goals Patient Stated Goal: to return to independence Progress  towards PT goals: Progressing toward goals    Frequency    7X/week      PT Plan      Co-evaluation              AM-PAC PT 6 Clicks Mobility   Outcome Measure  Help needed turning from your back to your side while in a flat bed without using bedrails?: A Little Help needed moving from lying on your back to sitting on the side of a flat bed without using bedrails?: A Little Help needed moving to and from a bed to a chair (including a wheelchair)?: A Little Help needed standing up from a chair using your arms (e.g., wheelchair or bedside chair)?: A Little Help needed to walk in hospital room?: A Little Help needed climbing 3-5 steps with a railing? : A Lot 6 Click Score: 17    End of Session Equipment Utilized During Treatment: Gait belt Activity Tolerance: Patient tolerated treatment well Patient left: in bed;with call bell/phone within reach;with bed alarm set;with family/visitor present Nurse Communication: Mobility status PT Visit Diagnosis: Other abnormalities of gait and mobility (R26.89);Muscle weakness (generalized) (M62.81);Pain Pain - Right/Left: Right Pain - part of body: Knee     Time: 1345-1418 PT Time Calculation (min) (ACUTE ONLY): 33 min  Charges:    $Gait Training: 8-22 mins $Therapeutic Activity: 8-22 mins PT General Charges $$ ACUTE PT VISIT: 1 Visit                     Bernardino JINNY Ruth, PT, DPT Acute Rehabilitation Office [phone]    Bernardino JINNY Ruth 04/15/2024, 3:23 PM

## 2024-04-15 NOTE — Plan of Care (Signed)

## 2024-04-15 NOTE — Progress Notes (Signed)
  Subjective: Roger Pace is a 53 y.o. male s/p bilateral TKA.  They are POD1.  Pt's pain is moderate but overall controlled.  Pt denies any complain of chest pain, shortness of breath, abdominal pain, calf pain.  Patient denies any fevers or chills. Ambulated 30 ft with PT yesterday.  Overall he is doing well with no complaints.  No dizziness with ambulation  Objective: Vital signs in last 24 hours: Temp:  [97.5 F (36.4 C)-100.5 F (38.1 C)] 98 F (36.7 C) (08/27 0507) Pulse Rate:  [88-118] 95 (08/27 0507) Resp:  [16-26] 18 (08/27 0507) BP: (127-185)/(60-98) 152/62 (08/27 0507) SpO2:  [94 %-100 %] 98 % (08/27 0507)  Intake/Output from previous day: 08/26 0701 - 08/27 0700 In: 2295.6 [P.O.:240; I.V.:1400; IV Piggyback:655.6] Out: 1525 [Urine:1400; Blood:125] Intake/Output this shift: No intake/output data recorded.  Exam:  Right leg No gross blood or drainage overlying the dressing 2+ DP pulse Sensation intact distally in the operative foot Able to dorsiflex and plantarflex the operative foot No calf tenderness.  Negative Homans' sign. Able to perform straight leg raise  Left leg No gross blood or drainage overlying the dressing 2+ DP pulse Sensation intact distally in the operative foot Able to dorsiflex and plantarflex the operative foot No calf tenderness.  Negative Homans' sign. Able to perform straight leg raise   Labs: No results for input(s): HGB in the last 72 hours. No results for input(s): WBC, RBC, HCT, PLT in the last 72 hours. No results for input(s): NA, K, CL, CO2, BUN, CREATININE, GLUCOSE, CALCIUM in the last 72 hours. No results for input(s): LABPT, INR in the last 72 hours.  Assessment/Plan: Pt is POD1 s/p bilateral TKA.    -Plan to discharge to home in coming days pending patient's pain and PT eval  -Bilateral ultrasound of lower extremities ordered for Friday prior to discharge to rule out DVT prior to him  leaving the hospital.  -WBAT with a walker  -Follow-up with Dr. Addie in clinic 2 weeks postoperatively    [facility] 04/15/2024, 7:58 AM

## 2024-04-15 NOTE — Progress Notes (Signed)
 Physical Therapy Treatment Patient Details Name: Roger Pace MRN: [id number] DOB: 1971/02/12 Today's Date: 04/15/2024   History of Present Illness 53 y.o. male presents to [facility] hospital on 04/14/2024 for elective bilateral TKA. PMH includes umbilical hernia repair, OA.    PT Comments  Pt tolerates treatment well, ambulating for increased distances at this time. Pt continues to require physical assistance for transfers due to lack of LE power and ROM. Pt is encouraged to perform LE HEP for knee extension and ankle ROM. PT also reinforces the need for passive knee extension when resting. PT will continue to follow.    If plan is discharge home, recommend the following: A little help with walking and/or transfers;A little help with bathing/dressing/bathroom;Assistance with cooking/housework;Assist for transportation;Help with stairs or ramp for entrance   Can travel by private vehicle        Equipment Recommendations  Rolling walker (2 wheels);BSC/3in1    Recommendations for Other Services       Precautions / Restrictions Precautions Precautions: Fall;Knee Precaution Booklet Issued: Yes (comment) Recall of Precautions/Restrictions: Intact Required Braces or Orthoses: Knee Immobilizer - Left Restrictions Weight Bearing Restrictions Per Provider Order: Yes RLE Weight Bearing Per Provider Order: Weight bearing as tolerated LLE Weight Bearing Per Provider Order: Weight bearing as tolerated     Mobility  Bed Mobility Overal bed mobility: Needs Assistance Bed Mobility: Supine to Sit     Supine to sit: Supervision, HOB elevated, Used rails          Transfers Overall transfer level: Needs assistance Equipment used: Rolling walker (2 wheels) Transfers: Sit to/from Stand Sit to Stand: Min assist, From elevated surface                Ambulation/Gait Ambulation/Gait assistance: Contact guard assist Gait Distance (Feet): 150 Feet Assistive device: Rolling walker (2  wheels) Gait Pattern/deviations: Step-through pattern Gait velocity: reduced Gait velocity interpretation: <1.8 ft/sec, indicate of risk for recurrent falls   General Gait Details: slowed step-through gait, 2 brief standing breaks due to UE fatigue   Stairs             Wheelchair Mobility     Tilt Bed    Modified Rankin (Stroke Patients Only)       Balance Overall balance assessment: Needs assistance Sitting-balance support: No upper extremity supported, Feet supported Sitting balance-Leahy Scale: Good     Standing balance support: Bilateral upper extremity supported, Reliant on assistive device for balance, Single extremity supported Standing balance-Leahy Scale: Poor                              Communication Communication Communication: No apparent difficulties  Cognition Arousal: Alert Behavior During Therapy: WFL for tasks assessed/performed   PT - Cognitive impairments: No apparent impairments                         Following commands: Intact      Cueing Cueing Techniques: Verbal cues  Exercises Total Joint Exercises Goniometric ROM: [name] flexion 65, extension -6. L knee flexion 57, extension -4 Other Exercises Other Exercises: PT encourages ankle pumps and knee presses while up in the recliner this morning    General Comments General comments (skin integrity, edema, etc.): VSS on RA      Pertinent Vitals/Pain Pain Assessment Pain Assessment: 0-10 Pain Score: 4  Pain Location: knees Pain Descriptors / Indicators: Sore Pain Intervention(s): Monitored during  session    Home Living                          Prior Function            PT Goals (current goals can now be found in the care plan section) Acute Rehab PT Goals Patient Stated Goal: to return to independence Progress towards PT goals: Progressing toward goals    Frequency    7X/week      PT Plan      Co-evaluation               AM-PAC PT 6 Clicks Mobility   Outcome Measure  Help needed turning from your back to your side while in a flat bed without using bedrails?: A Little Help needed moving from lying on your back to sitting on the side of a flat bed without using bedrails?: A Little Help needed moving to and from a bed to a chair (including a wheelchair)?: A Little Help needed standing up from a chair using your arms (e.g., wheelchair or bedside chair)?: A Little Help needed to walk in hospital room?: A Little Help needed climbing 3-5 steps with a railing? : A Lot 6 Click Score: 17    End of Session Equipment Utilized During Treatment: Gait belt Activity Tolerance: Patient tolerated treatment well Patient left: in chair;with call bell/phone within reach;with family/visitor present Nurse Communication: Mobility status PT Visit Diagnosis: Other abnormalities of gait and mobility (R26.89);Muscle weakness (generalized) (M62.81);Pain Pain - Right/Left: Right Pain - part of body: Knee     Time: 9159-9084 PT Time Calculation (min) (ACUTE ONLY): 35 min  Charges:    $Gait Training: 8-22 mins $Therapeutic Activity: 8-22 mins PT General Charges $$ ACUTE PT VISIT: 1 Visit                     Bernardino JINNY Ruth, PT, DPT Acute Rehabilitation Office [phone]    Bernardino JINNY Ruth 04/15/2024, 9:24 AM

## 2024-04-15 NOTE — Progress Notes (Signed)
 Pt doing well Mobilizing Anticipate dc friday

## 2024-04-16 ENCOUNTER — Inpatient Hospital Stay (HOSPITAL_COMMUNITY)

## 2024-04-16 ENCOUNTER — Telehealth (HOSPITAL_COMMUNITY): Payer: Self-pay

## 2024-04-16 ENCOUNTER — Other Ambulatory Visit (HOSPITAL_COMMUNITY): Payer: Self-pay

## 2024-04-16 DIAGNOSIS — Z96653 Presence of artificial knee joint, bilateral: Secondary | ICD-10-CM | POA: Diagnosis not present

## 2024-04-16 DIAGNOSIS — I82459 Acute embolism and thrombosis of unspecified peroneal vein: Secondary | ICD-10-CM

## 2024-04-16 DIAGNOSIS — E66812 Obesity, class 2: Secondary | ICD-10-CM

## 2024-04-16 DIAGNOSIS — R509 Fever, unspecified: Secondary | ICD-10-CM | POA: Diagnosis not present

## 2024-04-16 DIAGNOSIS — R000 Tachycardia, unspecified: Secondary | ICD-10-CM

## 2024-04-16 DIAGNOSIS — M7989 Other specified soft tissue disorders: Secondary | ICD-10-CM | POA: Diagnosis not present

## 2024-04-16 DIAGNOSIS — Z6838 Body mass index (BMI) 38.0-38.9, adult: Secondary | ICD-10-CM

## 2024-04-16 DIAGNOSIS — I82409 Acute embolism and thrombosis of unspecified deep veins of unspecified lower extremity: Secondary | ICD-10-CM | POA: Insufficient documentation

## 2024-04-16 LAB — URINALYSIS, ROUTINE W REFLEX MICROSCOPIC
Bacteria, UA: NONE SEEN
Bilirubin Urine: NEGATIVE
Glucose, UA: 50 mg/dL — AB
Ketones, ur: NEGATIVE mg/dL
Leukocytes,Ua: NEGATIVE
Nitrite: NEGATIVE
Protein, ur: NEGATIVE mg/dL
Specific Gravity, Urine: 1.012 (ref 1.005–1.030)
pH: 6 (ref 5.0–8.0)

## 2024-04-16 LAB — CBC
HCT: 35.1 % — ABNORMAL LOW (ref 39.0–52.0)
Hemoglobin: 11.9 g/dL — ABNORMAL LOW (ref 13.0–17.0)
MCH: 31.2 pg (ref 26.0–34.0)
MCHC: 33.9 g/dL (ref 30.0–36.0)
MCV: 92.1 fL (ref 80.0–100.0)
Platelets: 232 K/uL (ref 150–400)
RBC: 3.81 MIL/uL — ABNORMAL LOW (ref 4.22–5.81)
RDW: 13.3 % (ref 11.5–15.5)
WBC: 14.3 K/uL — ABNORMAL HIGH (ref 4.0–10.5)
nRBC: 0 % (ref 0.0–0.2)

## 2024-04-16 LAB — BASIC METABOLIC PANEL WITH GFR
Anion gap: 11 (ref 5–15)
BUN: 13 mg/dL (ref 6–20)
CO2: 23 mmol/L (ref 22–32)
Calcium: 8.9 mg/dL (ref 8.9–10.3)
Chloride: 99 mmol/L (ref 98–111)
Creatinine, Ser: 1.09 mg/dL (ref 0.61–1.24)
GFR, Estimated: >60 mL/min (ref >60)
Glucose, Bld: 147 mg/dL — ABNORMAL HIGH (ref 70–99)
Potassium: 3.7 mmol/L (ref 3.5–5.1)
Sodium: 133 mmol/L — ABNORMAL LOW (ref 135–145)

## 2024-04-16 MED ORDER — RIVAROXABAN 15 MG PO TABS
15.0000 mg | ORAL_TABLET | Freq: Two times a day (BID) | ORAL | Status: DC
  Administered 2024-04-16 – 2024-04-22 (×11): 15 mg via ORAL
  Filled 2024-04-16 (×13): qty 1

## 2024-04-16 MED ORDER — RIVAROXABAN 20 MG PO TABS: 20.0000 mg | ORAL_TABLET | Freq: Every day | ORAL | Status: DC

## 2024-04-16 NOTE — Progress Notes (Signed)
 BLE venous exam is completed.  Positive results were secure chat messaged to nurse.  Daphene Chisholm, RVT

## 2024-04-16 NOTE — Progress Notes (Signed)
  Subjective: Patient stable.  Pain controlled.  Did get up earlier today.   Objective: Vital signs in last 24 hours: Temp:  [98.6 F (37 C)-100.2 F (37.9 C)] 98.6 F (37 C) (08/28 1350) Pulse Rate:  [104-113] 104 (08/28 0500) Resp:  [16-17] 17 (08/28 1350) BP: (104-183)/(72-93) 137/93 (08/28 1350) SpO2:  [97 %-100 %] 100 % (08/28 1350)  Intake/Output from previous day: 08/27 0701 - 08/28 0700 In: 480 [P.O.:480] Out: 900 [Urine:900] Intake/Output this shift: No intake/output data recorded.  Exam:  Intact pulses distally Dorsiflexion/Plantar flexion intact  Labs: Recent Labs    04/16/24 1347  HGB 11.9*   Recent Labs    04/16/24 1347  WBC 14.3*  RBC 3.81*  HCT 35.1*  PLT 232   Recent Labs    04/16/24 1347  NA 133*  K 3.7  CL 99  CO2 23  BUN 13  CREATININE 1.09  GLUCOSE 147*  CALCIUM 8.9   No results for input(s): LABPT, INR in the last 72 hours.  Assessment/Plan: Plan at this time is to continue Xarelto .  DVT positive.ultrasoundgh today for calf DVT bilaterally.  Appreciate medical input.  Okay for physical therapy tomorrow.  On Xarelto .  Continue with progression towards discharge to home.  May try stairs tomorrow.  Patient has been having episodic fevers which is not unexpected 48 hours after surgery.  White count 14,000 and urinalysis negative.  Continue to follow.   KANDICE Hamilton Bartlomiej Jenkinson 04/16/2024, 7:18 PM

## 2024-04-16 NOTE — Progress Notes (Signed)
 PT Cancellation Note  Patient Details Name: Roger Pace MRN: [id number] DOB: 1970/09/01   Cancelled Treatment:    Reason Eval/Treat Not Completed: (P) Medical issues which prohibited therapy, pt with new bil LE DVTs, not previously on anticoagulation. Per department protocol, holding ambulation until 3 hours after initial does of Xarelto  given. Pt declining bed level therex as he just applied CPM. Will check back as schedule allows to continue with PT POC.   Therisa SAUNDERS. PTA Acute Rehabilitation Services Office: [phone]    Therisa CHRISTELLA Boor 04/16/2024, 2:18 PM

## 2024-04-16 NOTE — Telephone Encounter (Signed)
 Pharmacy Patient Advocate Encounter  Insurance verification completed.    The patient is insured through [facility].     Ran test claim for Eliquis Starter Pack and the current 30 day co-pay is $0.00.  Ran test claim for Xarelto  Starter Pack and the current 30 day co-pay is $0.00.  This test claim was processed through [facility]- copay amounts may vary at other pharmacies due to pharmacy/plan contracts, or as the patient moves through the different stages of their insurance plan.

## 2024-04-16 NOTE — Discharge Instructions (Signed)
 Information on my medicine - XARELTO  (rivaroxaban )  This medication education was reviewed with me or my healthcare representative as part of my discharge preparation.  WHY WAS XARELTO  PRESCRIBED FOR YOU? Xarelto  was prescribed to treat blood clots that may have been found in the veins of your legs (deep vein thrombosis) or in your lungs (pulmonary embolism) and to reduce the risk of them occurring again.  What do you need to know about Xarelto ? The starting dose is one 15 mg tablet taken TWICE daily with food for the FIRST 21 DAYS then on 05/07/2024  the dose is changed to one 20 mg tablet taken ONCE A DAY with your evening meal.  DO NOT stop taking Xarelto  without talking to the health care provider who prescribed the medication.  Refill your prescription for 20 mg tablets before you run out.  After discharge, you should have regular check-up appointments with your healthcare provider that is prescribing your Xarelto .  In the future your dose may need to be changed if your kidney function changes by a significant amount.  What do you do if you miss a dose? If you are taking Xarelto  TWICE DAILY and you miss a dose, take it as soon as you remember. You may take two 15 mg tablets (total 30 mg) at the same time then resume your regularly scheduled 15 mg twice daily the next day.  If you are taking Xarelto  ONCE DAILY and you miss a dose, take it as soon as you remember on the same day then continue your regularly scheduled once daily regimen the next day. Do not take two doses of Xarelto  at the same time.   Important Safety Information Xarelto  is a blood thinner medicine that can cause bleeding. You should call your healthcare provider right away if you experience any of the following: Bleeding from an injury or your nose that does not stop. Unusual colored urine (red or dark brown) or unusual colored stools (red or black). Unusual bruising for unknown reasons. A serious fall or if you  hit your head (even if there is no bleeding).  Some medicines may interact with Xarelto  and might increase your risk of bleeding while on Xarelto . To help avoid this, consult your healthcare provider or pharmacist prior to using any new prescription or non-prescription medications, including herbals, vitamins, non-steroidal anti-inflammatory drugs (NSAIDs) and supplements.  This website has more information on Xarelto : [email or web address] .com.

## 2024-04-16 NOTE — Evaluation (Signed)
 Occupational Therapy Evaluation Patient Details Name: Roger Pace MRN: [id number] DOB: May 11, 1971 Today's Date: 04/16/2024   History of Present Illness   53 y.o. male presents to [facility] hospital on 04/14/2024 for elective bilateral TKA. PMH includes umbilical hernia repair, OA.     Clinical Impressions Initial assessment for patient with above procedure.  Hip kit reviewed for increased independence for ADL completion.  Patient still needing quite a bit of assist for basic mobility, and close to Mod A for ADL completion, but should progress quickly.  No further OT needs in the acute setting.  Follow up as prescribed by MD.       If plan is discharge home, recommend the following:   Assist for transportation;Assistance with cooking/housework;A little help with walking and/or transfers;A lot of help with bathing/dressing/bathroom     Functional Status Assessment   Patient has not had a recent decline in their functional status     Equipment Recommendations   Tub/shower seat     Recommendations for Other Services         Precautions/Restrictions   Precautions Precautions: Fall;Knee Precaution Booklet Issued: Yes (comment) Recall of Precautions/Restrictions: Intact Required Braces or Orthoses: Knee Immobilizer - Right Restrictions Weight Bearing Restrictions Per Provider Order: Yes RLE Weight Bearing Per Provider Order: Weight bearing as tolerated LLE Weight Bearing Per Provider Order: Weight bearing as tolerated     Mobility Bed Mobility Overal bed mobility: Needs Assistance Bed Mobility: Supine to Sit, Sit to Supine     Supine to sit: Min assist Sit to supine: Min assist        Transfers Overall transfer level: Needs assistance Equipment used: Rolling walker (2 wheels) Transfers: Sit to/from Stand, Bed to chair/wheelchair/BSC Sit to Stand: Mod assist     Step pivot transfers: Supervision            Balance Overall balance assessment: Needs  assistance   Sitting balance-Leahy Scale: Good     Standing balance support: Reliant on assistive device for balance Standing balance-Leahy Scale: Fair                             ADL either performed or assessed with clinical judgement   ADL                       Lower Body Dressing: Moderate assistance;Sit to/from stand   Toilet Transfer: Supervision/safety;Ambulation;BSC/3in1                   Vision Patient Visual Report: No change from baseline       Perception Perception: Not tested       Praxis Praxis: Not tested       Pertinent Vitals/Pain Pain Assessment Pain Assessment: Faces Faces Pain Scale: Hurts even more Pain Location: knees Pain Descriptors / Indicators: Grimacing, Guarding Pain Intervention(s): Monitored during session, Patient requesting pain meds-RN notified     Extremity/Trunk Assessment Upper Extremity Assessment Upper Extremity Assessment: Overall WFL for tasks assessed   Lower Extremity Assessment Lower Extremity Assessment: Defer to PT evaluation   Cervical / Trunk Assessment Cervical / Trunk Assessment: Normal   Communication Communication Communication: No apparent difficulties   Cognition Arousal: Alert Behavior During Therapy: WFL for tasks assessed/performed Cognition: No apparent impairments  Following commands: Intact       Cueing  General Comments   Cueing Techniques: Verbal cues   VSS on RA   Exercises     Shoulder Instructions      Home Living Family/patient expects to be discharged to:: Private residence Living Arrangements: Spouse/significant other Available Help at Discharge: Family Type of Home: Apartment Home Access: Elevator;Level entry     Home Layout: One level     Bathroom Shower/Tub: Producer, television/film/video: Standard     Home Equipment: None          Prior Functioning/Environment Prior Level of Function  : Independent/Modified Independent;Working/employed;Driving             Mobility Comments: works for [employer] ADLs Comments: Ind with ADL,iADL    OT Problem List: Decreased range of motion   OT Treatment/Interventions:        OT Goals(Current goals can be found in the care plan section)   Acute Rehab OT Goals Patient Stated Goal: Return home OT Goal Formulation: With patient Time For Goal Achievement: 04/17/24 Potential to Achieve Goals: Good   OT Frequency:       Co-evaluation              AM-PAC OT 6 Clicks Daily Activity     Outcome Measure Help from another person eating meals?: None Help from another person taking care of personal grooming?: None Help from another person toileting, which includes using toliet, bedpan, or urinal?: A Little Help from another person bathing (including washing, rinsing, drying)?: A Lot Help from another person to put on and taking off regular upper body clothing?: None Help from another person to put on and taking off regular lower body clothing?: A Lot 6 Click Score: 19   End of Session Equipment Utilized During Treatment: Rolling walker (2 wheels)  Activity Tolerance: Patient tolerated treatment well Patient left: in bed;with call bell/phone within reach                   Time: 0840-0902 OT Time Calculation (min): 22 min Charges:  OT General Charges $OT Visit: 1 Visit OT Evaluation $OT Eval Moderate Complexity: 1 Mod  04/16/2024  RP, OTR/L  Acute Rehabilitation Services  Office:  [phone]   Charlie JONETTA Halsted 04/16/2024, 9:08 AM

## 2024-04-16 NOTE — Consult Note (Signed)
 History and Physical   Roger Pace FMW:[id number] DOB: Nov 12, 1970 DOA: 04/14/2024  PCP: Hughie Sharper, MD  Requesting Provider: Cordella Hutchinson, MD Reason for consult: DVT  HPI/Course: Roger Pace is a 53 y.o. male with medical history significant of obesity, arthritis who presented for bilateral TKA surgery. Now with complication of DVT.  Patient presented for bilateral TKA after failing nonsurgical treatment for his worsening knee pain and decreased function that worsened over 10 years secondary to arthritis.  Underwent successful surgical intervention.  Has had some low-grade fevers the last couple days and some intermittent tachycardia.  He has received as needed Tylenol  for his fever this month received 3 doses of Ancef  postoperatively.  Is a screening measure lower extremity DVT study was obtained and preliminary result is positive for bilateral DVT in the posterior tibial vein positive on and peroneal vein positive right.  Most recent labs were on 8/21 and were normal.  Patient denies chills, chest pain, shortness of breath, abdominal pain, constipation, diarrhea, nausea, vomiting.  Review of Systems: As per HPI otherwise all other systems reviewed and are negative.  Past Medical History:  Diagnosis Date   Arthritis     Past Surgical History:  Procedure Laterality Date   HARDWARE REMOVAL Right 04/14/2024   Procedure: REMOVAL, HARDWARE;  Surgeon: Hutchinson Cordella Hamilton, MD;  Location: [facility] OR;  Service: Orthopedics;  Laterality: Right;   KNEE SURGERY Bilateral    arthroscopy on each knee, and reconstruction on right knee   LAPAROSCOPY N/A 09/18/2023   Procedure: DIAGNOSTIC LAPAROSCOPY;  Surgeon: Ebbie Cough, MD;  Location: [facility] OR;  Service: General;  Laterality: N/A;   SHOULDER SURGERY Left    repaired humerus (shoulder kept dislocating)   TOTAL KNEE ARTHROPLASTY Bilateral 04/14/2024   Procedure: ARTHROPLASTY, KNEE, BILATERAL, TOTAL;  Surgeon: Hutchinson Cordella Hamilton,  MD;  Location: [facility] OR;  Service: Orthopedics;  Laterality: Bilateral;   UMBILICAL HERNIA REPAIR N/A 09/18/2023   Procedure: UMBILICAL HERNIA REPAIR;  Surgeon: Ebbie Cough, MD;  Location: [facility] OR;  Service: General;  Laterality: N/A;  RNFA GEN & TAP BLOCK    Social History  reports that he has never smoked. He has never used smokeless tobacco. He reports that he does not drink alcohol and does not use drugs.  Allergies  Allergen Reactions   Shellfish Allergy Hives    Family History  Problem Relation Age of Onset   Hypertension Mother    Irregular heart beat Mother    Osteoarthritis Mother        Knees   Diabetes Father    Hypertension Father    Other Father        Elevated LFT   Prostate cancer Father    Cancer Father    Leukemia Sister    Cancer Sister    Arrhythmia Brother    Hyperlipidemia Brother    Hypertension Brother    Prostate cancer Maternal Uncle    Cancer Maternal Uncle    Prostate cancer Paternal Uncle    Cancer Paternal Uncle   Reviewed on admission  Prior to Admission medications   Medication Sig Start Date End Date Taking? Authorizing Provider  Ascorbic Acid (VITAMIN C PO) Take 1 tablet by mouth at bedtime.   Yes [provider]  b complex vitamins capsule Take 1 capsule by mouth at bedtime.   Yes [provider]  celecoxib  (CELEBREX ) 200 MG capsule Take 1 capsule (200 mg total) by mouth 2 (two) times daily as needed. Patient taking differently: Take  400 mg by mouth in the morning and at bedtime. 12/19/20  Yes Hilts, Ozell, MD  Cholecalciferol (VITAMIN D -3 PO) Take 1 tablet by mouth at bedtime.   Yes [provider]  Collagen-Vitamin C-Biotin (COLLAGEN PO) Take 1 capsule by mouth at bedtime.   Yes [provider]  CREATINE PO Take 1 Scoop by mouth at bedtime.   Yes [provider]  MAGNESIUM PO Take 1 capsule by mouth at bedtime.   Yes [provider]  POTASSIUM PO Take 1 capsule by mouth at  bedtime.   Yes [provider]    Physical Exam: Vitals:   04/15/24 2114 04/15/24 2247 04/16/24 0500 04/16/24 0806  BP: (!) 183/82 (!) 158/75 (!) 151/72 104/76  Pulse: (!) 113 (!) 109 (!) 104   Resp: 17 17 16 17   Temp: 100 F (37.8 C) 100.2 F (37.9 C) 99.2 F (37.3 C) 100 F (37.8 C)  TempSrc: Oral  Oral Oral  SpO2: 100% 97% 97% 98%  Weight:      Height:        Physical Exam Constitutional:      General: He is not in acute distress.    Appearance: Normal appearance.  HENT:     Head: Normocephalic and atraumatic.     Mouth/Throat:     Mouth: Mucous membranes are moist.     Pharynx: Oropharynx is clear.  Eyes:     Extraocular Movements: Extraocular movements intact.     Pupils: Pupils are equal, round, and reactive to light.  Cardiovascular:     Rate and Rhythm: Tachycardia and regular rhythm.     Pulses: Normal pulses.     Heart sounds: Normal heart sounds.  Pulmonary:     Effort: Pulmonary effort is normal. No respiratory distress.     Breath sounds: Normal breath sounds.  Abdominal:     General: Bowel sounds are normal. There is no distension.     Palpations: Abdomen is soft.     Tenderness: There is no abdominal tenderness.  Musculoskeletal:        General: No swelling or deformity.  Skin:    General: Skin is warm and dry.  Neurological:     General: No focal deficit present.     Mental Status: Mental status is at baseline.    Labs on Admission: I have personally reviewed following labs and imaging studies  CBC: No results for input(s): WBC, NEUTROABS, HGB, HCT, MCV, PLT in the last 168 hours.  Basic Metabolic Panel: No results for input(s): NA, K, CL, CO2, GLUCOSE, BUN, CREATININE, CALCIUM, MG, PHOS in the last 168 hours.  GFR: Estimated Creatinine Clearance: 108.2 mL/min (by C-G formula based on SCr of 1.04 mg/dL).  Liver Function Tests: No results for input(s): AST, ALT, ALKPHOS, BILITOT, PROT,  ALBUMIN in the last 168 hours.  Urine analysis:    Component Value Date/Time   COLORURINE YELLOW 04/09/2024 0922   APPEARANCEUR CLEAR 04/09/2024 0922   LABSPEC 1.024 04/09/2024 0922   PHURINE 5.0 04/09/2024 0922   GLUCOSEU NEGATIVE 04/09/2024 0922   HGBUR NEGATIVE 04/09/2024 0922   BILIRUBINUR NEGATIVE 04/09/2024 0922   KETONESUR NEGATIVE 04/09/2024 0922   PROTEINUR NEGATIVE 04/09/2024 0922   NITRITE NEGATIVE 04/09/2024 0922   LEUKOCYTESUR NEGATIVE 04/09/2024 0922    Radiological Exams on Admission: VAS US  LOWER EXTREMITY VENOUS (DVT) Result Date: 04/16/2024  Lower Venous DVT Study Patient Name:  EWART CARRERA  Date of Exam:   04/16/2024 Medical Rec #: [id number]  Accession #:    [id number] Date of Birth: [birth year]          Patient Gender: M Patient Age:   89 years Exam Location:  [facility] Procedure:      VAS US  LOWER EXTREMITY VENOUS (DVT) Referring Phys: CARLIN CALIX --------------------------------------------------------------------------------  Indications: Swelling, and Edema.  Risk Factors: Surgery Bilat knee surgery 04/14/2024. Performing Technologist: Elmarie Lindau, RVT  Examination Guidelines: A complete evaluation includes B-mode imaging, spectral Doppler, color Doppler, and power Doppler as needed of all accessible portions of each vessel. Bilateral testing is considered an integral part of a complete examination. Limited examinations for reoccurring indications may be performed as noted. The reflux portion of the exam is performed with the patient in reverse Trendelenburg.  +---------+---------------+---------+-----------+----------+--------------+ RIGHT    CompressibilityPhasicitySpontaneityPropertiesThrombus Aging +---------+---------------+---------+-----------+----------+--------------+ CFV      Full           Yes      Yes                                 +---------+---------------+---------+-----------+----------+--------------+ SFJ       Full                                                        +---------+---------------+---------+-----------+----------+--------------+ FV Prox  Full                                                        +---------+---------------+---------+-----------+----------+--------------+ FV Mid   Full                                                        +---------+---------------+---------+-----------+----------+--------------+ FV DistalFull                                                        +---------+---------------+---------+-----------+----------+--------------+ PFV      Full                                                        +---------+---------------+---------+-----------+----------+--------------+ POP      Full           Yes      Yes                                 +---------+---------------+---------+-----------+----------+--------------+ PTV      Full                                                        +---------+---------------+---------+-----------+----------+--------------+  PERO     None                                         Acute          +---------+---------------+---------+-----------+----------+--------------+   +---------+---------------+---------+-----------+----------+--------------+ LEFT     CompressibilityPhasicitySpontaneityPropertiesThrombus Aging +---------+---------------+---------+-----------+----------+--------------+ CFV      Full           Yes      Yes                                 +---------+---------------+---------+-----------+----------+--------------+ SFJ      Full                                                        +---------+---------------+---------+-----------+----------+--------------+ FV Prox  Full                                                        +---------+---------------+---------+-----------+----------+--------------+ FV Mid   Full                                                         +---------+---------------+---------+-----------+----------+--------------+ FV DistalFull                                                        +---------+---------------+---------+-----------+----------+--------------+ PFV      Full                                                        +---------+---------------+---------+-----------+----------+--------------+ POP      Full           Yes      Yes                                 +---------+---------------+---------+-----------+----------+--------------+ PTV      None                                         Acute          +---------+---------------+---------+-----------+----------+--------------+ PERO     Full                                                        +---------+---------------+---------+-----------+----------+--------------+     *  See table(s) above for measurements and observations.    Preliminary    EKG: Not recently obtained.  Assessment/Plan Principal Problem:   S/P TKR (total knee replacement), bilateral Bilateral DVT Fever Tachycardia Obesity  DVT > Positive blood preliminary DVT study bilaterally peroneal vein on right and posterior tibial vein on left. > Has been ambulating well.  Despite this now has bilateral DVT.  Veins are more distal but close to proximal veins with risk of extension.  Will start anticoagulation for at least 3 months, will need continued outpatient follow-up with PCP. > Saturating well on room air, no shortness of breath.  Intermittent tachycardia though this appears to be related to fevers, lower suspicion for PE at this time, if increased suspicion could pursue additional imaging versus extending duration of anticoagulation to 6 months empirically. - Start Xarelto    Fevers > From early low-grade fever 99-100. Did spike 1 true fever yesterday at 100.5. > Did receive 3 doses of Ancef  postoperatively, last dose was yesterday at 1241. > Given ongoing  low-grade fevers we will check basic labs.  No complaints of respiratory nor urinary symptoms. > Pending initial results may benefit from chest x-ray or urinalysis. - CBC - Trend fever curve WBC   Intermittent tachycardia > Possibly related to fevers.  Will continue to monitor given positive DVT as above. - Continue to monitor - Check basic labs  Status post bilateral TKR > Tolerated surgery well. Has had some fevers and tachycardia post op.  Now with positive preliminary DVT study as above. - Continue with postoperative management per primary team  Obesity - Noted  [facility] will continue to follow, we appreciate this consultation.  Marsa KATHEE Scurry MD [facility]  How to contact the [facility] Attending or Consulting provider 7A - 7P or covering provider during after hours 7P -7A, for this patient?   Check the care team in [facility] and look for a) attending/consulting [facility] provider listed and b) the [facility] team listed Log into [email or web address] and use [place]'s universal password to access. If you do not have the password, please contact the hospital operator. Locate the [facility] provider you are looking for under [facility] and page to a number that you can be directly reached. If you still have difficulty reaching the provider, please page the [facility] (Director on Call) for the Hospitalists listed on amion for assistance.  04/16/2024, 12:28 PM

## 2024-04-16 NOTE — Progress Notes (Signed)
 Physical Therapy Treatment Patient Details Name: Roger Pace MRN: [id number] DOB: Dec 20, 1970 Today's Date: 04/16/2024   History of Present Illness 53 y.o. male presents to [facility] hospital on 04/14/2024 for elective bilateral TKA. PMH includes umbilical hernia repair, OA.    PT Comments  Pt resting in bed on arrival, agreeable to session and demonstrating slow but steady progress towards acute goals as pt continues to be limited in safe mobility by BLE pain and weakness. Pt able to come to sitting EOB with min A to manage Les. Pt needing cues for LE placement and use of momentum to boost to stand with mod assist provided and increased time needed to complete. Pt progressing gait distance slightly with RW for support and CGA, however continues to have heavy reliance on UE support on RW and x1 instance of L knee buckling. L KI donned throughout. Educated and encouraged pt to perform HEP between therapies and plan to continue to progress functional mobility as able in PM session. Pt continues to benefit from skilled PT services to progress toward functional mobility goals.     If plan is discharge home, recommend the following: A little help with walking and/or transfers;A little help with bathing/dressing/bathroom;Assistance with cooking/housework;Assist for transportation;Help with stairs or ramp for entrance   Can travel by private vehicle        Equipment Recommendations  Rolling walker (2 wheels);BSC/3in1    Recommendations for Other Services       Precautions / Restrictions Precautions Precautions: Fall;Knee Precaution Booklet Issued: Yes (comment) Recall of Precautions/Restrictions: Intact Required Braces or Orthoses: Knee Immobilizer - Left Restrictions Weight Bearing Restrictions Per Provider Order: Yes RLE Weight Bearing Per Provider Order: Weight bearing as tolerated LLE Weight Bearing Per Provider Order: Weight bearing as tolerated     Mobility  Bed Mobility Overal bed  mobility: Needs Assistance Bed Mobility: Supine to Sit, Sit to Supine     Supine to sit: Min assist Sit to supine: Min assist   General bed mobility comments: min A to manate BLEs to and off EOB and back into bed at end of session    Transfers Overall transfer level: Needs assistance Equipment used: Rolling walker (2 wheels) Transfers: Sit to/from Stand, Bed to chair/wheelchair/BSC Sit to Stand: Min assist, From elevated surface           General transfer comment: EOB eleavted to home height, cues to place feet under knees and for use of momentum to power up, mod A to complete with increased time to come to upright standing    Ambulation/Gait Ambulation/Gait assistance: Contact guard assist Gait Distance (Feet): 160 Feet Assistive device: Rolling walker (2 wheels) Gait Pattern/deviations: Step-through pattern Gait velocity: reduced     General Gait Details: slowed step-through gait, multiple standing breaks due to UE fatigue. One instance of mild buckling of L knee.   Stairs             Wheelchair Mobility     Tilt Bed    Modified Rankin (Stroke Patients Only)       Balance Overall balance assessment: Needs assistance Sitting-balance support: No upper extremity supported, Feet supported Sitting balance-Leahy Scale: Good     Standing balance support: Reliant on assistive device for balance Standing balance-Leahy Scale: Fair                              Musician Communication: No apparent difficulties  Cognition Arousal: Alert Behavior  During Therapy: WFL for tasks assessed/performed   PT - Cognitive impairments: No apparent impairments                         Following commands: Intact      Cueing Cueing Techniques: Verbal cues  Exercises Total Joint Exercises Goniometric ROM: [name] flexion 68, extension -7. L knee flexion 72, extension -5 Other Exercises Other Exercises: PTA encourages performance  of HEP exercises in supine between therapies    General Comments General comments (skin integrity, edema, etc.): VSS on RA, pt spouse present and supportive      Pertinent Vitals/Pain Pain Assessment Pain Assessment: Faces Faces Pain Scale: Hurts even more Pain Location: knees Pain Descriptors / Indicators: Grimacing, Guarding Pain Intervention(s): Premedicated before session, Monitored during session, Limited activity within patient's tolerance    Home Living Family/patient expects to be discharged to:: Private residence Living Arrangements: Spouse/significant other Available Help at Discharge: Family Type of Home: Apartment Home Access: Elevator;Level entry       Home Layout: One level Home Equipment: None      Prior Function            PT Goals (current goals can now be found in the care plan section) Acute Rehab PT Goals Patient Stated Goal: to return to independence PT Goal Formulation: With patient Time For Goal Achievement: 04/18/24 Progress towards PT goals: Progressing toward goals    Frequency    7X/week      PT Plan      Co-evaluation              AM-PAC PT 6 Clicks Mobility   Outcome Measure  Help needed turning from your back to your side while in a flat bed without using bedrails?: A Little Help needed moving from lying on your back to sitting on the side of a flat bed without using bedrails?: A Little Help needed moving to and from a bed to a chair (including a wheelchair)?: A Little Help needed standing up from a chair using your arms (e.g., wheelchair or bedside chair)?: A Lot Help needed to walk in hospital room?: A Little Help needed climbing 3-5 steps with a railing? : A Lot 6 Click Score: 16    End of Session   Activity Tolerance: Patient tolerated treatment well Patient left: in bed;with call bell/phone within reach;with family/visitor present Nurse Communication: Mobility status PT Visit Diagnosis: Other abnormalities of  gait and mobility (R26.89);Muscle weakness (generalized) (M62.81);Pain Pain - Right/Left: Right Pain - part of body: Knee     Time: 8995-8962 PT Time Calculation (min) (ACUTE ONLY): 33 min  Charges:    $Gait Training: 8-22 mins $Therapeutic Activity: 8-22 mins PT General Charges $$ ACUTE PT VISIT: 1 Visit                     Danalee Flath R. PTA Acute Rehabilitation Services Office: [phone]   Therisa CHRISTELLA Boor 04/16/2024, 11:01 AM

## 2024-04-17 ENCOUNTER — Telehealth: Payer: Self-pay

## 2024-04-17 ENCOUNTER — Other Ambulatory Visit (HOSPITAL_COMMUNITY): Payer: Self-pay

## 2024-04-17 DIAGNOSIS — Z96653 Presence of artificial knee joint, bilateral: Secondary | ICD-10-CM | POA: Diagnosis not present

## 2024-04-17 LAB — CBC WITH DIFFERENTIAL/PLATELET
Abs Immature Granulocytes: 0.06 K/uL (ref 0.00–0.07)
Basophils Absolute: 0 K/uL (ref 0.0–0.1)
Basophils Relative: 0 %
Eosinophils Absolute: 0 K/uL (ref 0.0–0.5)
Eosinophils Relative: 0 %
HCT: 33.7 % — ABNORMAL LOW (ref 39.0–52.0)
Hemoglobin: 11.5 g/dL — ABNORMAL LOW (ref 13.0–17.0)
Immature Granulocytes: 0 %
Lymphocytes Relative: 18 %
Lymphs Abs: 2.5 K/uL (ref 0.7–4.0)
MCH: 31.2 pg (ref 26.0–34.0)
MCHC: 34.1 g/dL (ref 30.0–36.0)
MCV: 91.3 fL (ref 80.0–100.0)
Monocytes Absolute: 1.5 K/uL — ABNORMAL HIGH (ref 0.1–1.0)
Monocytes Relative: 11 %
Neutro Abs: 10.3 K/uL — ABNORMAL HIGH (ref 1.7–7.7)
Neutrophils Relative %: 71 %
Platelets: 229 K/uL (ref 150–400)
RBC: 3.69 MIL/uL — ABNORMAL LOW (ref 4.22–5.81)
RDW: 13.1 % (ref 11.5–15.5)
WBC: 14.4 K/uL — ABNORMAL HIGH (ref 4.0–10.5)
nRBC: 0 % (ref 0.0–0.2)

## 2024-04-17 LAB — COMPREHENSIVE METABOLIC PANEL WITH GFR
ALT: 18 U/L (ref 0–44)
AST: 40 U/L (ref 15–41)
Albumin: 3 g/dL — ABNORMAL LOW (ref 3.5–5.0)
Alkaline Phosphatase: 33 U/L — ABNORMAL LOW (ref 38–126)
Anion gap: 15 (ref 5–15)
BUN: 11 mg/dL (ref 6–20)
CO2: 24 mmol/L (ref 22–32)
Calcium: 8.9 mg/dL (ref 8.9–10.3)
Chloride: 95 mmol/L — ABNORMAL LOW (ref 98–111)
Creatinine, Ser: 1.12 mg/dL (ref 0.61–1.24)
GFR, Estimated: >60 mL/min (ref >60)
Glucose, Bld: 138 mg/dL — ABNORMAL HIGH (ref 70–99)
Potassium: 3.5 mmol/L (ref 3.5–5.1)
Sodium: 134 mmol/L — ABNORMAL LOW (ref 135–145)
Total Bilirubin: 1.2 mg/dL (ref 0.0–1.2)
Total Protein: 7.1 g/dL (ref 6.5–8.1)

## 2024-04-17 MED ORDER — CELECOXIB 200 MG PO CAPS
200.0000 mg | ORAL_CAPSULE | Freq: Two times a day (BID) | ORAL | 6 refills | Status: DC | PRN
  Filled 2024-04-17: qty 60, 30d supply, fill #0

## 2024-04-17 MED ORDER — GABAPENTIN 100 MG PO CAPS
200.0000 mg | ORAL_CAPSULE | Freq: Three times a day (TID) | ORAL | 0 refills | Status: DC
  Filled 2024-04-17: qty 60, 10d supply, fill #0

## 2024-04-17 MED ORDER — METHOCARBAMOL 500 MG PO TABS
500.0000 mg | ORAL_TABLET | Freq: Four times a day (QID) | ORAL | 0 refills | Status: DC | PRN
  Filled 2024-04-17: qty 30, 8d supply, fill #0

## 2024-04-17 MED ORDER — RIVAROXABAN (XARELTO) VTE STARTER PACK (15 & 20 MG)
ORAL_TABLET | ORAL | 0 refills | Status: DC
  Filled 2024-04-17 – 2024-05-01 (×2): qty 51, 30d supply, fill #0

## 2024-04-17 MED ORDER — RIVAROXABAN 2.5 MG PO TABS
ORAL_TABLET | ORAL | 3 refills | Status: DC
  Filled 2024-04-17: qty 30, 3d supply, fill #0

## 2024-04-17 MED ORDER — DOCUSATE SODIUM 100 MG PO CAPS
100.0000 mg | ORAL_CAPSULE | Freq: Two times a day (BID) | ORAL | 0 refills | Status: DC
  Filled 2024-04-17: qty 10, 5d supply, fill #0

## 2024-04-17 MED ORDER — ACETAMINOPHEN 325 MG PO TABS
325.0000 mg | ORAL_TABLET | Freq: Four times a day (QID) | ORAL | 0 refills | Status: DC | PRN
  Filled 2024-04-17: qty 60, 8d supply, fill #0

## 2024-04-17 MED ORDER — OXYCODONE HCL 5 MG PO TABS
5.0000 mg | ORAL_TABLET | ORAL | 0 refills | Status: DC | PRN
  Filled 2024-04-17: qty 30, 5d supply, fill #0

## 2024-04-17 NOTE — Plan of Care (Signed)

## 2024-04-17 NOTE — Progress Notes (Signed)
 Physical Therapy Treatment Patient Details Name: Roger Pace MRN: [id number] DOB: 11/27/1970 Today's Date: 04/17/2024   History of Present Illness 53 y.o. male presents to [facility] hospital on 04/14/2024 for elective bilateral TKA. PMH includes umbilical hernia repair, OA.    PT Comments  Pt resting in bed on arrival, pleasant and eager for mobility. Session limited as pt with pre-syncopal event post transfer to chair. Pt with increased diaphoresis in standing and with max c/o nausea as pt stepping over to chair, once seated pt with moment of decreased responsiveness, resolving with supine positioning, BP supine in chair 132/72 with symptoms resolving in <30 seconds. Pt tolerating chair back elevated to upright with BP increasing to 152/84 at end of session. Educated pt on importance of upright sitting for BP compliance and time up OOB with pt up in chair at end of session with all needs met. RN aware of all vitals and pre-syncopal event. Plan to progress mobility as tolerated in PM session. Pt continues to benefit from skilled PT services to progress toward functional mobility goals.     If plan is discharge home, recommend the following: A little help with walking and/or transfers;A little help with bathing/dressing/bathroom;Assistance with cooking/housework;Assist for transportation;Help with stairs or ramp for entrance   Can travel by private vehicle        Equipment Recommendations  Rolling walker (2 wheels);BSC/3in1    Recommendations for Other Services       Precautions / Restrictions Precautions Precautions: Fall;Knee Precaution Booklet Issued: Yes (comment) Recall of Precautions/Restrictions: Intact Required Braces or Orthoses: Knee Immobilizer - Left Restrictions Weight Bearing Restrictions Per Provider Order: Yes RLE Weight Bearing Per Provider Order: Weight bearing as tolerated LLE Weight Bearing Per Provider Order: Weight bearing as tolerated     Mobility  Bed  Mobility Overal bed mobility: Needs Assistance Bed Mobility: Supine to Sit, Sit to Supine     Supine to sit: Min assist     General bed mobility comments: min A to manate BLEs to and off EOB    Transfers Overall transfer level: Needs assistance Equipment used: Rolling walker (2 wheels) Transfers: Sit to/from Stand, Bed to chair/wheelchair/BSC Sit to Stand: From elevated surface, Mod assist           General transfer comment: EOB eleavted to home height, cues to place feet under knees and for use of momentum to power up, mod A to complete with increased time to come to upright standing    Ambulation/Gait Ambulation/Gait assistance: Min assist Gait Distance (Feet): 2 Feet Assistive device: Rolling walker (2 wheels) Gait Pattern/deviations: Step-through pattern Gait velocity: decr     General Gait Details: pt stepping over to chair with slow controlled steps, pt with diaphresis and nausea and decreased responsiveness once seated, feet elevated and chair laid flat with resolution of symtpoms, BP 132/72   Stairs             Wheelchair Mobility     Tilt Bed    Modified Rankin (Stroke Patients Only)       Balance Overall balance assessment: Needs assistance Sitting-balance support: No upper extremity supported, Feet supported Sitting balance-Leahy Scale: Good     Standing balance support: Reliant on assistive device for balance Standing balance-Leahy Scale: Fair                              Musician Communication: No apparent difficulties  Cognition Arousal: Alert Behavior During  Therapy: WFL for tasks assessed/performed   PT - Cognitive impairments: No apparent impairments                         Following commands: Intact      Cueing Cueing Techniques: Verbal cues  Exercises      General Comments General comments (skin integrity, edema, etc.): Pt becoming increasingly diaphrotic with trasnfer from EOB  to chair and reporting max nausea, once seated in chair pt with moment of decreased responsiveness, feet elevated and chair laid flat with resolution of symptoms in <30 seconds, BP 132/72, chair back elevated half way BP 140/84, chair back full elevated 152/84      Pertinent Vitals/Pain Pain Assessment Pain Assessment: Faces Faces Pain Scale: Hurts even more Pain Location: knees Pain Descriptors / Indicators: Grimacing, Guarding Pain Intervention(s): Premedicated before session, Monitored during session, Limited activity within patient's tolerance, Repositioned    Home Living                          Prior Function            PT Goals (current goals can now be found in the care plan section) Acute Rehab PT Goals Patient Stated Goal: to return to independence PT Goal Formulation: With patient Time For Goal Achievement: 04/18/24 Progress towards PT goals: Not progressing toward goals - comment (pre-syncopal event this session)    Frequency    7X/week      PT Plan      Co-evaluation              AM-PAC PT 6 Clicks Mobility   Outcome Measure  Help needed turning from your back to your side while in a flat bed without using bedrails?: A Little Help needed moving from lying on your back to sitting on the side of a flat bed without using bedrails?: A Little Help needed moving to and from a bed to a chair (including a wheelchair)?: A Little Help needed standing up from a chair using your arms (e.g., wheelchair or bedside chair)?: A Lot Help needed to walk in hospital room?: A Lot Help needed climbing 3-5 steps with a railing? : Total 6 Click Score: 14    End of Session Equipment Utilized During Treatment: Gait belt Activity Tolerance: Treatment limited secondary to medical complications (Comment) (pre-syncopal event) Patient left: in bed;with call bell/phone within reach;with family/visitor present Nurse Communication: Mobility status PT Visit Diagnosis:  Other abnormalities of gait and mobility (R26.89);Muscle weakness (generalized) (M62.81);Pain Pain - Right/Left: Right Pain - part of body: Knee     Time: 8955-8881 PT Time Calculation (min) (ACUTE ONLY): 34 min  Charges:    $Therapeutic Activity: 23-37 mins PT General Charges $$ ACUTE PT VISIT: 1 Visit                     Cissy Galbreath R. PTA Acute Rehabilitation Services Office: [phone]   Therisa CHRISTELLA Boor 04/17/2024, 11:37 AM

## 2024-04-17 NOTE — Progress Notes (Signed)
 Pt instructed by wife to not take 8am Xarelto  this morning until 3 hours after working with physical therapy. RN did encourage patient. Per wife this is what was instructed to be done.  8am dose missed. Gherghe MD made aware, and informed RN pt is not to miss any doses. RN educated patient and wife.

## 2024-04-17 NOTE — Telephone Encounter (Signed)
 Ok for whatever they recommend

## 2024-04-17 NOTE — Telephone Encounter (Signed)
 Rx has been changed in system.

## 2024-04-17 NOTE — Progress Notes (Signed)
  Subjective: Patient stable.  Pain relatively well-controlled.  Somewhat reluctant to take pain medicine but when he does it does help him.   Objective: Vital signs in last 24 hours: Temp:  [98.6 F (37 C)-100.7 F (38.2 C)] 98.7 F (37.1 C) (08/29 0344) Pulse Rate:  [107-127] 107 (08/29 0344) Resp:  [16-18] 16 (08/29 0344) BP: (104-146)/(75-93) 146/78 (08/29 0344) SpO2:  [97 %-100 %] 98 % (08/29 0344)  Intake/Output from previous day: 08/28 0701 - 08/29 0700 In: 1020 [P.O.:1020] Out: 1700 [Urine:1700] Intake/Output this shift: No intake/output data recorded.  Exam:  Intact pulses distally Dorsiflexion/Plantar flexion intact No cellulitis present  Labs: Recent Labs    04/16/24 1347  HGB 11.9*   Recent Labs    04/16/24 1347  WBC 14.3*  RBC 3.81*  HCT 35.1*  PLT 232   Recent Labs    04/16/24 1347  NA 133*  K 3.7  CL 99  CO2 23  BUN 13  CREATININE 1.09  GLUCOSE 147*  CALCIUM 8.9   No results for input(s): LABPT, INR in the last 72 hours.  Assessment/Plan: Plan at this time is possible discharge this afternoon or tomorrow depending on how well he mobilizes.  Therapy to work with him this afternoon as well as this morning.  Currently he is on Xarelto  for DVT treatment of calf DVT.  He is fine to get up and around and mobilize with the CPM machine.   Roger Pace 04/17/2024, 7:42 AM

## 2024-04-17 NOTE — TOC Initial Note (Signed)
 Transition of Care [facility]) - Initial/Assessment Note    Patient Details  Name: Roger Pace MRN: [id number] Date of Birth: 1971/05/06  Transition of Care [facility]) CM/SW Contact:    Roger Jon Bloch, RN Phone Number: 04/17/2024, 10:30 AM  Clinical Narrative:                      -S/P bilateral TKA    From home with wife. PTA independent with ADL'S, no DME usage.  Home health services prearranged by provider's office with [facility] for home health PT services. Referral made with [employer] for DME needs. Wife to assist with care once home.  Pt without RX med concerns or transportation issues. TOC team will continue to monitor for needs...     Patient Goals and CMS Choice     Choice offered to / list presented to : Patient      Expected Discharge Plan and Services         Expected Discharge Date: 04/18/24               DME Arranged: Bedside commode, Walker rolling DME Agency: [facility] Date DME Agency Contacted: 04/15/24 Time DME Agency Contacted: 1630 Representative spoke with at DME Agency: [facility] Arranged: PT HH Agency: [facility] Date [facility] Agency Contacted: 04/15/24 Time HH Agency Contacted: 1640 Representative spoke with at [facility] Agency: [facility]  Prior Living Arrangements/Services                       Activities of Daily Living   ADL Screening (condition at time of admission) Independently performs ADLs?: Yes (appropriate for developmental age) Is the patient deaf or have difficulty hearing?: No Does the patient have difficulty seeing, even when wearing glasses/contacts?: No Does the patient have difficulty concentrating, remembering, or making decisions?: No  Permission Sought/Granted                  Emotional Assessment              Admission diagnosis:  Bilateral primary osteoarthritis of knee [M17.0] S/P TKR (total knee replacement), bilateral [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 [place] [address] Phone: [phone] Fax: [phone]  [facility] [address] [place] [address] Phone: [phone] Fax: [phone]     Social Drivers of Health (SDOH) Social History: SDOH Screenings   Food Insecurity: Patient Declined (04/14/2024)  Housing: Low Risk  (04/14/2024)  Transportation Needs: No Transportation Needs (04/14/2024)  Utilities: Not At Risk (04/14/2024)  Tobacco Use: Low Risk  (04/14/2024)   SDOH Interventions:     Readmission Risk Interventions     No data to display

## 2024-04-17 NOTE — Progress Notes (Signed)
 Physical Therapy Treatment Patient Details Name: Roger Pace MRN: [id number] DOB: [birth year] Today's Date: 04/17/2024   History of Present Illness 53 y.o. male presents to [facility] hospital on 04/14/2024 for elective bilateral TKA. PMH includes umbilical hernia repair, OA.    PT Comments  Pt reclined in chair on arrival, pleasant and agreeable to session, however session limited as pt with + orthostatic hypotension. Pt SBP dropping when feet placed on floor. Had pt perform seated UE exercise prior to standing. Pt needing max A to stand from recliner to RW with blocking provided at bil feet, to prevent anterior sliding as pt with limited knee flexion, and pt needing increased time to come to upright standing. Pt unable to maintain standing for BP reading as pt becoming diaphoretic and with c/o nausea. Pt returned to sitting with feet elevated, BP 86/62 (71). Chair reclined and BP 135/78 (95) at end of session. All vitals recorded in vitals tab. Encouraged pt to perform exercises between therapies as tolerated. Pt continues to benefit from skilled PT services to progress toward functional mobility goals.     If plan is discharge home, recommend the following: A little help with walking and/or transfers;A little help with bathing/dressing/bathroom;Assistance with cooking/housework;Assist for transportation;Help with stairs or ramp for entrance   Can travel by private vehicle        Equipment Recommendations  Rolling walker (2 wheels);BSC/3in1    Recommendations for Other Services       Precautions / Restrictions Precautions Precautions: Fall;Knee Precaution Booklet Issued: Yes (comment) Recall of Precautions/Restrictions: Intact Required Braces or Orthoses: Knee Immobilizer - Left Restrictions Weight Bearing Restrictions Per Provider Order: Yes RLE Weight Bearing Per Provider Order: Weight bearing as tolerated LLE Weight Bearing Per Provider Order: Weight bearing as tolerated      Mobility  Bed Mobility Overal bed mobility: Needs Assistance Bed Mobility: Supine to Sit, Sit to Supine     Supine to sit: Min assist     General bed mobility comments: pt up in chair on arrival    Transfers Overall transfer level: Needs assistance Equipment used: Rolling walker (2 wheels) Transfers: Sit to/from Stand, Bed to chair/wheelchair/BSC Sit to Stand: Max assist           General transfer comment: max A to stand from chair with assist anterior and blocking at bil feet as pt with limited knee flexion, increased time to rise and place hands on RW, pt unable to maintain standing for BP reading, returned to sitting with LE eleavted BP 86/62 (71)    Ambulation/Gait Ambulation/Gait assistance: Min assist Gait Distance (Feet): 2 Feet Assistive device: Rolling walker (2 wheels) Gait Pattern/deviations: Step-through pattern Gait velocity: decr     General Gait Details: unable due to [employer] Mobility     Tilt Bed    Modified Rankin (Stroke Patients Only)       Balance Overall balance assessment: Needs assistance Sitting-balance support: No upper extremity supported, Feet supported Sitting balance-Leahy Scale: Good     Standing balance support: Reliant on assistive device for balance Standing balance-Leahy Scale: Poor                              Communication Communication Communication: No apparent difficulties  Cognition Arousal: Alert Behavior During Therapy: WFL for tasks assessed/performed   PT - Cognitive impairments: No apparent impairments  Following commands: Intact      Cueing Cueing Techniques: Verbal cues  Exercises Other Exercises Other Exercises: this PTA encouraging HEP exercises throughout evening as toelrated    General Comments General comments (skin integrity, edema, etc.): BP reclined in chair on arrival 144/89 (105), sitting up with  feet on floor 112/76(87), Sitting with feet elevated after brief stand 86/62 (71), pt endorsing nausea with standing      Pertinent Vitals/Pain Pain Assessment Pain Assessment: Faces Faces Pain Scale: Hurts even more Pain Location: knees Pain Descriptors / Indicators: Grimacing, Guarding Pain Intervention(s): Monitored during session, Limited activity within patient's tolerance    Home Living                          Prior Function            PT Goals (current goals can now be found in the care plan section) Acute Rehab PT Goals Patient Stated Goal: to return to independence PT Goal Formulation: With patient Time For Goal Achievement: 04/18/24 Progress towards PT goals: Not progressing toward goals - comment (+ orthostatic hypotension)    Frequency    7X/week      PT Plan      Co-evaluation              AM-PAC PT 6 Clicks Mobility   Outcome Measure  Help needed turning from your back to your side while in a flat bed without using bedrails?: A Little Help needed moving from lying on your back to sitting on the side of a flat bed without using bedrails?: A Little Help needed moving to and from a bed to a chair (including a wheelchair)?: A Little Help needed standing up from a chair using your arms (e.g., wheelchair or bedside chair)?: A Lot Help needed to walk in hospital room?: A Lot Help needed climbing 3-5 steps with a railing? : Total 6 Click Score: 14    End of Session Equipment Utilized During Treatment: Gait belt Activity Tolerance: Treatment limited secondary to medical complications (Comment) (orthostatic hypotension) Patient left: in bed;with call bell/phone within reach;with family/visitor present Nurse Communication: Mobility status PT Visit Diagnosis: Other abnormalities of gait and mobility (R26.89);Muscle weakness (generalized) (M62.81);Pain Pain - Right/Left: Right Pain - part of body: Knee     Time: 8448-8386 PT Time  Calculation (min) (ACUTE ONLY): 22 min  Charges:    $Therapeutic Activity: 8-22 mins PT General Charges $$ ACUTE PT VISIT: 1 Visit                     Katonya Blecher R. PTA Acute Rehabilitation Services Office: [phone]   Therisa CHRISTELLA Boor 04/17/2024, 4:25 PM

## 2024-04-17 NOTE — Progress Notes (Signed)
 PROGRESS NOTE  Roger Pace FMW:[id number] DOB: December 16, 1970 DOA: 04/14/2024 PCP: Hughie Sharper, MD   LOS: 3 days   Brief Narrative / Interim history: 53 y.o. male with medical history significant of obesity, arthritis who presented for bilateral TKA surgery.  Was found to have fevers and on further workup was found to have acute DVT, hospitalist team consulted  Subjective / 24h Interval events: He is doing well today, denies any bleeding, no chest pain, no shortness of breath, no cough or chest congestion.  No burning with urination, no nausea or vomiting, no diarrhea  Assesement and Plan: Principal Problem:   S/P TKR (total knee replacement), bilateral Active Problems:   Class 2 obesity without serious comorbidity with body mass index (BMI) of 38.0 to 38.9 in adult   DVT (deep venous thrombosis) (HCC)   Tachycardia   Fever  Principal problem Acute DVT -DVT study showed bilateral acute DVTs, started on Xarelto , continue.  He needs a starter pack.  Still had a temp of 100.7 last night, suspect related to DVT as he has no infectious symptoms - Should be stable to go home anytime  Active problems Status post bilateral TKR-per primary  Obesity, class II-BMI 35.7, he would benefit from weight loss  Scheduled Meds:  docusate sodium   100 mg Oral BID   gabapentin   200 mg Oral TID   Rivaroxaban   15 mg Oral BID WC   Followed by   NOREEN ON 05/07/2024] rivaroxaban   20 mg Oral Q supper   Continuous Infusions:  sodium chloride      lactated ringers  Stopped (04/14/24 1259)   PRN Meds:.acetaminophen , HYDROmorphone  (DILAUDID ) injection, menthol -cetylpyridinium **OR** phenol, methocarbamol  **OR** methocarbamol  (ROBAXIN ) injection, metoCLOPramide  **OR** metoCLOPramide  (REGLAN ) injection, metoprolol  tartrate, ondansetron , oxyCODONE   Current Outpatient Medications  Medication Instructions   acetaminophen  (TYLENOL ) 325-650 mg, Oral, Every 6 hours PRN   Ascorbic Acid (VITAMIN C PO) 1  tablet, Daily at bedtime   b complex vitamins capsule 1 capsule, Daily at bedtime   celecoxib  (CELEBREX ) 200 mg, Oral, 2 times daily PRN   Cholecalciferol (VITAMIN D -3 PO) 1 tablet, Daily at bedtime   Collagen-Vitamin C-Biotin (COLLAGEN PO) 1 capsule, Daily at bedtime   CREATINE PO 1 Scoop, Daily at bedtime   docusate sodium  (COLACE) 100 mg, Oral, 2 times daily   gabapentin  (NEURONTIN ) 200 mg, Oral, 3 times daily   MAGNESIUM PO 1 capsule, Daily at bedtime   methocarbamol  (ROBAXIN ) 500 mg, Oral, Every 6 hours PRN   oxyCODONE  (OXY IR/ROXICODONE ) 5-10 mg, Oral, Every 4 hours PRN   POTASSIUM PO 1 capsule, Daily at bedtime   rivaroxaban  (XARELTO ) 2.5 MG TABS tablet Take 6 tablets (15 mg total) by mouth 2 (two) times daily with a meal for 90 days, THEN 8 tablets (20 mg total) daily with supper.    Diet Orders (From admission, onward)     Start     Ordered   04/17/24 0000  Diet - low sodium heart healthy        04/17/24 0756   04/14/24 1537  Diet regular Room service appropriate? Yes; Fluid consistency: Thin  Diet effective now       Question Answer Comment  Room service appropriate? Yes   Fluid consistency: Thin      04/14/24 1536            DVT prophylaxis: SCDs Start: 04/14/24 1537 Place TED hose Start: 04/14/24 1537 Rivaroxaban  (XARELTO ) tablet 15 mg  rivaroxaban  (XARELTO ) tablet 20 mg   Lab Results  Component Value Date   PLT 229 04/17/2024      Code Status: Full Code  Family Communication: Wife at bedside  Level of care: Med-Surg  Objective: Vitals:   04/16/24 1350 04/16/24 1959 04/17/24 0344 04/17/24 0900  BP: (!) 137/93 (!) 146/75 (!) 146/78 (!) 144/76  Pulse:  (!) 127 (!) 107   Resp: 17 18 16 16   Temp: 98.6 F (37 C) (!) 100.7 F (38.2 C) 98.7 F (37.1 C) 98.8 F (37.1 C)  TempSrc: Oral Oral Oral Oral  SpO2: 100% 97% 98% 100%  Weight:      Height:        Intake/Output Summary (Last 24 hours) at 04/17/2024 0946 Last data filed at 04/16/2024  2000 Gross per 24 hour  Intake 780 ml  Output 1700 ml  Net -920 ml   Wt Readings from Last 3 Encounters:  04/14/24 117.9 kg  04/09/24 121.1 kg  10/23/23 123.1 kg    Examination:  Constitutional: NAD Eyes: no scleral icterus ENMT: Mucous membranes are moist.  Neck: normal, supple Respiratory: clear to auscultation bilaterally, no wheezing, no crackles.  Cardiovascular: Regular rate and rhythm, no murmurs / rubs / gallops.  Abdomen: non distended, no tenderness. Bowel sounds positive.  Musculoskeletal: no clubbing / cyanosis.    Data Reviewed: I have independently reviewed following labs and imaging studies   CBC Recent Labs  Lab 04/16/24 1347 04/17/24 0750  WBC 14.3* 14.4*  HGB 11.9* 11.5*  HCT 35.1* 33.7*  PLT 232 229  MCV 92.1 91.3  MCH 31.2 31.2  MCHC 33.9 34.1  RDW 13.3 13.1  LYMPHSABS  --  2.5  MONOABS  --  1.5*  EOSABS  --  0.0  BASOSABS  --  0.0    Recent Labs  Lab 04/16/24 1347 04/17/24 0750  NA 133* 134*  K 3.7 3.5  CL 99 95*  CO2 23 24  GLUCOSE 147* 138*  BUN 13 11  CREATININE 1.09 1.12  CALCIUM 8.9 8.9  AST  --  40  ALT  --  18  ALKPHOS  --  33*  BILITOT  --  1.2  ALBUMIN  --  3.0*    ------------------------------------------------------------------------------------------------------------------ No results for input(s): CHOL, HDL, LDLCALC, TRIG, CHOLHDL, LDLDIRECT in the last 72 hours.  Lab Results  Component Value Date   HGBA1C 5.5 03/17/2021   ------------------------------------------------------------------------------------------------------------------ No results for input(s): TSH, T4TOTAL, T3FREE, THYROIDAB in the last 72 hours.  Invalid input(s): FREET3  Cardiac Enzymes No results for input(s): CKMB, TROPONINI, MYOGLOBIN in the last 168 hours.  Invalid input(s): CK ------------------------------------------------------------------------------------------------------------------ No  results found for: BNP  CBG: No results for input(s): GLUCAP in the last 168 hours.  Recent Results (from the past 240 hours)  Surgical pcr screen     Status: None   Collection Time: 04/09/24  8:03 AM   Specimen: Nasal Mucosa; Nasal Swab  Result Value Ref Range Status   MRSA, PCR NEGATIVE NEGATIVE Final   Staphylococcus aureus NEGATIVE NEGATIVE Final    Comment: (NOTE) The Xpert SA Assay (FDA approved for NASAL specimens in patients 13 years of age and older), is one component of a comprehensive surveillance program. It is not intended to diagnose infection nor to guide or monitor treatment. Performed at [facility], [address], [place], [place] [address]      Radiology Studies: DG CHEST PORT 1 VIEW Result Date: 04/16/2024 CLINICAL DATA:  Leukocytosis EXAM: PORTABLE CHEST 1 VIEW COMPARISON:  None Available. FINDINGS: The  heart size and mediastinal contours are within normal limits. Both lungs are clear. The visualized skeletal structures are unremarkable. IMPRESSION: No active disease. Electronically Signed   By: Luke Bun M.D.   On: 04/16/2024 15:39   VAS US  LOWER EXTREMITY VENOUS (DVT) Result Date: 04/16/2024  Lower Venous DVT Study Patient Name:  Roger Pace  Date of Exam:   04/16/2024 Medical Rec #: [id number]          Accession #:    [id number] Date of Birth: [birth year]          Patient Gender: M Patient Age:   27 years Exam Location:  [facility] Procedure:      VAS US  LOWER EXTREMITY VENOUS (DVT) Referring Phys: CARLIN CALIX --------------------------------------------------------------------------------  Indications: Swelling, and Edema.  Risk Factors: Surgery Bilat knee surgery 04/14/2024. Performing Technologist: Elmarie Lindau, RVT  Examination Guidelines: A complete evaluation includes B-mode imaging, spectral Doppler, color Doppler, and power Doppler as needed of all accessible portions of each vessel. Bilateral testing is considered an integral  part of a complete examination. Limited examinations for reoccurring indications may be performed as noted. The reflux portion of the exam is performed with the patient in reverse Trendelenburg.  +---------+---------------+---------+-----------+----------+--------------+ RIGHT    CompressibilityPhasicitySpontaneityPropertiesThrombus Aging +---------+---------------+---------+-----------+----------+--------------+ CFV      Full           Yes      Yes                                 +---------+---------------+---------+-----------+----------+--------------+ SFJ      Full                                                        +---------+---------------+---------+-----------+----------+--------------+ FV Prox  Full                                                        +---------+---------------+---------+-----------+----------+--------------+ FV Mid   Full                                                        +---------+---------------+---------+-----------+----------+--------------+ FV DistalFull                                                        +---------+---------------+---------+-----------+----------+--------------+ PFV      Full                                                        +---------+---------------+---------+-----------+----------+--------------+ POP      Full           Yes  Yes                                 +---------+---------------+---------+-----------+----------+--------------+ PTV      Full                                                        +---------+---------------+---------+-----------+----------+--------------+ PERO     None                                         Acute          +---------+---------------+---------+-----------+----------+--------------+   +---------+---------------+---------+-----------+----------+--------------+ LEFT     CompressibilityPhasicitySpontaneityPropertiesThrombus Aging  +---------+---------------+---------+-----------+----------+--------------+ CFV      Full           Yes      Yes                                 +---------+---------------+---------+-----------+----------+--------------+ SFJ      Full                                                        +---------+---------------+---------+-----------+----------+--------------+ FV Prox  Full                                                        +---------+---------------+---------+-----------+----------+--------------+ FV Mid   Full                                                        +---------+---------------+---------+-----------+----------+--------------+ FV DistalFull                                                        +---------+---------------+---------+-----------+----------+--------------+ PFV      Full                                                        +---------+---------------+---------+-----------+----------+--------------+ POP      Full           Yes      Yes                                 +---------+---------------+---------+-----------+----------+--------------+ PTV      None  Acute          +---------+---------------+---------+-----------+----------+--------------+ PERO     Full                                                        +---------+---------------+---------+-----------+----------+--------------+     *See table(s) above for measurements and observations. Electronically signed by Gaile New MD on 04/16/2024 at 12:29:37 PM.    Final      Nilda Fendt, MD, PhD [facility]  Between 7 am - 7 pm I am available, please contact me via Amion (for emergencies) or Securechat (non urgent messages)  Between 7 pm - 7 am I am not available, please contact night coverage MD/APP via Amion

## 2024-04-17 NOTE — Telephone Encounter (Signed)
  [facility] called and stated that insurance will not pay for Xarelto  6 pills twice a day for 90 days and instead recommends 15 mg 2x day for 21 followed by 20 mg daily (dose card).  Sent epic message. [phone] Nadara (pharmacist)

## 2024-04-18 DIAGNOSIS — Z96653 Presence of artificial knee joint, bilateral: Secondary | ICD-10-CM | POA: Diagnosis not present

## 2024-04-18 MED ORDER — SODIUM CHLORIDE 0.9 % IV BOLUS
1000.0000 mL | Freq: Once | INTRAVENOUS | Status: AC
  Administered 2024-04-18: 1000 mL via INTRAVENOUS

## 2024-04-18 NOTE — Progress Notes (Signed)
 Patient stable.  Pain controlled.  Up to 70 degrees CPM both knees  Had orthostatic hypotension when trying to get up and around yesterday. Currently on Xarelto  for bilateral calf DVTs   Did have fever last night.  Mild effusion in both knees otherwise unremarkable.  At this time I want to put the discharge on hold until we can find out the source of the orthostatic hypotension.  Hemoglobin is good.  Would like physical therapy to evaluate again today in terms of orthostatic hypotension.  We also need medical service to evaluate source of or reason for his orthostatic hypotension.  We will put discharge on hold for now.

## 2024-04-18 NOTE — Progress Notes (Addendum)
 Physical Therapy Treatment Patient Details Name: Roger Pace MRN: [id number] DOB: [birth year] Today's Date: 04/18/2024   History of Present Illness 53 y.o. male presents to [facility] hospital on 04/14/2024 for elective bilateral TKA. Was found to have fevers and on further workup was found to have bilateral LE acute DVT. Pt limited due to symptomatic orthostatic hypotension 8/29 and 8/30 PT sessions. PMH includes umbilical hernia repair, OA.    PT Comments  Pt received in supine ~60 mins after PO pain meds given, spouse present and encouraging, pt with good participation in transfer training and close VS assessment with positional changes. Pt with mildly symptomatic orthostatic hypotension but much improved VS from previous date, pt seen after IV bolus given and with bil thigh high TED hose in place during session. Pt with significant tachycardia in standing and moderate to severe pain limiting gait distance. Pt agreeable to sit up in chair and order lunch at end of session. Plan to return later to work on gait progression if HR/BP allow, pt/spouse agreeable. Discussion on disposition with pt/supervising PT Ryan L as pt making slower progress with goals past few sessions with new medical complications, pt/family agreeable to looking into short term higher intensity post-acute rehab >3 hours per day if eligible, will need OT re-consult for work-up. Vital Signs  Patient Position (if appropriate) Orthostatic Vitals  Orthostatic Lying   BP- Lying 141/75  Pulse- Lying 100  Orthostatic Sitting  BP- Sitting 129/69  Pulse- Sitting 121  Orthostatic Standing at 0 minutes  BP- Standing at 0 minutes 112/63 (pain/fatigue but no nausea today)  Pulse- Standing at 0 minutes 146     If plan is discharge home, recommend the following: A little help with walking and/or transfers;A little help with bathing/dressing/bathroom;Assistance with cooking/housework;Assist for transportation;Help with stairs or ramp for  entrance   Can travel by private vehicle        Equipment Recommendations  Rolling walker (2 wheels);BSC/3in1    Recommendations for Other Services Rehab consult;OT consult     Precautions / Restrictions Precautions Precautions: Fall;Knee Precaution Booklet Issued: Yes (comment) Recall of Precautions/Restrictions: Intact Required Braces or Orthoses: Knee Immobilizer - Left Knee Immobilizer - Right: Other (comment) (per Dr. Addie, try to alternate knee immobilizer between legs to allow each leg the chance to bear more of the load when mobilizing) Knee Immobilizer - Left: Other (comment) Restrictions Weight Bearing Restrictions Per Provider Order: Yes RLE Weight Bearing Per Provider Order: Weight bearing as tolerated LLE Weight Bearing Per Provider Order: Weight bearing as tolerated     Mobility  Bed Mobility Overal bed mobility: Needs Assistance Bed Mobility: Supine to Sit     Supine to sit: Contact guard, HOB elevated     General bed mobility comments: increased time/effort, pt has elevating HOB at home and states bed is very high, so bed in this posture with HOB ~40-50 degrees during transfer.    Transfers Overall transfer level: Needs assistance Equipment used: Rolling walker (2 wheels) Transfers: Sit to/from Stand, Bed to chair/wheelchair/BSC Sit to Stand: +2 physical assistance, Mod assist, From elevated surface           General transfer comment: Heavy modA +2 to stand from very elevated bed height>RW, upon standing pt denies lightheadedness but pain and tachycardia limiting standing tolerance, pt agreeable to sit in chair to rest and order his lunch.    Ambulation/Gait Ambulation/Gait assistance: Min assist Gait Distance (Feet): 6 Feet Assistive device: Rolling walker (2 wheels) Gait Pattern/deviations: Step-to pattern,  Shuffle, Trunk flexed, Antalgic       General Gait Details: low, short steps, LLE KI intact during transfer and no buckling. Pt HR  observed to be 146 bpm while standing and stepping to chair, so defer longer gait trial 2/2 pain, somewhat orthostatic BP from supine to standing (see above) and tachycardia, RN notified. No c/o nausea or severe lightheadedness, but pt c/o pain and some LE fatigue and weakness while stepping; minA for AD management/safety PRN, spouse guarding on opposite side and assist with IV pole/line for safety.   Stairs             Wheelchair Mobility     Tilt Bed    Modified Rankin (Stroke Patients Only)       Balance Overall balance assessment: Needs assistance Sitting-balance support: No upper extremity supported, Feet supported Sitting balance-Leahy Scale: Good     Standing balance support: Reliant on assistive device for balance Standing balance-Leahy Scale: Poor Standing balance comment: RW                            Communication Communication Communication: No apparent difficulties  Cognition Arousal: Alert Behavior During Therapy: WFL for tasks assessed/performed   PT - Cognitive impairments: No apparent impairments                       PT - Cognition Comments: pleasantly cooperative Following commands: Intact      Cueing Cueing Techniques: Verbal cues  Exercises Total Joint Exercises Ankle Circles/Pumps: AROM, Both, 10 reps, Seated (EOB) Quad Sets:  (verbal cues to perform TID at least 10) Heel Slides: AAROM, Left, 5 reps, Supine Long Arc Quad: AROM, AAROM, Right, 5 reps, Seated (LLE has KI donned so did HS prior to OOB) Goniometric ROM: grossly <40 deg flexion on LLE with supine heel slides, slightly better on RLE while seated EOB Other Exercises Other Exercises: IS x 5 reps cues for technique, pt achieves 1700 to 2200 depending on technique    General Comments General comments (skin integrity, edema, etc.): see BP in comments above; tachy, SpO2 WFL on RA      Pertinent Vitals/Pain Pain Assessment Pain Assessment: Faces Faces Pain  Scale: Hurts even more Pain Location: BLE with knee flexion and STS; ~4/10 once seated in chair Pain Descriptors / Indicators: Discomfort, Grimacing, Operative site guarding Pain Intervention(s): Limited activity within patient's tolerance, Monitored during session, Repositioned, Premedicated before session    Home Living                          Prior Function            PT Goals (current goals can now be found in the care plan section) Acute Rehab PT Goals Patient Stated Goal: to return to independence PT Goal Formulation: With patient Time For Goal Achievement: 04/18/24 Progress towards PT goals: Progressing toward goals    Frequency    7X/week      PT Plan      Co-evaluation              AM-PAC PT 6 Clicks Mobility   Outcome Measure  Help needed turning from your back to your side while in a flat bed without using bedrails?: A Little Help needed moving from lying on your back to sitting on the side of a flat bed without using bedrails?: A Little (pt has adjustable HOB)  Help needed moving to and from a bed to a chair (including a wheelchair)?: A Little Help needed standing up from a chair using your arms (e.g., wheelchair or bedside chair)?: A Lot Help needed to walk in hospital room?: Total (<65ft) Help needed climbing 3-5 steps with a railing? : Total 6 Click Score: 13    End of Session Equipment Utilized During Treatment: Gait belt;Other (comment) (TED hose (thigh high) intact) Activity Tolerance: Other (comment);Patient limited by pain;Treatment limited secondary to medical complications (Comment) (tachy and some drop in BP, less severe symptoms than previous day per pt/spouse report) Patient left: with call bell/phone within reach;with family/visitor present;in chair (spouse in room) Nurse Communication: Mobility status;Other (comment);Precautions (BP/HR) PT Visit Diagnosis: Other abnormalities of gait and mobility (R26.89);Muscle weakness  (generalized) (M62.81);Pain Pain - Right/Left: Right Pain - part of body: Knee     Time: 1202-1233 PT Time Calculation (min) (ACUTE ONLY): 31 min  Charges:    $Therapeutic Exercise: 8-22 mins $Therapeutic Activity: 8-22 mins PT General Charges $$ ACUTE PT VISIT: 1 Visit                     Becca Bayne P., PTA Acute Rehabilitation Services Secure Chat Preferred 9a-5:30pm Office: [phone]    Connell HERO Murrell Dome 04/18/2024, 2:00 PM

## 2024-04-18 NOTE — Progress Notes (Signed)
   04/18/24 1442  Assess: MEWS Score  Temp 100 F (37.8 C)  BP (!) 164/80  MAP (mmHg) 100  Pulse Rate (!) 120  Resp 18  SpO2 96 %  O2 Device Room Air  Assess: MEWS Score  MEWS Temp 0  MEWS Systolic 0  MEWS Pulse 2  MEWS RR 0  MEWS LOC 0  MEWS Score 2  MEWS Score Color Yellow  Assess: if the MEWS score is Yellow or Red  Were vital signs accurate and taken at a resting state? Yes  Does the patient meet 2 or more of the SIRS criteria? No  MEWS guidelines implemented  Yes, yellow  Treat  MEWS Interventions Considered administering scheduled or prn medications/treatments as ordered  Take Vital Signs  Increase Vital Sign Frequency  Yellow: Q2hr x1, continue Q4hrs until patient remains green for 12hrs  Escalate  MEWS: Escalate Yellow: Discuss with charge nurse and consider notifying provider and/or RRT  Notify: Charge Nurse/RN  Name of Charge Nurse/RN Notified Joe RN  Provider Notification  Provider Name/Title MD Trixie  Date Provider Notified 04/18/24  Time Provider Notified 1518  Method of Notification Page (secure chat)  Notification Reason Change in status  Provider response See new orders (ECHO)  Date of Provider Response 04/18/24  Time of Provider Response 1526  Assess: SIRS CRITERIA  SIRS Temperature  0  SIRS Respirations  0  SIRS Pulse 1  SIRS WBC 0  SIRS Score Sum  1

## 2024-04-18 NOTE — Progress Notes (Cosign Needed Addendum)
 Physical Therapy Treatment Patient Details Name: Roger Pace MRN: [id number] DOB: 01/19/1971 Today's Date: 04/18/2024   History of Present Illness 53 y.o. male presents to [facility] on 04/14/2024 for elective bilateral TKA. Was found to have fevers and on further workup was found to have bilateral LE acute DVT. Pt limited due to symptomatic orthostatic hypotension 8/29 and 8/30 PT sessions. PMH includes umbilical hernia repair, OA.    PT Comments  Pt received in recliner, agreeable to therapy session after pain meds given, pt HR remains elevated at rest ~120 bpm. Pt needing up to +2 maxA for sit>stand from recliner chair to [employer]. Pt performed a few weight shifts standing in [place] platform but limited due to severe drop in SBP upon standing, with tachycardia and pt lightheaded/diaphoretic. Pt then returned to supine, kept bil thigh high TED hose in place given symptomatic drop in BP. Iceman x2 re-filled and donned and heels floated for comfort, pt encouraged to perform supine BLE HEP exercises again in PM per handout once symptoms improve and pain meds kick in more. Patient will benefit from intensive inpatient follow-up therapy, >3 hours/day, discussed with pt, supervising PT Ryan L and pt family, who are in agreement.  Vital Signs  Patient Position (if appropriate) Orthostatic Vitals  Orthostatic Lying   BP- Lying 153/75 (recliner, upright posture)  Pulse- Lying 120  Orthostatic Standing at 0 minutes  BP- Standing at 0 minutes 101/57  Pulse- Standing at 0 minutes 146   Orthostatic Lying   BP- Lying 123/73 (supine HOB ~30 deg post-exertion)  Pulse- Lying 135     If plan is discharge home, recommend the following: A little help with walking and/or transfers;A little help with bathing/dressing/bathroom;Assistance with cooking/housework;Assist for transportation;Help with stairs or ramp for entrance   Can travel by private vehicle        Equipment Recommendations  Rolling  walker (2 wheels);BSC/3in1    Recommendations for Other Services Rehab consult;OT consult     Precautions / Restrictions Precautions Precautions: Fall;Knee Precaution Booklet Issued: Yes (comment) Recall of Precautions/Restrictions: Intact Required Braces or Orthoses: Knee Immobilizer - Left Knee Immobilizer - Right: Other (comment) (per Dr. Addie, try to alternate knee immobilizer between legs to allow each leg the chance to bear more of the load when mobilizing) Knee Immobilizer - Left: Other (comment) Restrictions Weight Bearing Restrictions Per Provider Order: Yes RLE Weight Bearing Per Provider Order: Weight bearing as tolerated LLE Weight Bearing Per Provider Order: Weight bearing as tolerated     Mobility  Bed Mobility Overal bed mobility: Needs Assistance Bed Mobility: Sit to Supine     Supine to sit: Contact guard, HOB elevated Sit to supine: Min assist, HOB elevated, Used rails   General bed mobility comments: pt using gait belt for RLE elevation, minA for LLE and light CGA to minA for RLE over edge of bed. pt using BUE well to scoot hips and pivot, needs some bed rail use as well.    Transfers Overall transfer level: Needs assistance Equipment used: Ambulation equipment used Transfers: Sit to/from Stand, Bed to chair/wheelchair/BSC Sit to Stand: +2 physical assistance, From elevated surface, Max assist, Via lift equipment           General transfer comment: Heavy +2 maxA to stand from lower chair height to [place], with [place] platform  further in front of him to allow for more knee extension, PTA and mobility specialists using gait belt and bed pad lift assist and pt with heavy BUE  reliance on Stedy platform rail and flaps to pull to upright posture, then able to weight shift slightly to full upright. Defer sitting in Stedy flaps due to R KI and pt good standing balance, so pivoted him with +2 for safety from chair to bedside and pt returned to elevated EOB from  [place], then to supine as he was diaphoretic and lightheaded post-transfer. Pt stood ~1.5-2 mins total. Transfer via Lift Equipment: Stedy  Ambulation/Gait Ambulation/Gait assistance: Editor, commissioning (Feet): 6 Feet Assistive device: Rolling walker (2 wheels) Gait Pattern/deviations: Step-to pattern, Shuffle, Trunk flexed, Antalgic     Pre-gait activities: standing weight shift to L/R sides in [place] platform a few reps but not able to take steps 2/2 orthostatic hypotension symptoms General Gait Details: defer; orthostatic, diaphoretic   Stairs             Wheelchair Mobility     Tilt Bed    Modified Rankin (Stroke Patients Only)       Balance Overall balance assessment: Needs assistance Sitting-balance support: No upper extremity supported, Feet supported Sitting balance-Leahy Scale: Good     Standing balance support: Reliant on assistive device for balance Standing balance-Leahy Scale: Poor Standing balance comment: RW                            Communication Communication Communication: No apparent difficulties  Cognition Arousal: Alert Behavior During Therapy: WFL for tasks assessed/performed   PT - Cognitive impairments: No apparent impairments                       PT - Cognition Comments: pleasantly cooperative Following commands: Intact      Cueing Cueing Techniques: Verbal cues  Exercises Total Joint Exercises Ankle Circles/Pumps: AROM, Both, 10 reps, Seated, Supine Quad Sets:  (verbal cues to perform TID at least 10) Heel Slides: AAROM, Left, 5 reps, Supine Long Arc Quad: AROM, AAROM, Both, Seated (EOB a few reps after KI removed) Goniometric ROM: 40-50 deg seated EOB bilaterally; limited due to pain Other Exercises Other Exercises: encourage IS use hourly    General Comments General comments (skin integrity, edema, etc.): orthostatic hypotension and tachy, see BP in comments above      Pertinent Vitals/Pain  Pain Assessment Pain Assessment: Faces Faces Pain Scale: Hurts even more Pain Location: BLE with knee flexion and STS Pain Descriptors / Indicators: Discomfort, Grimacing, Operative site guarding, Sharp Pain Intervention(s): Limited activity within patient's tolerance, Monitored during session, Premedicated before session, Repositioned, Ice applied    Home Living                          Prior Function            PT Goals (current goals can now be found in the care plan section) Acute Rehab PT Goals Patient Stated Goal: to return to independence PT Goal Formulation: With patient Time For Goal Achievement: 04/18/24 Progress towards PT goals: Not progressing toward goals - comment (symptomatic drop in BP, discussed with supervising PT and updating dispo rec to AIR)    Frequency    7X/week      PT Plan      Co-evaluation              AM-PAC PT 6 Clicks Mobility   Outcome Measure  Help needed turning from your back to your side while in a flat bed  without using bedrails?: A Little Help needed moving from lying on your back to sitting on the side of a flat bed without using bedrails?: A Little (pt has adjustable HOB) Help needed moving to and from a bed to a chair (including a wheelchair)?: A Lot (+2 safety) Help needed standing up from a chair using your arms (e.g., wheelchair or bedside chair)?: Total (from standard chair height) Help needed to walk in hospital room?: Total (<61ft) Help needed climbing 3-5 steps with a railing? : Total 6 Click Score: 11    End of Session Equipment Utilized During Treatment: Gait belt;Other (comment) (TED hose (thigh high) intact) Activity Tolerance: Other (comment);Patient limited by pain;Treatment limited secondary to medical complications (Comment) (symptomatic orthostatic hypotension; tachy HR) Patient left: with call bell/phone within reach;with family/visitor present;in bed (spouse in room, pt heels floated; iceman  x2 re-donned and refilled) Nurse Communication: Mobility status;Other (comment);Precautions;Need for lift equipment (BP/HR and iceman refilled) PT Visit Diagnosis: Other abnormalities of gait and mobility (R26.89);Muscle weakness (generalized) (M62.81);Pain Pain - Right/Left: Right Pain - part of body: Knee     Time: 8452-8384 PT Time Calculation (min) (ACUTE ONLY): 28 min  Charges:    $Therapeutic Activity: 23-37 mins PT General Charges $$ ACUTE PT VISIT: 1 Visit                     Amadeo Coke P., PTA Acute Rehabilitation Services Secure Chat Preferred 9a-5:30pm Office: [phone]    Connell CHRISTELLA Blue 04/18/2024, 5:18 PM

## 2024-04-18 NOTE — Progress Notes (Signed)
 PROGRESS NOTE  Roger Pace FMW:[id number] DOB: [birth year] DOA: 04/14/2024 PCP: Hughie Sharper, MD   LOS: 4 days   Brief Narrative / Interim history: 53 y.o. male with medical history significant of obesity, arthritis who presented for bilateral TKA surgery.  Was found to have fevers and on further workup was found to have acute DVT, hospitalist team consulted  Subjective / 24h Interval events: He is doing well today, denies any bleeding, no chest pain, no shortness of breath, no cough or chest congestion.  No burning with urination, no nausea or vomiting, no diarrhea  Assesement and Plan: Principal Problem:   S/P TKR (total knee replacement), bilateral Active Problems:   Class 2 obesity without serious comorbidity with body mass index (BMI) of 38.0 to 38.9 in adult   DVT (deep venous thrombosis) (HCC)   Tachycardia   Fever  Principal problem Acute DVT -DVT study showed bilateral acute DVTs, started on Xarelto , continue.  He needs a starter pack.  Continues to spike fevers, he is asymptomatic and I again do not suspect an infectious process, however he was just instrumented and for completeness we will obtain blood cultures  Active problems Dizziness, orthostatic hypotension -this may be multifactorial in this patient who has been mostly in bed over the last 5 days, is postoperative, is intermittently febrile, is receiving pain medications which all can decrease blood pressure.  He has been eating well but has not been drinking as many fluids.  Will bolus 1 L of fluids today, recheck orthostatic afterwards, place compression stockings  Status post bilateral TKR-per primary  Obesity, class II-BMI 35.7, he would benefit from weight loss  Scheduled Meds:  docusate sodium   100 mg Oral BID   gabapentin   200 mg Oral TID   Rivaroxaban   15 mg Oral BID WC   Followed by   NOREEN ON 05/07/2024] rivaroxaban   20 mg Oral Q supper   Continuous Infusions:  sodium chloride      lactated  ringers  Stopped (04/14/24 1259)   PRN Meds:.acetaminophen , HYDROmorphone  (DILAUDID ) injection, menthol -cetylpyridinium **OR** phenol, methocarbamol  **OR** methocarbamol  (ROBAXIN ) injection, metoCLOPramide  **OR** metoCLOPramide  (REGLAN ) injection, metoprolol  tartrate, ondansetron , oxyCODONE   Current Outpatient Medications  Medication Instructions   acetaminophen  (TYLENOL ) 325-650 mg, Oral, Every 6 hours PRN   Ascorbic Acid (VITAMIN C PO) 1 tablet, Daily at bedtime   b complex vitamins capsule 1 capsule, Daily at bedtime   celecoxib  (CELEBREX ) 200 mg, Oral, 2 times daily PRN   Cholecalciferol (VITAMIN D -3 PO) 1 tablet, Daily at bedtime   Collagen-Vitamin C-Biotin (COLLAGEN PO) 1 capsule, Daily at bedtime   CREATINE PO 1 Scoop, Daily at bedtime   docusate sodium  (COLACE) 100 mg, Oral, 2 times daily   gabapentin  (NEURONTIN ) 200 mg, Oral, 3 times daily   MAGNESIUM  PO 1 capsule, Daily at bedtime   methocarbamol  (ROBAXIN ) 500 mg, Oral, Every 6 hours PRN   oxyCODONE  (OXY IR/ROXICODONE ) 5-10 mg, Oral, Every 4 hours PRN   POTASSIUM PO 1 capsule, Daily at bedtime   RIVAROXABAN  (XARELTO ) VTE STARTER PACK (15 & 20 MG) Follow package directions: Take one 15mg  tablet by mouth twice a day. On day 22, switch to one 20mg  tablet once a day. Take with food.    Diet Orders (From admission, onward)     Start     Ordered   04/17/24 0000  Diet - low sodium heart healthy        04/17/24 0756   04/14/24 1537  Diet regular Room service appropriate? Yes; Fluid consistency:  Thin  Diet effective now       Question Answer Comment  Room service appropriate? Yes   Fluid consistency: Thin      04/14/24 1536            DVT prophylaxis: SCDs Start: 04/14/24 1537 Place TED hose Start: 04/14/24 1537 Rivaroxaban  (XARELTO ) tablet 15 mg  rivaroxaban  (XARELTO ) tablet 20 mg   Lab Results  Component Value Date   PLT 229 04/17/2024      Code Status: Full Code  Family Communication: Wife at bedside  Level of  care: Med-Surg  Objective: Vitals:   04/17/24 2009 04/17/24 2305 04/18/24 0419 04/18/24 0816  BP: (!) 141/82  (!) 158/85 125/79  Pulse: (!) 117  (!) 110 (!) 109  Resp: 18  17 19   Temp: (!) 101.8 F (38.8 C) 99.2 F (37.3 C) 100.2 F (37.9 C) 99.7 F (37.6 C)  TempSrc: Oral  Oral Oral  SpO2: 100%  100% 98%  Weight:      Height:        Intake/Output Summary (Last 24 hours) at 04/18/2024 1051 Last data filed at 04/17/2024 2015 Gross per 24 hour  Intake 240 ml  Output 400 ml  Net -160 ml   Wt Readings from Last 3 Encounters:  04/14/24 117.9 kg  04/09/24 121.1 kg  10/23/23 123.1 kg    Examination:  Constitutional: NAD Eyes: lids and conjunctivae normal, no scleral icterus ENMT: mmm Neck: normal, supple Respiratory: clear to auscultation bilaterally, no wheezing, no crackles.  Cardiovascular: Regular rate and rhythm, no murmurs / rubs / gallops.  Abdomen: soft, no distention, no tenderness. Bowel sounds positive.    Data Reviewed: I have independently reviewed following labs and imaging studies   CBC Recent Labs  Lab 04/16/24 1347 04/17/24 0750  WBC 14.3* 14.4*  HGB 11.9* 11.5*  HCT 35.1* 33.7*  PLT 232 229  MCV 92.1 91.3  MCH 31.2 31.2  MCHC 33.9 34.1  RDW 13.3 13.1  LYMPHSABS  --  2.5  MONOABS  --  1.5*  EOSABS  --  0.0  BASOSABS  --  0.0    Recent Labs  Lab 04/16/24 1347 04/17/24 0750  NA 133* 134*  K 3.7 3.5  CL 99 95*  CO2 23 24  GLUCOSE 147* 138*  BUN 13 11  CREATININE 1.09 1.12  CALCIUM  8.9 8.9  AST  --  40  ALT  --  18  ALKPHOS  --  33*  BILITOT  --  1.2  ALBUMIN  --  3.0*    ------------------------------------------------------------------------------------------------------------------ No results for input(s): CHOL, HDL, LDLCALC, TRIG, CHOLHDL, LDLDIRECT in the last 72 hours.  Lab Results  Component Value Date   HGBA1C 5.5 03/17/2021    ------------------------------------------------------------------------------------------------------------------ No results for input(s): TSH, T4TOTAL, T3FREE, THYROIDAB in the last 72 hours.  Invalid input(s): FREET3  Cardiac Enzymes No results for input(s): CKMB, TROPONINI, MYOGLOBIN in the last 168 hours.  Invalid input(s): CK ------------------------------------------------------------------------------------------------------------------ No results found for: BNP  CBG: No results for input(s): GLUCAP in the last 168 hours.  Recent Results (from the past 240 hours)  Surgical pcr screen     Status: None   Collection Time: 04/09/24  8:03 AM   Specimen: Nasal Mucosa; Nasal Swab  Result Value Ref Range Status   MRSA, PCR NEGATIVE NEGATIVE Final   Staphylococcus aureus NEGATIVE NEGATIVE Final    Comment: (NOTE) The Xpert SA Assay (FDA approved for NASAL specimens in patients 41 years of age and older),  is one component of a comprehensive surveillance program. It is not intended to diagnose infection nor to guide or monitor treatment. Performed at [facility], [address], [place], [place] [address]      Radiology Studies: No results found.    Nilda Fendt, MD, PhD [facility]  Between 7 am - 7 pm I am available, please contact me via Amion (for emergencies) or Securechat (non urgent messages)  Between 7 pm - 7 am I am not available, please contact night coverage MD/APP via Amion

## 2024-04-18 NOTE — Progress Notes (Signed)
 Physical Therapy Treatment Patient Details Name: Roger Pace MRN: [id number] DOB: 1970/08/30 Today's Date: 04/18/2024   History of Present Illness 53 y.o. male presents to [facility] hospital on 04/14/2024 for elective bilateral TKA. PMH includes umbilical hernia repair, OA.    PT Comments  Pt received in supine, A&O and agreeable to therapy session, spouse present and receptive to instruction. Noted order for bil TED hose but none in room, since previous session pt had symptomatic orthostatic hypotension, PTA assisted pt and spouse with education on TED hose donning and thigh-high TED hose placed, pt reports comfort with this length as it is not pushing too much on his incisions. Pt due for PO pain meds for RN and lab tech arriving to take blood cultures which took >10 mins, so defer EOB/OOB until after meds given time to kick in. Discussion on supine HEP including use of IS with pt demo back and pt iceman x2 refilled as ice had melted with education on frozen bottles in ice man for ease of use/refilling. Pt continues to benefit from PT services to progress toward functional mobility goals, PTA plan to return in ~1 hour once pain meds working to assess OOB/standing tolerance and discussion on likely progression of therapies pending tolerance, pt/spouse open to considering higher intensity post-acute rehab pending OOB mobility later today.    If plan is discharge home, recommend the following: A little help with walking and/or transfers;A little help with bathing/dressing/bathroom;Assistance with cooking/housework;Assist for transportation;Help with stairs or ramp for entrance   Can travel by private vehicle        Equipment Recommendations  Rolling walker (2 wheels);BSC/3in1    Recommendations for Other Services       Precautions / Restrictions Precautions Precautions: Fall;Knee Precaution Booklet Issued: Yes (comment) Recall of Precautions/Restrictions: Intact Precaution/Restrictions  Comments: Pt receptive to discussion on knee precs, use of iceman, agreeable to PTA assist with TED hose donning per order Required Braces or Orthoses: Knee Immobilizer - Left Knee Immobilizer - Right: Other (comment) (per Dr. Addie, try to alternate knee immobilizer between legs to allow each leg the chance to bear more of the load when mobilizing) Knee Immobilizer - Left: Other (comment) Restrictions Weight Bearing Restrictions Per Provider Order: Yes RLE Weight Bearing Per Provider Order: Weight bearing as tolerated LLE Weight Bearing Per Provider Order: Weight bearing as tolerated     Mobility  Bed Mobility Overal bed mobility: Needs Assistance             General bed mobility comments: lab arriving to take blood cultures-defer until later    Transfers Overall transfer level: Needs assistance                      Ambulation/Gait               General Gait Details: defer; lab arriving needing >10 mins to check cultures   Stairs             Wheelchair Mobility     Tilt Bed    Modified Rankin (Stroke Patients Only)       Balance Overall balance assessment: Needs assistance Sitting-balance support: No upper extremity supported, Feet supported Sitting balance-Leahy Scale: Good     Standing balance support: Reliant on assistive device for balance Standing balance-Leahy Scale: Poor                              Communication Communication  Communication: No apparent difficulties  Cognition Arousal: Alert Behavior During Therapy: WFL for tasks assessed/performed   PT - Cognitive impairments: No apparent impairments                       PT - Cognition Comments: pleasantly cooperative Following commands: Intact      Cueing Cueing Techniques: Verbal cues  Exercises Total Joint Exercises Ankle Circles/Pumps: AROM, Both, 20 reps, Supine (x2 sets of 10) Quad Sets:  (verbal cues to perform TID at least 10) Other  Exercises Other Exercises: IS x 5 reps cues for technique, pt achieves 1700 to 2200 depending on technique    General Comments General comments (skin integrity, edema, etc.): Pt and spouse instructed on TED hose placement after PTA measured limbs  (size XL thigh high regular length per measurement)      Pertinent Vitals/Pain Pain Assessment Pain Assessment: Faces Faces Pain Scale: Hurts little more Pain Location: knees at rest/with TED hose donning Pain Descriptors / Indicators: Discomfort Pain Intervention(s): Limited activity within patient's tolerance, Monitored during session, Premedicated before session, Repositioned, Ice applied    Home Living                          Prior Function            PT Goals (current goals can now be found in the care plan section) Acute Rehab PT Goals Patient Stated Goal: to return to independence PT Goal Formulation: With patient Time For Goal Achievement: 04/18/24 Progress towards PT goals: Progressing toward goals    Frequency    7X/week      PT Plan      Co-evaluation              AM-PAC PT 6 Clicks Mobility   Outcome Measure  Help needed turning from your back to your side while in a flat bed without using bedrails?: A Little Help needed moving from lying on your back to sitting on the side of a flat bed without using bedrails?: A Little Help needed moving to and from a bed to a chair (including a wheelchair)?: A Little Help needed standing up from a chair using your arms (e.g., wheelchair or bedside chair)?: A Lot Help needed to walk in hospital room?: A Lot Help needed climbing 3-5 steps with a railing? : Total 6 Click Score: 14    End of Session Equipment Utilized During Treatment: Gait belt;Other (comment) (TED hose donned during session (thigh high)) Activity Tolerance: Patient tolerated treatment well;Other (comment) (lab arriving) Patient left: in bed;with call bell/phone within reach;with  family/visitor present (spouse in room) Nurse Communication: Mobility status;Other (comment) (TED hose placed/iceman refilled) PT Visit Diagnosis: Other abnormalities of gait and mobility (R26.89);Muscle weakness (generalized) (M62.81);Pain Pain - Right/Left: Right Pain - part of body: Knee     Time: 1024-1040 PT Time Calculation (min) (ACUTE ONLY): 16 min  Charges:    $Therapeutic Activity: 8-22 mins PT General Charges $$ ACUTE PT VISIT: 1 Visit                     Evianna Chandran P., PTA Acute Rehabilitation Services Secure Chat Preferred 9a-5:30pm Office: [phone]    Connell HERO [facility] 04/18/2024, 10:59 AM

## 2024-04-19 ENCOUNTER — Inpatient Hospital Stay (HOSPITAL_COMMUNITY)

## 2024-04-19 DIAGNOSIS — R9431 Abnormal electrocardiogram [ECG] [EKG]: Secondary | ICD-10-CM | POA: Diagnosis not present

## 2024-04-19 DIAGNOSIS — Z96653 Presence of artificial knee joint, bilateral: Secondary | ICD-10-CM | POA: Diagnosis not present

## 2024-04-19 DIAGNOSIS — M1712 Unilateral primary osteoarthritis, left knee: Secondary | ICD-10-CM

## 2024-04-19 DIAGNOSIS — M1711 Unilateral primary osteoarthritis, right knee: Secondary | ICD-10-CM

## 2024-04-19 LAB — ECHOCARDIOGRAM COMPLETE
AR max vel: 3.48 cm2
AV Peak grad: 9.7 mmHg
Ao pk vel: 1.56 m/s
Area-P 1/2: 5.02 cm2
Height: 71.5 in
S' Lateral: 2.8 cm
Weight: 4160 [oz_av]

## 2024-04-19 MED ORDER — CALCIUM CARBONATE ANTACID 500 MG PO CHEW
1.0000 | CHEWABLE_TABLET | Freq: Three times a day (TID) | ORAL | Status: DC | PRN
  Administered 2024-04-19: 200 mg via ORAL
  Filled 2024-04-19: qty 1

## 2024-04-19 MED ORDER — SENNOSIDES-DOCUSATE SODIUM 8.6-50 MG PO TABS
2.0000 | ORAL_TABLET | Freq: Two times a day (BID) | ORAL | Status: DC
  Administered 2024-04-19 – 2024-04-21 (×6): 2 via ORAL
  Filled 2024-04-19 (×6): qty 2

## 2024-04-19 MED ORDER — POLYETHYLENE GLYCOL 3350 17 G PO PACK
17.0000 g | PACK | Freq: Two times a day (BID) | ORAL | Status: DC
  Administered 2024-04-19 – 2024-04-21 (×6): 17 g via ORAL
  Filled 2024-04-19 (×6): qty 1

## 2024-04-19 NOTE — Progress Notes (Signed)
 Physical Therapy Treatment  Patient Details Name: Roger Pace MRN: [id number] DOB: 07/05/1971 Today's Date: 04/19/2024   History of Present Illness 53 y.o. male presents to [facility] hospital on 04/14/2024 for elective bilateral TKA. Was found to have fevers and on further workup was found to have bilateral LE acute DVT. Pt limited due to symptomatic orthostatic hypotension 8/29 and 8/30 PT sessions. PMH includes umbilical hernia repair, OA.    PT Comments  Pt progressing towards physical therapy goals. BP pattern similar to this morning, and pt reports asymptomatic. Monitored HR throughout functional mobility. Highest recorded was 169 bpm during ambulation. Pt sweating and hot from activity but otherwise reports feeling ok. Will continue to follow and progress as able per POC.    If plan is discharge home, recommend the following: A little help with walking and/or transfers;A little help with bathing/dressing/bathroom;Assistance with cooking/housework;Assist for transportation;Help with stairs or ramp for entrance   Can travel by private vehicle        Equipment Recommendations  Rolling walker (2 wheels);BSC/3in1    Recommendations for Other Services Rehab consult;OT consult     Precautions / Restrictions Precautions Precautions: Fall;Knee Precaution Booklet Issued: Yes (comment) Recall of Precautions/Restrictions: Intact Required Braces or Orthoses: Knee Immobilizer - Left Knee Immobilizer - Right: Other (comment) (per Dr. Addie, try to alternate knee immobilizer between legs to allow each leg the chance to bear more of the load when mobilizing) Knee Immobilizer - Left: Other (comment) Restrictions Weight Bearing Restrictions Per Provider Order: Yes RLE Weight Bearing Per Provider Order: Weight bearing as tolerated LLE Weight Bearing Per Provider Order: Weight bearing as tolerated     Mobility  Bed Mobility Overal bed mobility: Needs Assistance Bed Mobility: Sit to Supine      Supine to sit: Contact guard, HOB elevated Sit to supine: Min assist, HOB elevated, Used rails   General bed mobility comments: VC's for sequencing. Assist for LE advancement towards EOB. Use of rails required for trunk elevation to full sitting position. Increased time to scoot out fully and get feet on the floor.    Transfers Overall transfer level: Needs assistance Equipment used: Rolling walker (2 wheels) Transfers: Sit to/from Stand Sit to Stand: +2 physical assistance, From elevated surface, Max assist   Step pivot transfers: Min assist, +2 safety/equipment       General transfer comment: Heavy +2 assist for power up to full stand. Increased time to gain/maintain standing balance.    Ambulation/Gait Ambulation/Gait assistance: Min assist, +2 safety/equipment, +2 physical assistance Gait Distance (Feet): 50 Feet Assistive device: Rolling walker (2 wheels) Gait Pattern/deviations: Step-to pattern, Shuffle, Trunk flexed, Antalgic, Step-through pattern, Decreased stride length Gait velocity: Decreased Gait velocity interpretation: <1.31 ft/sec, indicative of household ambulator   General Gait Details: Pt with HR up to 169 bpm but decreasing with standing rest breaks. RN aware. Pt able to progress gait distance with 2 standing rest breaks. 1 instance of L knee buckle requiring +2 assist to recover.   Stairs             Wheelchair Mobility     Tilt Bed    Modified Rankin (Stroke Patients Only)       Balance Overall balance assessment: Needs assistance Sitting-balance support: No upper extremity supported, Feet supported Sitting balance-Leahy Scale: Good     Standing balance support: Reliant on assistive device for balance Standing balance-Leahy Scale: Poor Standing balance comment: RW  Communication Communication Communication: No apparent difficulties  Cognition Arousal: Alert Behavior During Therapy: WFL for tasks  assessed/performed   PT - Cognitive impairments: No apparent impairments                       PT - Cognition Comments: pleasantly cooperative Following commands: Intact      Cueing Cueing Techniques: Verbal cues  Exercises Total Joint Exercises Ankle Circles/Pumps: 10 reps, Both Quad Sets: 10 reps, Both Heel Slides: 5 reps, AAROM, Both Hip ABduction/ADduction: 5 reps, AAROM, Both Knee Flexion: 10 reps, Seated, AROM, Both Goniometric ROM: R: 5-62, L: 12-64    General Comments        Pertinent Vitals/Pain Pain Assessment Pain Assessment: Faces Faces Pain Scale: Hurts even more Pain Location: BLE with knee flexion and STS Pain Descriptors / Indicators: Discomfort, Grimacing, Operative site guarding, Sharp Pain Intervention(s): Limited activity within patient's tolerance, Monitored during session, Repositioned    Home Living                          Prior Function            PT Goals (current goals can now be found in the care plan section) Acute Rehab PT Goals Patient Stated Goal: to return to independence PT Goal Formulation: With patient/family Time For Goal Achievement: 05/03/24 Potential to Achieve Goals: Good Progress towards PT goals: Progressing toward goals    Frequency    7X/week      PT Plan      Co-evaluation              AM-PAC PT 6 Clicks Mobility   Outcome Measure  Help needed turning from your back to your side while in a flat bed without using bedrails?: A Little Help needed moving from lying on your back to sitting on the side of a flat bed without using bedrails?: A Little (pt has adjustable HOB) Help needed moving to and from a bed to a chair (including a wheelchair)?: A Lot Help needed standing up from a chair using your arms (e.g., wheelchair or bedside chair)?: A Lot Help needed to walk in hospital room?: Total Help needed climbing 3-5 steps with a railing? : Total 6 Click Score: 12    End of  Session Equipment Utilized During Treatment: Gait belt;Right knee immobilizer Activity Tolerance: Patient tolerated treatment well Patient left: with family/visitor present;in chair;with call bell/phone within reach;with chair alarm set Nurse Communication: Mobility status;Precautions;Need for lift equipment PT Visit Diagnosis: Other abnormalities of gait and mobility (R26.89);Muscle weakness (generalized) (M62.81);Pain Pain - Right/Left: Right Pain - part of body: Knee     Time: 1341-1425 PT Time Calculation (min) (ACUTE ONLY): 44 min  Charges:    $Gait Training: 23-37 mins $Therapeutic Exercise: 8-22 mins PT General Charges $$ ACUTE PT VISIT: 1 Visit                     Leita Sable, PT, DPT Acute Rehabilitation Services Secure Chat Preferred Office: [phone]    Leita JONETTA Sable 04/19/2024, 3:09 PM

## 2024-04-19 NOTE — Plan of Care (Signed)
  Problem: Pain Managment: Goal: General experience of comfort will improve and/or be controlled Outcome: Progressing   Problem: Safety: Goal: Ability to remain free from injury will improve Outcome: Progressing

## 2024-04-19 NOTE — Progress Notes (Signed)

## 2024-04-19 NOTE — Evaluation (Addendum)
 Occupational Therapy Evaluation Patient Details Name: Roger Pace MRN: [id number] DOB: 08-25-1970 Today's Date: 04/19/2024   History of Present Illness   53 y.o. Roger presents to [facility] hospital on 04/14/2024 for elective bilateral TKA. Was found to have fevers and on further workup was found to have bilateral LE acute DVT. Pt limited due to symptomatic orthostatic hypotension 8/29 and 8/30 PT sessions. PMH includes umbilical hernia repair, OA.     Clinical Impressions At baseline, pt is Independent with ADLs, IADLs, and functional mobility without an AD. At baseline, pt works and drives. Pt now presents with decreased activity tolerance, impaired cardiopulmonary status, decreased balance, pain affecting functional level, decreased knowledge of AE/DME, and decreased safety and independence with functional tasks. Pt currently demonstrates ability to complete ADLs largely Independent to Max assist, bed mobility with Contact guard to Min assist, and functional transfers/mobility with a RW with Min assist +2 to Max assist +2. Pt participated well in session but was limited by increased HR up to 169 bpm during session and by pain. Pt also with elevated but stable BP throughout session. Pt is motivated to return to PLOF and has good family support. Pt will benefit from acute skilled OT services to address deficits outlined below and increase safety and independence with functional tasks. Post acute discharge, pt will benefit from intensive inpatient skilled rehab services > 3 hours per day to maximize rehab potential.      If plan is discharge home, recommend the following:   Two people to help with walking and/or transfers;Assistance with cooking/housework;A lot of help with bathing/dressing/bathroom;Assist for transportation;Help with stairs or ramp for entrance     Functional Status Assessment   Patient has had a recent decline in their functional status and demonstrates the ability to make  significant improvements in function in a reasonable and predictable amount of time.     Equipment Recommendations   Tub/shower seat;Other (comment) (AE for LB ADLs)     Recommendations for Other Services         Precautions/Restrictions   Precautions Precautions: Fall;Knee Precaution Booklet Issued: Yes (comment) Recall of Precautions/Restrictions: Intact Required Braces or Orthoses: Knee Immobilizer - Left Knee Immobilizer - Right: Other (comment) (per Dr. Addie, try to alternate knee immobilizer between legs to allow each leg the chance to bear more of the load when mobilizing) Knee Immobilizer - Left: Other (comment) (see above) Restrictions Weight Bearing Restrictions Per Provider Order: Yes RLE Weight Bearing Per Provider Order: Weight bearing as tolerated LLE Weight Bearing Per Provider Order: Weight bearing as tolerated     Mobility Bed Mobility Overal bed mobility: Needs Assistance Bed Mobility: Sit to Supine     Supine to sit: Contact guard, HOB elevated Sit to supine: Min assist, HOB elevated, Used rails   General bed mobility comments: VC's for sequencing. Assist for LE advancement towards EOB. Use of rails required for trunk elevation to full sitting position. Increased time to scoot out fully and get feet on the floor.    Transfers Overall transfer level: Needs assistance Equipment used: Rolling walker (2 wheels) Transfers: Sit to/from Stand, Bed to chair/wheelchair/BSC Sit to Stand: Max assist, +2 physical assistance, From elevated surface     Step pivot transfers: Min assist, +2 safety/equipment     General transfer comment: Heavy +2 assist for power up to full stand. Increased time to gain/maintain standing balance.      Balance Overall balance assessment: Needs assistance Sitting-balance support: No upper extremity supported, Feet supported Sitting  balance-Leahy Scale: Good     Standing balance support: Bilateral upper extremity  supported, During functional activity, Reliant on assistive device for balance Standing balance-Leahy Scale: Poor Standing balance comment: reliant on UE support                           ADL either performed or assessed with clinical judgement   ADL Overall ADL's : Needs assistance/impaired Eating/Feeding: Independent;Bed level   Grooming: Independent;Bed level   Upper Body Bathing: Set up;Bed level   Lower Body Bathing: Moderate assistance;Maximal assistance;Cueing for compensatory techniques;Bed level   Upper Body Dressing : Independent;Bed level   Lower Body Dressing: Moderate assistance;Maximal assistance;Bed level;Cueing for compensatory techniques   Toilet Transfer: Minimal assistance;Maximal assistance;+2 for physical assistance;Ambulation;BSC/3in1;Rolling walker (2 wheels) Toilet Transfer Details (indicate cue type and reason): simulated bed to chair; Max assist +2 to power up and Min assist +2 once in standing Toileting- Clothing Manipulation and Hygiene: Set up;Maximal assistance;Bed level Toileting - Clothing Manipulation Details (indicate cue type and reason): Set up for use of hand held urinal and Max assist for peri care from bed level     Functional mobility during ADLs: Minimal assistance;+2 for physical assistance;Rolling walker (2 wheels) General ADL Comments: Pt functional level limited by increased HR in sitting and standing this session.     Vision Patient Visual Report: No change from baseline Vision Assessment?: No apparent visual deficits Additional Comments: Vision [facility] for tasks assessed; not formally screeded or assessed     Perception         Praxis         Pertinent Vitals/Pain Pain Assessment Pain Assessment: Faces Faces Pain Scale: Hurts even more Pain Location: BLE with knee flexion and STS Pain Descriptors / Indicators: Discomfort, Grimacing, Operative site guarding, Sharp Pain Intervention(s): Limited activity within  patient's tolerance, Monitored during session, Repositioned, Premedicated before session     Extremity/Trunk Assessment Upper Extremity Assessment Upper Extremity Assessment: Right hand dominant;Overall [facility] for tasks assessed   Lower Extremity Assessment Lower Extremity Assessment: Defer to PT evaluation   Cervical / Trunk Assessment Cervical / Trunk Assessment: Normal   Communication Communication Communication: No apparent difficulties   Cognition Arousal: Alert Behavior During Therapy: WFL for tasks assessed/performed Cognition: No apparent impairments             OT - Cognition Comments: AAOx4 and pleasant throughout session.                 Following commands: Intact       Cueing  General Comments   Cueing Techniques: Verbal cues  Pt's wife present and supportive throughout session. Pt's daughter and her boyfriend arriving and supportive at end of session.   Exercises     Shoulder Instructions      Home Living Family/patient expects to be discharged to:: Private residence Living Arrangements: Spouse/significant other Available Help at Discharge: Family Type of Home: Apartment Home Access: Elevator;Level entry     Home Layout: One level     Bathroom Shower/Tub: Producer, television/film/video: Standard     Home Equipment: None   Additional Comments: has two grown children who live in other parts of the state      Prior Functioning/Environment Prior Level of Function : Independent/Modified Independent;Working/employed;Driving             Mobility Comments: Ind without an AD ADLs Comments: Ind with ADLs and IADLs; works for [employer];  drives; 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    OT Problem List: Decreased activity tolerance;Impaired balance (sitting and/or standing);Decreased knowledge of use of DME or AE;Cardiopulmonary status limiting activity;Pain   OT Treatment/Interventions: Self-care/ADL training;Energy  conservation;DME and/or AE instruction;Therapeutic activities;Patient/family education;Balance training      OT Goals(Current goals can be found in the care plan section)   Acute Rehab OT Goals Patient Stated Goal: to return home OT Goal Formulation: With patient Time For Goal Achievement: 05/03/24 Potential to Achieve Goals: Good ADL Goals Pt Will Perform Lower Body Bathing: with min assist;with adaptive equipment;sit to/from stand Pt Will Perform Lower Body Dressing: with min assist;with adaptive equipment;sit to/from stand Pt Will Transfer to Toilet: with contact guard assist;ambulating;regular height toilet;grab bars (with least restrictive AD) Pt Will Perform Toileting - Clothing Manipulation and hygiene: with min assist;sit to/from stand   OT Frequency:  Min 2X/week    Co-evaluation              AM-PAC OT 6 Clicks Daily Activity     Outcome Measure Help from another person eating meals?: None Help from another person taking care of personal grooming?: None Help from another person toileting, which includes using toliet, bedpan, or urinal?: A Lot Help from another person bathing (including washing, rinsing, drying)?: A Lot Help from another person to put on and taking off regular upper body clothing?: None Help from another person to put on and taking off regular lower body clothing?: A Lot 6 Click Score: 18   End of Session Equipment Utilized During Treatment: Gait belt;Rolling walker (2 wheels);Other (comment) (Knee Immobilizer) CPM Left Knee CPM Left Knee: Off CPM Right Knee CPM Right Knee: Off Nurse Communication: Mobility status  Activity Tolerance: Patient tolerated treatment well;Treatment limited secondary to medical complications (Comment);No increased pain (Pt limited by elevated HR in sitting and standing and elevated but stable BP throughout session.) Patient left: in chair;with call bell/phone within reach;with family/visitor present  OT Visit  Diagnosis: Other abnormalities of gait and mobility (R26.89);Unsteadiness on feet (R26.81);Pain                Time: 1341-1425 OT Time Calculation (min): 44 min Charges:  OT General Charges $OT Visit: 1 Visit OT Evaluation $OT Eval Moderate Complexity: 1 Mod  Margarie Rockey HERO., OTR/L, MA Acute Rehab [phone]   Margarie FORBES Horns 04/19/2024, 6:47 PM

## 2024-04-19 NOTE — Progress Notes (Signed)
  Subjective: Pt stable - echo in progress   Objective: Vital signs in last 24 hours: Temp:  [97.9 F (36.6 C)-100 F (37.8 C)] (P) 99.7 F (37.6 C) (08/31 1229) Pulse Rate:  [106-123] (P) 114 (08/31 1229) Resp:  [16-18] (P) 16 (08/31 1229) BP: (123-164)/(62-80) (P) 149/72 (08/31 1229) SpO2:  [96 %-100 %] (P) 99 % (08/31 1229)  Intake/Output from previous day: No intake/output data recorded. Intake/Output this shift: No intake/output data recorded.  Exam:  Intact pulses distally Dorsiflexion/Plantar flexion intact No cellulitis present  Labs: Recent Labs    04/16/24 1347 04/17/24 0750  HGB 11.9* 11.5*   Recent Labs    04/16/24 1347 04/17/24 0750  WBC 14.3* 14.4*  RBC 3.81* 3.69*  HCT 35.1* 33.7*  PLT 232 229   Recent Labs    04/16/24 1347 04/17/24 0750  NA 133* 134*  K 3.7 3.5  CL 99 95*  CO2 23 24  BUN 13 11  CREATININE 1.09 1.12  GLUCOSE 147* 138*  CALCIUM  8.9 8.9   No results for input(s): LABPT, INR in the last 72 hours.  Assessment/Plan: Echo pending Cir consult pending Mobilization improving slowly but he could do well with 1 week inpatient rehab CTA could rule in PE but xarelto  therapy would be the same except duration may change - per med team   [employer] 04/19/2024, 12:44 PM

## 2024-04-19 NOTE — Progress Notes (Signed)
 PROGRESS NOTE  Roger Pace FMW:[id number] DOB: 03-Jun-1971 DOA: 04/14/2024 PCP: Hughie Sharper, MD   LOS: 5 days   Brief Narrative / Interim history: 53 y.o. male with medical history significant of obesity, arthritis who presented for bilateral TKA surgery.  Was found to have fevers and on further workup was found to have acute DVT, hospitalist team consulted  Subjective / 24h Interval events: Feels well.  Afebrile overnight.  No chest pain, no shortness of breath  Assesement and Plan: Principal Problem:   S/P TKR (total knee replacement), bilateral Active Problems:   Class 2 obesity without serious comorbidity with body mass index (BMI) of 38.0 to 38.9 in adult   DVT (deep venous thrombosis) (HCC)   Tachycardia   Fever   Arthritis of right knee   Arthritis of left knee  Principal problem Acute DVT -DVT study showed bilateral acute DVTs, started on Xarelto , continue.  He needs a starter pack.  Fever curve improving.  Blood cultures obtained 8/30, no growth to date  Active problems Dizziness, orthostatic hypotension, sinus tachycardia-this may be multifactorial in this patient who has been mostly in bed over the last 5 days, is postoperative, is intermittently febrile, is receiving pain medications which all can decrease blood pressure.  He has been eating well but has not been drinking as many fluids.  Received both fluid bolus, no longer that hypotensive upon standing but still has tachycardia - Obtain 2D echo for completeness  Status post bilateral TKR-per primary, PT now recommends inpatient rehab and he may be a candidate.  Agree with that  Obesity, class II-BMI 35.7, he would benefit from weight loss  Scheduled Meds:  docusate sodium   100 mg Oral BID   gabapentin   200 mg Oral TID   Rivaroxaban   15 mg Oral BID WC   Followed by   NOREEN ON 05/07/2024] rivaroxaban   20 mg Oral Q supper   Continuous Infusions:  sodium chloride      lactated ringers  Stopped (04/14/24  1259)   PRN Meds:.acetaminophen , HYDROmorphone  (DILAUDID ) injection, menthol -cetylpyridinium **OR** phenol, methocarbamol  **OR** methocarbamol  (ROBAXIN ) injection, metoCLOPramide  **OR** metoCLOPramide  (REGLAN ) injection, metoprolol  tartrate, ondansetron , oxyCODONE   Current Outpatient Medications  Medication Instructions   acetaminophen  (TYLENOL ) 325-650 mg, Oral, Every 6 hours PRN   Ascorbic Acid (VITAMIN C PO) 1 tablet, Daily at bedtime   b complex vitamins capsule 1 capsule, Daily at bedtime   celecoxib  (CELEBREX ) 200 mg, Oral, 2 times daily PRN   Cholecalciferol (VITAMIN D -3 PO) 1 tablet, Daily at bedtime   Collagen-Vitamin C-Biotin (COLLAGEN PO) 1 capsule, Daily at bedtime   CREATINE PO 1 Scoop, Daily at bedtime   docusate sodium  (COLACE) 100 mg, Oral, 2 times daily   gabapentin  (NEURONTIN ) 200 mg, Oral, 3 times daily   MAGNESIUM  PO 1 capsule, Daily at bedtime   methocarbamol  (ROBAXIN ) 500 mg, Oral, Every 6 hours PRN   oxyCODONE  (OXY IR/ROXICODONE ) 5-10 mg, Oral, Every 4 hours PRN   POTASSIUM PO 1 capsule, Daily at bedtime   RIVAROXABAN  (XARELTO ) VTE STARTER PACK (15 & 20 MG) Follow package directions: Take one 15mg  tablet by mouth twice a day. On day 22, switch to one 20mg  tablet once a day. Take with food.    Diet Orders (From admission, onward)     Start     Ordered   04/17/24 0000  Diet - low sodium heart healthy        04/17/24 0756   04/14/24 1537  Diet regular Room service appropriate? Yes; Fluid consistency:  Thin  Diet effective now       Question Answer Comment  Room service appropriate? Yes   Fluid consistency: Thin      04/14/24 1536            DVT prophylaxis: SCDs Start: 04/14/24 1537 Place TED hose Start: 04/14/24 1537 Rivaroxaban  (XARELTO ) tablet 15 mg  rivaroxaban  (XARELTO ) tablet 20 mg   Lab Results  Component Value Date   PLT 229 04/17/2024      Code Status: Full Code  Family Communication: Wife at bedside  Level of care:  Med-Surg  Objective: Vitals:   04/18/24 1656 04/18/24 2030 04/19/24 0125 04/19/24 0434  BP: 128/68 126/62 130/62 137/63  Pulse: (!) 107 (!) 123 (!) 109 (!) 106  Resp: 18   16  Temp: 98.3 F (36.8 C) 98.8 F (37.1 C)  97.9 F (36.6 C)  TempSrc: Oral Oral  Oral  SpO2: 100% 96%  100%  Weight:      Height:        Intake/Output Summary (Last 24 hours) at 04/19/2024 1007 Last data filed at 04/18/2024 1500 Gross per 24 hour  Intake 0 ml  Output --  Net 0 ml   Wt Readings from Last 3 Encounters:  04/14/24 117.9 kg  04/09/24 121.1 kg  10/23/23 123.1 kg    Examination:  Constitutional: NAD Eyes: lids and conjunctivae normal, no scleral icterus ENMT: mmm Neck: normal, supple Respiratory: clear to auscultation bilaterally, no wheezing, no crackles. Normal respiratory effort.  Cardiovascular: Regular rate and rhythm, no murmurs / rubs / gallops. No LE edema. Abdomen: soft, no distention, no tenderness. Bowel sounds positive.    Data Reviewed: I have independently reviewed following labs and imaging studies   CBC Recent Labs  Lab 04/16/24 1347 04/17/24 0750  WBC 14.3* 14.4*  HGB 11.9* 11.5*  HCT 35.1* 33.7*  PLT 232 229  MCV 92.1 91.3  MCH 31.2 31.2  MCHC 33.9 34.1  RDW 13.3 13.1  LYMPHSABS  --  2.5  MONOABS  --  1.5*  EOSABS  --  0.0  BASOSABS  --  0.0    Recent Labs  Lab 04/16/24 1347 04/17/24 0750  NA 133* 134*  K 3.7 3.5  CL 99 95*  CO2 23 24  GLUCOSE 147* 138*  BUN 13 11  CREATININE 1.09 1.12  CALCIUM  8.9 8.9  AST  --  40  ALT  --  18  ALKPHOS  --  33*  BILITOT  --  1.2  ALBUMIN  --  3.0*    ------------------------------------------------------------------------------------------------------------------ No results for input(s): CHOL, HDL, LDLCALC, TRIG, CHOLHDL, LDLDIRECT in the last 72 hours.  Lab Results  Component Value Date   HGBA1C 5.5 03/17/2021    ------------------------------------------------------------------------------------------------------------------ No results for input(s): TSH, T4TOTAL, T3FREE, THYROIDAB in the last 72 hours.  Invalid input(s): FREET3  Cardiac Enzymes No results for input(s): CKMB, TROPONINI, MYOGLOBIN in the last 168 hours.  Invalid input(s): CK ------------------------------------------------------------------------------------------------------------------ No results found for: BNP  CBG: No results for input(s): GLUCAP in the last 168 hours.  Recent Results (from the past 240 hours)  Culture, blood (Routine X 2) w Reflex to ID Panel     Status: None (Preliminary result)   Collection Time: 04/18/24 10:40 AM   Specimen: BLOOD LEFT ARM  Result Value Ref Range Status   Specimen Description BLOOD LEFT ARM  Final   Special Requests   Final    BOTTLES DRAWN AEROBIC AND ANAEROBIC Blood Culture results may not be  optimal due to an inadequate volume of blood received in culture bottles   Culture   Final    NO GROWTH < 24 HOURS Performed at [facility], [address], [place], [place] [address]    Report Status PENDING  Incomplete  Culture, blood (Routine X 2) w Reflex to ID Panel     Status: None (Preliminary result)   Collection Time: 04/18/24 10:53 AM   Specimen: BLOOD LEFT ARM  Result Value Ref Range Status   Specimen Description BLOOD LEFT ARM  Final   Special Requests   Final    BOTTLES DRAWN AEROBIC AND ANAEROBIC Blood Culture results may not be optimal due to an inadequate volume of blood received in culture bottles   Culture   Final    NO GROWTH < 24 HOURS Performed at [facility], [address] [place], [place] [address]    Report Status PENDING  Incomplete     Radiology Studies: No results found.    Nilda Fendt, MD, PhD [facility]  Between 7 am - 7 pm I am available, please contact me via Amion (for emergencies) or Securechat  (non urgent messages)  Between 7 pm - 7 am I am not available, please contact night coverage MD/APP via Amion

## 2024-04-19 NOTE — Progress Notes (Signed)
 Subjective: 5 Days Post-Op Procedure(s) (LRB): ARTHROPLASTY, KNEE, BILATERAL, TOTAL (Bilateral) REMOVAL, HARDWARE (Right) Patient reports pain as moderate.  Remains tachycardic.  Tells me he initially felt sob when first diagnosed with bilateral dvt.  Currently without chest pain/sob.  Awaiting 2D echo.  Has been afebrile and orthostatic hypotension appears to be improving.  Blood cx negative.    Objective: Vital signs in last 24 hours: Temp:  [97.9 F (36.6 C)-100 F (37.8 C)] 97.9 F (36.6 C) (08/31 0434) Pulse Rate:  [106-123] 106 (08/31 0434) Resp:  [16-18] 16 (08/31 0434) BP: (123-164)/(62-80) 137/63 (08/31 0434) SpO2:  [96 %-100 %] 100 % (08/31 0434)  Intake/Output from previous day: No intake/output data recorded. Intake/Output this shift: No intake/output data recorded.  Recent Labs    04/16/24 1347 04/17/24 0750  HGB 11.9* 11.5*   Recent Labs    04/16/24 1347 04/17/24 0750  WBC 14.3* 14.4*  RBC 3.81* 3.69*  HCT 35.1* 33.7*  PLT 232 229   Recent Labs    04/16/24 1347 04/17/24 0750  NA 133* 134*  K 3.7 3.5  CL 99 95*  CO2 23 24  BUN 13 11  CREATININE 1.09 1.12  GLUCOSE 147* 138*  CALCIUM  8.9 8.9   No results for input(s): LABPT, INR in the last 72 hours.  Neurologically intact Neurovascular intact Sensation intact distally Intact pulses distally Dorsiflexion/Plantar flexion intact Incision: dressing C/D/I No cellulitis present Compartment soft   Assessment/Plan: 5 Days Post-Op Procedure(s) (LRB): ARTHROPLASTY, KNEE, BILATERAL, TOTAL (Bilateral) REMOVAL, HARDWARE (Right) Advance diet Up with therapy WBAT BLE Bilateral DVT- continue xarelto /ted hose.  I think it would be beneficial to order a CTA as patient remains tachycardic.  I have called the hospitalist who saw patient this am, but unable to reach as vm has not been set up.  Will defer to medicine service D/c dispo- home with hhpt vs CIR       Ronal LITTIE Grave 04/19/2024, 11:02  AM

## 2024-04-19 NOTE — Progress Notes (Signed)
 Echocardiogram 2D Echocardiogram has been performed.  Roger Pace 04/19/2024, 2:02 PM

## 2024-04-19 NOTE — Progress Notes (Signed)
 Physical Therapy Treatment Patient Details Name: Roger Pace MRN: [id number] DOB: 06-Mar-1971 Today's Date: 04/19/2024   History of Present Illness 53 y.o. male presents to [facility] hospital on 04/14/2024 for elective bilateral TKA. Was found to have fevers and on further workup was found to have bilateral LE acute DVT. Pt limited due to symptomatic orthostatic hypotension 8/29 and 8/30 PT sessions. PMH includes umbilical hernia repair, OA.    PT Comments  Focus of session was to progress OOB mobility with regard for BP. See below for details. Pt reports feeling less symptomatic this date, however still becoming somewhat diaphoretic with standing activity. Increased time and care taken for optimal positioning of B knees in chair, with ice man applied. Will continue to follow and progress as able per POC.   Orthostatic BPs  Supine HOB 59 145/78 115 bpm  Sitting  152/69 133 bpm  Standing 106/69 149 bpm  Sitting end of session 126/75 140 bpm       If plan is discharge home, recommend the following: A little help with walking and/or transfers;A little help with bathing/dressing/bathroom;Assistance with cooking/housework;Assist for transportation;Help with stairs or ramp for entrance   Can travel by private vehicle        Equipment Recommendations  Rolling walker (2 wheels);BSC/3in1    Recommendations for Other Services Rehab consult;OT consult     Precautions / Restrictions Precautions Precautions: Fall;Knee Precaution Booklet Issued: Yes (comment) Recall of Precautions/Restrictions: Intact Required Braces or Orthoses: Knee Immobilizer - Left Knee Immobilizer - Right: Other (comment) (per Dr. Addie, try to alternate knee immobilizer between legs to allow each leg the chance to bear more of the load when mobilizing) Knee Immobilizer - Left: Other (comment) Restrictions Weight Bearing Restrictions Per Provider Order: Yes RLE Weight Bearing Per Provider Order: Weight bearing as  tolerated LLE Weight Bearing Per Provider Order: Weight bearing as tolerated     Mobility  Bed Mobility Overal bed mobility: Needs Assistance Bed Mobility: Sit to Supine     Supine to sit: Contact guard, HOB elevated Sit to supine: Min assist, HOB elevated, Used rails   General bed mobility comments: VC's for sequencing. Assist for LE advancement towards EOB. Use of rails required for trunk elevation to full sitting position. Increased time to scoot out fully and get feet on the floor.    Transfers Overall transfer level: Needs assistance Equipment used: Ambulation equipment used Transfers: Sit to/from Stand, Bed to chair/wheelchair/BSC Sit to Stand: +2 physical assistance, From elevated surface, Max assist   Step pivot transfers: Min assist, +2 safety/equipment       General transfer comment: Heavy +2 assist for power up to full stand. Increased time to gain/maintain standing balance.    Ambulation/Gait               General Gait Details: Deferred ambulation and opted for transfer to chair to continue with therapeutic exercise due to increased HR at rest (133 bpm EOB up to 149 bpm standing EOB)   Stairs             Wheelchair Mobility     Tilt Bed    Modified Rankin (Stroke Patients Only)       Balance Overall balance assessment: Needs assistance Sitting-balance support: No upper extremity supported, Feet supported Sitting balance-Leahy Scale: Good     Standing balance support: Reliant on assistive device for balance Standing balance-Leahy Scale: Poor  Communication Communication Communication: No apparent difficulties  Cognition Arousal: Alert Behavior During Therapy: WFL for tasks assessed/performed   PT - Cognitive impairments: No apparent impairments                       PT - Cognition Comments: pleasantly cooperative Following commands: Intact      Cueing Cueing Techniques: Verbal  cues  Exercises Total Joint Exercises Ankle Circles/Pumps: 10 reps, Both Quad Sets: 10 reps, Both Heel Slides: 5 reps, AAROM, Both Hip ABduction/ADduction: 5 reps, AAROM, Both Knee Flexion: 10 reps, Seated, AROM, Both Goniometric ROM: R: 5-62, L: 12-64    General Comments        Pertinent Vitals/Pain Pain Assessment Pain Assessment: Faces Faces Pain Scale: Hurts whole lot Pain Location: BLE with knee flexion and STS Pain Descriptors / Indicators: Discomfort, Grimacing, Operative site guarding, Sharp Pain Intervention(s): Limited activity within patient's tolerance, Monitored during session, Repositioned    Home Living                          Prior Function            PT Goals (current goals can now be found in the care plan section) Acute Rehab PT Goals Patient Stated Goal: to return to independence PT Goal Formulation: With patient/family Time For Goal Achievement: 05/03/24 Potential to Achieve Goals: Good Progress towards PT goals: Progressing toward goals    Frequency    7X/week      PT Plan      Co-evaluation              AM-PAC PT 6 Clicks Mobility   Outcome Measure  Help needed turning from your back to your side while in a flat bed without using bedrails?: A Little Help needed moving from lying on your back to sitting on the side of a flat bed without using bedrails?: A Little (pt has adjustable HOB) Help needed moving to and from a bed to a chair (including a wheelchair)?: A Lot Help needed standing up from a chair using your arms (e.g., wheelchair or bedside chair)?: Total (max +2 assist) Help needed to walk in hospital room?: Total Help needed climbing 3-5 steps with a railing? : Total 6 Click Score: 11    End of Session Equipment Utilized During Treatment: Gait belt;Left knee immobilizer (TED hose (thigh high) intact) Activity Tolerance: Patient limited by pain;Treatment limited secondary to medical complications (Comment)  (tachy) Patient left: with call bell/phone within reach;with family/visitor present;in bed (spouse in room, pt heels floated; iceman x2 re-donned and refilled) Nurse Communication: Mobility status;Precautions;Need for lift equipment PT Visit Diagnosis: Other abnormalities of gait and mobility (R26.89);Muscle weakness (generalized) (M62.81);Pain Pain - Right/Left: Right Pain - part of body: Knee     Time: 9057-8967 PT Time Calculation (min) (ACUTE ONLY): 50 min  Charges:    $Gait Training: 23-37 mins $Therapeutic Exercise: 8-22 mins PT General Charges $$ ACUTE PT VISIT: 1 Visit                     Leita Sable, PT, DPT Acute Rehabilitation Services Secure Chat Preferred Office: [phone]    Leita JONETTA Sable 04/19/2024, 1:27 PM

## 2024-04-20 ENCOUNTER — Inpatient Hospital Stay (HOSPITAL_COMMUNITY)

## 2024-04-20 DIAGNOSIS — Z96653 Presence of artificial knee joint, bilateral: Secondary | ICD-10-CM | POA: Diagnosis not present

## 2024-04-20 LAB — BASIC METABOLIC PANEL WITH GFR
Anion gap: 12 (ref 5–15)
BUN: 18 mg/dL (ref 6–20)
CO2: 24 mmol/L (ref 22–32)
Calcium: 8.2 mg/dL — ABNORMAL LOW (ref 8.9–10.3)
Chloride: 92 mmol/L — ABNORMAL LOW (ref 98–111)
Creatinine, Ser: 1.15 mg/dL (ref 0.61–1.24)
GFR, Estimated: >60 mL/min (ref >60)
Glucose, Bld: 133 mg/dL — ABNORMAL HIGH (ref 70–99)
Potassium: 4 mmol/L (ref 3.5–5.1)
Sodium: 128 mmol/L — ABNORMAL LOW (ref 135–145)

## 2024-04-20 MED ORDER — SORBITOL 70 % SOLN
45.0000 mL | Freq: Once | Status: AC
  Administered 2024-04-20: 45 mL via ORAL
  Filled 2024-04-20: qty 60

## 2024-04-20 MED ORDER — IOHEXOL 350 MG/ML SOLN
75.0000 mL | Freq: Once | INTRAVENOUS | Status: AC | PRN
  Administered 2024-04-20: 75 mL via INTRAVENOUS

## 2024-04-20 NOTE — Progress Notes (Signed)
 Physical Therapy Treatment  Patient Details Name: Roger Pace MRN: [id number] DOB: March 10, 1971 Today's Date: 04/20/2024   History of Present Illness 53 y.o. male presents to [facility] hospital on 04/14/2024 for elective bilateral TKA. Was found to have fevers and on further workup was found to have bilateral LE acute DVT. Pt limited due to symptomatic orthostatic hypotension 8/29 and 8/30 PT sessions. PMH includes umbilical hernia repair, OA.    PT Comments  Pt progressing towards physical therapy goals. Pt still pending CT at time of session, and we opted for bed level exercise and working on AAROM of knees. Pt continues to require significant assist from therapist and with gait belt around foot to gain knee flexion in bed. Poor quad control and initiation of movement. Despite this, pt demonstrated an excellent rehab effort and remains positive and motivated to participate. Will continue to follow.    If plan is discharge home, recommend the following: A little help with walking and/or transfers;A little help with bathing/dressing/bathroom;Assistance with cooking/housework;Assist for transportation;Help with stairs or ramp for entrance   Can travel by private vehicle        Equipment Recommendations  Rolling walker (2 wheels);BSC/3in1    Recommendations for Other Services Rehab consult;OT consult     Precautions / Restrictions Precautions Precautions: Fall;Knee Precaution Booklet Issued: Yes (comment) Recall of Precautions/Restrictions: Intact Precaution/Restrictions Comments: Pt educated on need for knee extension at this time. Bed locked in extension, and education provided that NO pillow/roll/ice pack should be placed under the knee. Required Braces or Orthoses: Knee Immobilizer - Left Knee Immobilizer - Right: Other (comment) (per Dr. Addie, try to alternate knee immobilizer between legs to allow each leg the chance to bear more of the load when mobilizing) Knee Immobilizer - Left:  Other (comment) (see above) Restrictions Weight Bearing Restrictions Per Provider Order: Yes RLE Weight Bearing Per Provider Order: Weight bearing as tolerated LLE Weight Bearing Per Provider Order: Weight bearing as tolerated     Mobility  Bed Mobility Overal bed mobility: Needs Assistance Bed Mobility: Sit to Supine, Supine to Sit     Supine to sit: HOB elevated, Min assist, Used rails Sit to supine: Used rails, Mod assist   General bed mobility comments: Assist for BLE advancement towards EOB. Pt with heavy use of UE's and rails for support. Increased time required for transition fully to EOB. Upon return to supine at end of session, pt required BLE elevation back up into bed. Pt able to use UE's at [facility] to reposition and straighten out.    Transfers Overall transfer level: Needs assistance Equipment used: Rolling walker (2 wheels) Transfers: Sit to/from Stand, Bed to chair/wheelchair/BSC Sit to Stand: Max assist, From elevated surface, Via lift equipment           General transfer comment: Heavy assist to power up to full stand from elevated surface and use of Stedy for support. Increased time to gain/maintain standing balance. Transfer via Lift Equipment: Stedy  Ambulation/Gait               General Gait Details: Did not advance to gait training this session. Focus was transfer training and exercise.   Stairs             Wheelchair Mobility     Tilt Bed    Modified Rankin (Stroke Patients Only)       Balance Overall balance assessment: Needs assistance Sitting-balance support: No upper extremity supported, Feet supported Sitting balance-Leahy Scale: Good  Standing balance support: Bilateral upper extremity supported, During functional activity, Reliant on assistive device for balance Standing balance-Leahy Scale: Poor Standing balance comment: reliant on UE support                            Communication  Communication Communication: No apparent difficulties  Cognition Arousal: Alert Behavior During Therapy: WFL for tasks assessed/performed   PT - Cognitive impairments: No apparent impairments                       PT - Cognition Comments: pleasantly cooperative Following commands: Intact      Cueing Cueing Techniques: Verbal cues  Exercises Total Joint Exercises Ankle Circles/Pumps: 10 reps, Both Quad Sets: 10 reps, Both Short Arc Quad: 15 reps, Both, AAROM Heel Slides: AAROM, Both, 10 reps Hip ABduction/ADduction: AAROM, Both, 10 reps Goniometric ROM: L 8-60; R 7-61 AAROM supine in bed Other Exercises Other Exercises: Standing terminal knee extension on R x10 with 5 hold    General Comments        Pertinent Vitals/Pain Pain Assessment Pain Assessment: Faces Faces Pain Scale: Hurts even more Pain Location: BLE with knee flexion and STS Pain Descriptors / Indicators: Discomfort, Grimacing, Operative site guarding, Sharp Pain Intervention(s): Limited activity within patient's tolerance, Monitored during session, Repositioned    Home Living                          Prior Function            PT Goals (current goals can now be found in the care plan section) Acute Rehab PT Goals Patient Stated Goal: to return to independence PT Goal Formulation: With patient/family Time For Goal Achievement: 05/03/24 Potential to Achieve Goals: Good Progress towards PT goals: Progressing toward goals    Frequency    7X/week      PT Plan      Co-evaluation              AM-PAC PT 6 Clicks Mobility   Outcome Measure  Help needed turning from your back to your side while in a flat bed without using bedrails?: A Little Help needed moving from lying on your back to sitting on the side of a flat bed without using bedrails?: A Little (pt has adjustable HOB) Help needed moving to and from a bed to a chair (including a wheelchair)?: A Lot Help needed  standing up from a chair using your arms (e.g., wheelchair or bedside chair)?: A Lot Help needed to walk in hospital room?: A Lot Help needed climbing 3-5 steps with a railing? : Total 6 Click Score: 13    End of Session Equipment Utilized During Treatment: Gait belt;Right knee immobilizer Activity Tolerance: Patient tolerated treatment well Patient left: with family/visitor present;in chair;with call bell/phone within reach;with chair alarm set Nurse Communication: Mobility status;Precautions;Need for lift equipment PT Visit Diagnosis: Other abnormalities of gait and mobility (R26.89);Muscle weakness (generalized) (M62.81);Pain Pain - Right/Left: Right Pain - part of body: Knee     Time: 8563-8493 PT Time Calculation (min) (ACUTE ONLY): 30 min  Charges:    $Therapeutic Exercise: 23-37 mins PT General Charges $$ ACUTE PT VISIT: 1 Visit                     Leita Sable, PT, DPT Acute Rehabilitation Services Secure Chat Preferred Office: [phone]    Leita  D Donny Heffern 04/20/2024, 3:39 PM

## 2024-04-20 NOTE — Consult Note (Signed)
 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   His initial plan was to go home afterwards and do therapy at home, however he's had multiple complications since admission with fevers, and extensive work up- he was diagnosed with multiple B/L LE DVT's a few days after surgery.   He's still having low grade fevers and last WBC was 14.4- on 8/29. He was also seen to have significant tachycardia- up to 169 yesterday with therapy and is running 115-120's at rest. Base don an ECHO yesterday, there is concern for R heart strain and a CTA was ordered for today to determine if has any Pe's.  He's been seen by OT and PT- he's min A of 2 for gait 4ft with RW, however max A of 2 sit to stand.  He's  ADLs are mod A for LB ADL's.   He reports pain 2-3/10 at rest due to CPM; and  6-7/10 after therapy even though he took pain meds prior. But he does feel pain meds are working- doesn't want to change.   However LBM was 7 days ago- they've given him stool softeners and Miralax - will start Sorbitol  to help him go.   Does report eating well even though decreased appetite.  Usually goes 1-3 days in between BM's at home.    Review of Systems  Constitutional:  Positive for malaise/fatigue.  HENT: Negative.  Negative for hearing loss.   Eyes: Negative.  Negative for blurred vision.  Respiratory:         Mild SOB, but cannot tell if due to pain?  Cardiovascular:        Tachycardia esp with therapy  Gastrointestinal:  Positive for constipation. Negative for abdominal pain and nausea.  Genitourinary:  Negative for dysuria, frequency and urgency.  Musculoskeletal:  Positive for joint pain and myalgias.  Skin: Negative.   Neurological:  Positive  for focal weakness and weakness. Negative for dizziness and sensory change.  Endo/Heme/Allergies: Negative.   Psychiatric/Behavioral: Negative.    All other systems reviewed and are negative.  Past Medical History:  Diagnosis Date   Arthritis    Past Surgical History:  Procedure Laterality Date   HARDWARE REMOVAL Right 04/14/2024   Procedure: REMOVAL, HARDWARE;  Surgeon: Addie Cordella Hamilton, MD;  Location: [facility] OR;  Service: Orthopedics;  Laterality: Right;   KNEE SURGERY Bilateral    arthroscopy on each knee, and reconstruction on right knee   LAPAROSCOPY N/A 09/18/2023   Procedure: DIAGNOSTIC LAPAROSCOPY;  Surgeon: Ebbie Cough, MD;  Location: [facility] OR;  Service: General;  Laterality: N/A;   SHOULDER SURGERY Left    repaired humerus (shoulder kept dislocating)   TOTAL KNEE ARTHROPLASTY Bilateral 04/14/2024   Procedure: ARTHROPLASTY, KNEE, BILATERAL, TOTAL;  Surgeon: Addie Cordella Hamilton, MD;  Location: [facility] OR;  Service: Orthopedics;  Laterality: Bilateral;   UMBILICAL HERNIA REPAIR N/A 09/18/2023   Procedure: UMBILICAL HERNIA REPAIR;  Surgeon: Ebbie Cough, MD;  Location: [facility] OR;  Service: General;  Laterality: N/A;  RNFA GEN & TAP BLOCK   Family History  Problem Relation Age of Onset   Hypertension Mother    Irregular heart beat Mother    Osteoarthritis Mother        Knees  Diabetes Father    Hypertension Father    Other Father        Elevated LFT   Prostate cancer Father    Cancer Father    Leukemia Sister    Cancer Sister    Arrhythmia Brother    Hyperlipidemia Brother    Hypertension Brother    Prostate cancer Maternal Uncle    Cancer Maternal Uncle    Prostate cancer Paternal Uncle    Cancer Paternal Uncle    Social History:  reports that he has never smoked. He has never used smokeless tobacco. He reports that he does not drink alcohol and does not use drugs. Allergies:  Allergies  Allergen Reactions   Shellfish Allergy Hives   Medications Prior to  Admission  Medication Sig Dispense Refill   Ascorbic Acid (VITAMIN C PO) Take 1 tablet by mouth at bedtime.     b complex vitamins capsule Take 1 capsule by mouth at bedtime.     Cholecalciferol (VITAMIN D -3 PO) Take 1 tablet by mouth at bedtime.     Collagen-Vitamin C-Biotin (COLLAGEN PO) Take 1 capsule by mouth at bedtime.     CREATINE PO Take 1 Scoop by mouth at bedtime.     MAGNESIUM  PO Take 1 capsule by mouth at bedtime.     POTASSIUM PO Take 1 capsule by mouth at bedtime.     [DISCONTINUED] celecoxib  (CELEBREX ) 200 MG capsule Take 1 capsule (200 mg total) by mouth 2 (two) times daily as needed. (Patient taking differently: Take 400 mg by mouth in the morning and at bedtime.) 60 capsule 6    Home: Home Living Family/patient expects to be discharged to:: Private residence Living Arrangements: Spouse/significant other Available Help at Discharge: Family Type of Home: Apartment Home Access: Elevator, Level entry Home Layout: One level Bathroom Shower/Tub: Health visitor: Standard Home Equipment: None Additional Comments: has two grown children who live in other parts of the state  Functional History: Prior Function Prior Level of Function : Independent/Modified Independent, Working/employed, Driving Mobility Comments: Ind without an AD ADLs Comments: Ind with ADLs and IADLs; works for [employer]; drives; 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 Pt with heavy use of UE's and rails for support. Increased time required for transition fully to EOB. Upon return to supine at end of session, pt required BLE elevation back up into bed. Pt able to use UE's at [facility] to reposition and  straighten out. Transfers Overall transfer level: Needs assistance Equipment used: Rolling walker (2 wheels) Transfers: Sit to/from Stand, Bed to chair/wheelchair/BSC Sit to Stand: Max assist, From elevated surface, Via lift equipment Bed to/from chair/wheelchair/BSC transfer type:: Step pivot Step pivot transfers: Min assist, +2 safety/equipment Transfer via Lift Equipment: Stedy General transfer comment: Heavy assist to power up to full stand from elevated surface and use of Stedy for support. Increased time to gain/maintain standing balance. Ambulation/Gait Ambulation/Gait assistance: Min assist, +2 safety/equipment, +2 physical assistance Gait Distance (Feet): 50 Feet Assistive device: Rolling walker (2 wheels) Gait Pattern/deviations: Step-to pattern, Shuffle, Trunk flexed, Antalgic, Step-through pattern, Decreased stride length General Gait Details: Did not advance to gait training this session. Focus was transfer training and exercise. Gait velocity: Decreased Gait velocity interpretation: <1.31 ft/sec,  indicative of household ambulator Pre-gait activities: standing weight shift to L/R sides in [place] platform a few reps but not able to take steps 2/2 orthostatic hypotension symptoms    ADL: ADL Overall ADL's : Needs assistance/impaired Eating/Feeding: Independent, Bed level Grooming: Independent, Bed level Upper Body Bathing: Set up, Bed level Lower Body Bathing: Moderate assistance, Maximal assistance, Cueing for compensatory techniques, Bed level Upper Body Dressing : Independent, Bed level Lower Body Dressing: Moderate assistance, Maximal assistance, Bed level, Cueing for compensatory techniques Toilet Transfer: Minimal assistance, Maximal assistance, +2 for physical assistance, Ambulation, BSC/3in1, Rolling walker (2 wheels) Toilet Transfer Details (indicate cue type and reason): simulated bed to chair; Max assist +2 to power up and Min assist +2 once in standing Toileting-  Clothing Manipulation and Hygiene: Set up, Maximal assistance, Bed level Toileting - Clothing Manipulation Details (indicate cue type and reason): Set up for use of hand held urinal and Max assist for peri care from bed level Functional mobility during ADLs: Minimal assistance, +2 for physical assistance, Rolling walker (2 wheels) General ADL Comments: Pt functional level limited by increased HR in sitting and standing this session.  Cognition: Cognition Orientation Level: Oriented X4 Cognition Arousal: Alert Behavior During Therapy: WFL for tasks assessed/performed  Blood pressure (!) 151/59, pulse (!) 112, temperature 99.9 F (37.7 C), temperature source Oral, resp. rate 18, height 5' 11.5 (1.816 m), weight 117.9 kg, SpO2 96%. Physical Exam Vitals and nursing note reviewed.  Constitutional:      Appearance: Normal appearance. He is obese.     Comments: Pt awake, alert, appropriate, sitting up in bed; muscular build overall, NAD  HENT:     Head: Normocephalic and atraumatic.     Right Ear: External ear normal.     Left Ear: External ear normal.     Nose: Nose normal. No congestion.     Mouth/Throat:     Mouth: Mucous membranes are moist.     Pharynx: Oropharynx is clear. No oropharyngeal exudate.  Eyes:     General:        Right eye: No discharge.        Left eye: No discharge.     Extraocular Movements: Extraocular movements intact.  Cardiovascular:     Rate and Rhythm: Regular rhythm. Tachycardia present.     Heart sounds: Normal heart sounds. No murmur heard.    No gallop.  Pulmonary:     Effort: Pulmonary effort is normal. No respiratory distress.     Breath sounds: Normal breath sounds. No wheezing, rhonchi or rales.  Abdominal:     Comments: Protuberant vs distended moderately; hypoactive BS; soft; NT  Musculoskeletal:     Cervical back: Neck supple. No tenderness.     Comments: Ue's 5/5 in B/L arms- throughout LEs HF 2-/5 limited by pain; DF/PF 5/5 B/L- couldn't  tolerate a lot of ROM of knees B/L- not on CPM currently Has cooling leg sleeves in place B/L  Skin:    General: Skin is warm and dry.     Comments: B/L knee incisions covered IV's in B/L forearms- look OK  Neurological:     Mental Status: He is alert.     Comments: Intact to light touch in all 4 extremities Ox3 intact cognition  Psychiatric:        Mood and Affect: Mood normal.        Behavior: Behavior normal.     No results found for this or any previous visit (from the past 24 hours).  ECHOCARDIOGRAM COMPLETE Result Date: 04/19/2024    ECHOCARDIOGRAM REPORT   Patient Name:   MAURIO BAIZE Date of Exam: 04/19/2024 Medical Rec #:  [id number]         Height:       71.5 in Accession #:    [id number]        Weight:       260.0 lb Date of Birth:  1971-05-01         BSA:          2.370 m Patient Age:    52 years          BP:           137/63 mmHg Patient Gender: M                 HR:           117 bpm. Exam Location:  Inpatient Procedure: 2D Echo, Cardiac Doppler and Color Doppler (Both Spectral and Color            Flow Doppler were utilized during procedure). Indications:    Abnormal ECG R94.31  History:        Patient has no prior history of Echocardiogram examinations.                 Arrythmias:Tachycardia.  Sonographer:    Thea Norlander RCS Referring Phys: [phone] NILDA HERO GHERGHE IMPRESSIONS  1. Left ventricular ejection fraction, by estimation, is 65 to 70%. The left ventricle has normal function. The left ventricle has no regional wall motion abnormalities. Left ventricular diastolic parameters were normal.  2. Right ventricular systolic function is hyperdynamic. The right ventricular size is mildly enlarged.  3. The mitral valve is normal in structure. No evidence of mitral valve regurgitation. No evidence of mitral stenosis.  4. The aortic valve is tricuspid. Aortic valve regurgitation is not visualized. No aortic stenosis is present.  5. Aortic dilatation noted. There is mild dilatation  of the aortic root, measuring 40 mm.  6. The inferior vena cava is normal in size with greater than 50% respiratory variability, suggesting right atrial pressure of 3 mmHg. Comparison(s): No prior Echocardiogram. FINDINGS  Left Ventricle: Left ventricular ejection fraction, by estimation, is 65 to 70%. The left ventricle has normal function. The left ventricle has no regional wall motion abnormalities. The left ventricular internal cavity size was normal in size. There is  no left ventricular hypertrophy. Left ventricular diastolic parameters were normal. Right Ventricle: The right ventricular size is mildly enlarged. No increase in right ventricular wall thickness. Right ventricular systolic function is hyperdynamic. Left Atrium: Left atrial size was normal in size. Right Atrium: Right atrial size was normal in size. Pericardium: There is no evidence of pericardial effusion. Mitral Valve: The mitral valve is normal in structure. Mild mitral annular calcification. No evidence of mitral valve regurgitation. No evidence of mitral valve stenosis. Tricuspid Valve: The tricuspid valve is normal in structure. Tricuspid valve regurgitation is not demonstrated. No evidence of tricuspid stenosis. Aortic Valve: The aortic valve is tricuspid. Aortic valve regurgitation is not visualized. No aortic stenosis is present. Aortic valve peak gradient measures 9.7 mmHg. Pulmonic Valve: The pulmonic valve was normal in structure. Pulmonic valve regurgitation is not visualized. No evidence of pulmonic stenosis. Aorta: Aortic dilatation noted. There is mild dilatation of the aortic root, measuring 40 mm. Venous: The inferior vena cava is normal in size with greater than 50% respiratory variability, suggesting right atrial pressure of 3 mmHg. IAS/Shunts:  The interatrial septum appears to be lipomatous. No atrial level shunt detected by color flow Doppler.  LEFT VENTRICLE PLAX 2D LVIDd:         4.40 cm   Diastology LVIDs:         2.80 cm    LV e' medial:    10.10 cm/s LV PW:         1.10 cm   LV E/e' medial:  7.8 LV IVS:        1.00 cm   LV e' lateral:   16.90 cm/s LVOT diam:     2.40 cm   LV E/e' lateral: 4.7 LV SV:         70 LV SV Index:   29 LVOT Area:     4.52 cm  LEFT ATRIUM           Index        RIGHT ATRIUM           Index LA diam:      3.50 cm 1.48 cm/m   RA Area:     10.90 cm LA Vol (A2C): 41.4 ml 17.47 ml/m  RA Volume:   17.30 ml  7.30 ml/m LA Vol (A4C): 36.7 ml 15.49 ml/m  AORTIC VALVE AV Area (Vmax): 3.48 cm AV Vmax:        156.00 cm/s AV Peak Grad:   9.7 mmHg LVOT Vmax:      120.00 cm/s LVOT Vmean:     74.600 cm/s LVOT VTI:       0.154 m  AORTA Ao Root diam: 4.00 cm Ao Asc diam:  3.60 cm MITRAL VALVE MV Area (PHT): 5.02 cm    SHUNTS MV Decel Time: 151 msec    Systemic VTI:  0.15 m MV E velocity: 78.60 cm/s  Systemic Diam: 2.40 cm MV A velocity: 95.40 cm/s MV E/A ratio:  0.82 Stanly Leavens MD Electronically signed by Stanly Leavens MD Signature Date/Time: 04/19/2024/2:25:06 PM    Final      Assessment/Plan: 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 Yes Co-Morbidities requiring supervision/potential complications: Tachycardia into 110s to 120s at rest- up to 170 with therapy; R heart strain- likely PE-s pending CTA; severe constipation Due to bowel management, safety, skin/wound care, disease management, medication administration, pain management, and patient education, does the patient require 24 hr/day rehab nursing? Yes Does the patient require coordinated care of a physician, rehab nurse, therapy disciplines of PT and OT to address physical and functional deficits in the context of the above medical diagnosis(es)? Yes Addressing deficits in the following areas: balance, endurance, locomotion, strength,  transferring, bowel/bladder control, bathing, dressing, feeding, grooming, and toileting Can the patient actively participate in an intensive therapy program of at least 3 hrs of therapy per day at least 5 days per week? Yes The potential for patient to make measurable gains while on inpatient rehab is good Anticipated functional outcomes upon discharge from inpatient rehab are modified independent and supervision  with PT, modified independent and supervision with OT, n/a with SLP. Estimated rehab length of stay to reach the above functional goals is: 7-10 days at most Anticipated discharge destination: Home Overall Rehab/Functional Prognosis: good  RECOMMENDATIONS: This patient's condition is appropriate for continued rehabilitative care in the following setting: CIR due  to having Severe tachycardia, probable Pe's, B/L LE DVT's as well as his admitted issues of B/L TKR's Patient has agreed to participate in recommended program. Yes Note that insurance prior authorization may be required for reimbursement for recommended care.  Comment:  Pt's LBM was 7 days ago- ordered Sorbitol  45cc at 4pm today so pt could have a BM.  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 Esp in setting of B/L DVTs in LE's.  Patient feels Pain regimen is working for him- but might need to decrease if cannot have BM.  Appropriately on Xarelto - con't per primary team.  Spoke to admissions coordinator and pt's nurse about issues and hopeful admission Thank you for this consult   I spent a total of  62  minutes on total care today- >50% coordination of care- due to  D/w admissions coordinator, pt's nurse, pt; review of chart, exam and documentation of consult.   Marquavius Scaife, MD 04/20/2024

## 2024-04-20 NOTE — Progress Notes (Signed)
 Orthopedic Tech Progress Note Patient Details:  Roger Pace 1970/09/09 [id number]  Patient ID: Roger Pace, male   DOB: [birth year], 53 y.o.   MRN: [id number] I replaced cpm machine because it stopped working. Roger Pace 04/20/2024, 3:51 AM

## 2024-04-20 NOTE — Progress Notes (Signed)
 Physical Therapy Treatment  Patient Details Name: Roger Pace MRN: [id number] DOB: 1971-06-03 Today's Date: 04/20/2024   History of Present Illness 53 y.o. male presents to [facility] hospital on 04/14/2024 for elective bilateral TKA. Was found to have fevers and on further workup was found to have bilateral LE acute DVT. Pt limited due to symptomatic orthostatic hypotension 8/29 and 8/30 PT sessions. PMH includes umbilical hernia repair, OA.    PT Comments  Pt progressing slowly towards physical therapy goals. Max effort was provided by pt and therapist this session for transfer training from elevated surface. Pt pulling to stand from center bar of Stedy, however had consistent difficulty activating knee and hip extensors to power up to full stand. Pt continues to have limited AROM of knees, which is a barrier to advancing sit<>stand transfers. Pt anticipates CT today. Will see for second session as pt availability allows.     If plan is discharge home, recommend the following: A little help with walking and/or transfers;A little help with bathing/dressing/bathroom;Assistance with cooking/housework;Assist for transportation;Help with stairs or ramp for entrance   Can travel by private vehicle        Equipment Recommendations  Rolling walker (2 wheels);BSC/3in1    Recommendations for Other Services Rehab consult;OT consult     Precautions / Restrictions Precautions Precautions: Fall;Knee Precaution Booklet Issued: Yes (comment) Recall of Precautions/Restrictions: Intact Precaution/Restrictions Comments: Pt and wife educated on need for knee extension at this time. Bed locked in extension, and education provided that NO pillow/roll/ice pack should be placed under the knee. Required Braces or Orthoses: Knee Immobilizer - Left Knee Immobilizer - Right: Other (comment) (per Dr. Addie, try to alternate knee immobilizer between legs to allow each leg the chance to bear more of the load when  mobilizing) Knee Immobilizer - Left: Other (comment) (see above) Restrictions Weight Bearing Restrictions Per Provider Order: Yes RLE Weight Bearing Per Provider Order: Weight bearing as tolerated LLE Weight Bearing Per Provider Order: Weight bearing as tolerated     Mobility  Bed Mobility Overal bed mobility: Needs Assistance Bed Mobility: Sit to Supine, Supine to Sit     Supine to sit: HOB elevated, Min assist, Used rails Sit to supine: Used rails, Mod assist   General bed mobility comments: Assist for BLE advancement towards EOB. Pt with heavy use of UE's and rails for support. Increased time required for transition fully to EOB. Upon return to supine at end of session, pt required BLE elevation back up into bed. Pt able to use UE's at [facility] to reposition and straighten out.    Transfers Overall transfer level: Needs assistance Equipment used: Rolling walker (2 wheels) Transfers: Sit to/from Stand, Bed to chair/wheelchair/BSC Sit to Stand: Max assist, From elevated surface, Via lift equipment           General transfer comment: Heavy assist to power up to full stand from elevated surface and use of Stedy for support. Increased time to gain/maintain standing balance. Transfer via Lift Equipment: Stedy  Ambulation/Gait               General Gait Details: Did not advance to gait training this session. Focus was transfer training and exercise.   Stairs             Wheelchair Mobility     Tilt Bed    Modified Rankin (Stroke Patients Only)       Balance Overall balance assessment: Needs assistance Sitting-balance support: No upper extremity supported, Feet supported Sitting  balance-Leahy Scale: Good     Standing balance support: Bilateral upper extremity supported, During functional activity, Reliant on assistive device for balance Standing balance-Leahy Scale: Poor Standing balance comment: reliant on UE support                             Communication Communication Communication: No apparent difficulties  Cognition Arousal: Alert Behavior During Therapy: WFL for tasks assessed/performed   PT - Cognitive impairments: No apparent impairments                       PT - Cognition Comments: pleasantly cooperative Following commands: Intact      Cueing Cueing Techniques: Verbal cues  Exercises Total Joint Exercises Ankle Circles/Pumps: 10 reps, Both Short Arc Quad: 15 reps, Both, AAROM Heel Slides: AAROM, Both, 10 reps Other Exercises Other Exercises: Standing terminal knee extension on R x10 with 5 hold    General Comments        Pertinent Vitals/Pain Pain Assessment Pain Assessment: Faces Faces Pain Scale: Hurts even more Pain Location: BLE with knee flexion and STS Pain Descriptors / Indicators: Discomfort, Grimacing, Operative site guarding, Sharp Pain Intervention(s): Monitored during session, Limited activity within patient's tolerance, Repositioned    Home Living                          Prior Function            PT Goals (current goals can now be found in the care plan section) Acute Rehab PT Goals Patient Stated Goal: to return to independence PT Goal Formulation: With patient/family Time For Goal Achievement: 05/03/24 Potential to Achieve Goals: Good Progress towards PT goals: Progressing toward goals    Frequency    7X/week      PT Plan      Co-evaluation              AM-PAC PT 6 Clicks Mobility   Outcome Measure  Help needed turning from your back to your side while in a flat bed without using bedrails?: A Little Help needed moving from lying on your back to sitting on the side of a flat bed without using bedrails?: A Little (pt has adjustable HOB) Help needed moving to and from a bed to a chair (including a wheelchair)?: A Lot Help needed standing up from a chair using your arms (e.g., wheelchair or bedside chair)?: A Lot Help needed to walk in  hospital room?: A Lot Help needed climbing 3-5 steps with a railing? : Total 6 Click Score: 13    End of Session Equipment Utilized During Treatment: Gait belt;Right knee immobilizer Activity Tolerance: Patient tolerated treatment well Patient left: with family/visitor present;in chair;with call bell/phone within reach;with chair alarm set Nurse Communication: Mobility status;Precautions;Need for lift equipment PT Visit Diagnosis: Other abnormalities of gait and mobility (R26.89);Muscle weakness (generalized) (M62.81);Pain Pain - Right/Left: Right Pain - part of body: Knee     Time: 1010-1102 PT Time Calculation (min) (ACUTE ONLY): 52 min  Charges:    $Therapeutic Exercise: 8-22 mins $Therapeutic Activity: 23-37 mins PT General Charges $$ ACUTE PT VISIT: 1 Visit                     Leita Sable, PT, DPT Acute Rehabilitation Services Secure Chat Preferred Office: [phone]    Leita JONETTA Sable 04/20/2024, 12:39 PM

## 2024-04-20 NOTE — Progress Notes (Signed)
 PROGRESS NOTE  Roger Pace FMW:[id number] DOB: 1971-02-18 DOA: 04/14/2024 PCP: Hughie Sharper, MD   LOS: 6 days   Brief Narrative / Interim history: 53 y.o. male with medical history significant of obesity, arthritis who presented for bilateral TKA surgery.  Was found to have fevers and on further workup was found to have acute DVT, hospitalist team consulted  Subjective / 24h Interval events: Feels well, still with a temp last night but not as high.  No chest pain, no shortness of breath.  Assesement and Plan: Principal Problem:   S/P TKR (total knee replacement), bilateral Active Problems:   Class 2 obesity without serious comorbidity with body mass index (BMI) of 38.0 to 38.9 in adult   DVT (deep venous thrombosis) (HCC)   Tachycardia   Fever   Arthritis of right knee   Arthritis of left knee  Principal problem Acute DVT -DVT study showed bilateral acute DVTs, started on Xarelto , continue.  He needs a starter pack.  Fever curve improving.  Blood cultures obtained 8/30, no growth to date  Active problems Dizziness, orthostatic hypotension, sinus tachycardia-this may be multifactorial in this patient who has been mostly in bed over the last 5 days, is postoperative, is intermittently febrile, is receiving pain medications which all can decrease blood pressure.  He has been eating well but has not been drinking as many fluids.  Received both fluid bolus, no longer that hypotensive upon standing but still has tachycardia - 2D echocardiogram showed normal LVEF, however RV was hyperdynamic with mild enlargement.  Obtain CT angiogram to rule out a PE as it may cause this  Status post bilateral TKR-per primary, PT now recommends inpatient rehab and he may be a candidate.  Agree with that, pursue CIR when bed is ready  Obesity, class II-BMI 35.7, he would benefit from weight loss  Scheduled Meds:  gabapentin   200 mg Oral TID   polyethylene glycol  17 g Oral BID   Rivaroxaban   15  mg Oral BID WC   Followed by   NOREEN ON 05/07/2024] rivaroxaban   20 mg Oral Q supper   senna-docusate  2 tablet Oral BID   Continuous Infusions:  sodium chloride      lactated ringers  Stopped (04/14/24 1259)   PRN Meds:.acetaminophen , calcium  carbonate, HYDROmorphone  (DILAUDID ) injection, menthol -cetylpyridinium **OR** phenol, methocarbamol  **OR** methocarbamol  (ROBAXIN ) injection, metoCLOPramide  **OR** metoCLOPramide  (REGLAN ) injection, metoprolol  tartrate, ondansetron , oxyCODONE   Current Outpatient Medications  Medication Instructions   acetaminophen  (TYLENOL ) 325-650 mg, Oral, Every 6 hours PRN   Ascorbic Acid (VITAMIN C PO) 1 tablet, Daily at bedtime   b complex vitamins capsule 1 capsule, Daily at bedtime   celecoxib  (CELEBREX ) 200 mg, Oral, 2 times daily PRN   Cholecalciferol (VITAMIN D -3 PO) 1 tablet, Daily at bedtime   Collagen-Vitamin C-Biotin (COLLAGEN PO) 1 capsule, Daily at bedtime   CREATINE PO 1 Scoop, Daily at bedtime   docusate sodium  (COLACE) 100 mg, Oral, 2 times daily   gabapentin  (NEURONTIN ) 200 mg, Oral, 3 times daily   MAGNESIUM  PO 1 capsule, Daily at bedtime   methocarbamol  (ROBAXIN ) 500 mg, Oral, Every 6 hours PRN   oxyCODONE  (OXY IR/ROXICODONE ) 5-10 mg, Oral, Every 4 hours PRN   POTASSIUM PO 1 capsule, Daily at bedtime   RIVAROXABAN  (XARELTO ) VTE STARTER PACK (15 & 20 MG) Follow package directions: Take one 15mg  tablet by mouth twice a day. On day 22, switch to one 20mg  tablet once a day. Take with food.    Diet Orders (From admission,  onward)     Start     Ordered   04/17/24 0000  Diet - low sodium heart healthy        04/17/24 0756   04/14/24 1537  Diet regular Room service appropriate? Yes; Fluid consistency: Thin  Diet effective now       Question Answer Comment  Room service appropriate? Yes   Fluid consistency: Thin      04/14/24 1536            DVT prophylaxis: SCDs Start: 04/14/24 1537 Place TED hose Start: 04/14/24 1537 Rivaroxaban   (XARELTO ) tablet 15 mg  rivaroxaban  (XARELTO ) tablet 20 mg   Lab Results  Component Value Date   PLT 229 04/17/2024      Code Status: Full Code  Family Communication: Wife at bedside  Level of care: Med-Surg  Objective: Vitals:   04/20/24 0200 04/20/24 0202 04/20/24 0600 04/20/24 0818  BP: (!) 140/71  (!) 144/78 (!) 151/59  Pulse: (!) 106  (!) 108 (!) 112  Resp:  20  18  Temp: 98.6 F (37 C)  99 F (37.2 C) 99.9 F (37.7 C)  TempSrc: Oral  Oral Oral  SpO2: 98%  99% 96%  Weight:      Height:        Intake/Output Summary (Last 24 hours) at 04/20/2024 1021 Last data filed at 04/20/2024 9077 Gross per 24 hour  Intake --  Output 375 ml  Net -375 ml   Wt Readings from Last 3 Encounters:  04/14/24 117.9 kg  04/09/24 121.1 kg  10/23/23 123.1 kg    Examination:  Constitutional: NAD Eyes: lids and conjunctivae normal, no scleral icterus ENMT: mmm Neck: normal, supple Respiratory: clear to auscultation bilaterally, no wheezing, no crackles. Normal respiratory effort.  Cardiovascular: Regular rate and rhythm, no murmurs / rubs / gallops. No LE edema. Abdomen: soft, no distention, no tenderness. Bowel sounds positive.    Data Reviewed: I have independently reviewed following labs and imaging studies   CBC Recent Labs  Lab 04/16/24 1347 04/17/24 0750  WBC 14.3* 14.4*  HGB 11.9* 11.5*  HCT 35.1* 33.7*  PLT 232 229  MCV 92.1 91.3  MCH 31.2 31.2  MCHC 33.9 34.1  RDW 13.3 13.1  LYMPHSABS  --  2.5  MONOABS  --  1.5*  EOSABS  --  0.0  BASOSABS  --  0.0    Recent Labs  Lab 04/16/24 1347 04/17/24 0750  NA 133* 134*  K 3.7 3.5  CL 99 95*  CO2 23 24  GLUCOSE 147* 138*  BUN 13 11  CREATININE 1.09 1.12  CALCIUM  8.9 8.9  AST  --  40  ALT  --  18  ALKPHOS  --  33*  BILITOT  --  1.2  ALBUMIN  --  3.0*    ------------------------------------------------------------------------------------------------------------------ No results for input(s): CHOL,  HDL, LDLCALC, TRIG, CHOLHDL, LDLDIRECT in the last 72 hours.  Lab Results  Component Value Date   HGBA1C 5.5 03/17/2021   ------------------------------------------------------------------------------------------------------------------ No results for input(s): TSH, T4TOTAL, T3FREE, THYROIDAB in the last 72 hours.  Invalid input(s): FREET3  Cardiac Enzymes No results for input(s): CKMB, TROPONINI, MYOGLOBIN in the last 168 hours.  Invalid input(s): CK ------------------------------------------------------------------------------------------------------------------ No results found for: BNP  CBG: No results for input(s): GLUCAP in the last 168 hours.  Recent Results (from the past 240 hours)  Culture, blood (Routine X 2) w Reflex to ID Panel     Status: None (Preliminary result)   Collection  Time: 04/18/24 10:40 AM   Specimen: BLOOD LEFT ARM  Result Value Ref Range Status   Specimen Description BLOOD LEFT ARM  Final   Special Requests   Final    BOTTLES DRAWN AEROBIC AND ANAEROBIC Blood Culture results may not be optimal due to an inadequate volume of blood received in culture bottles   Culture   Final    NO GROWTH 2 DAYS Performed at [facility], [address] [place], [place] [address]    Report Status PENDING  Incomplete  Culture, blood (Routine X 2) w Reflex to ID Panel     Status: None (Preliminary result)   Collection Time: 04/18/24 10:53 AM   Specimen: BLOOD LEFT ARM  Result Value Ref Range Status   Specimen Description BLOOD LEFT ARM  Final   Special Requests   Final    BOTTLES DRAWN AEROBIC AND ANAEROBIC Blood Culture results may not be optimal due to an inadequate volume of blood received in culture bottles   Culture   Final    NO GROWTH 2 DAYS Performed at [facility], [address], [place], [place] [address]    Report Status PENDING  Incomplete     Radiology Studies: ECHOCARDIOGRAM COMPLETE Result Date:  04/19/2024    ECHOCARDIOGRAM REPORT   Patient Name:   Roger Pace Date of Exam: 04/19/2024 Medical Rec #:  [id number]         Height:       71.5 in Accession #:    [id number]        Weight:       260.0 lb Date of Birth:  [birth year]         BSA:          2.370 m Patient Age:    52 years          BP:           137/63 mmHg Patient Gender: M                 HR:           117 bpm. Exam Location:  Inpatient Procedure: 2D Echo, Cardiac Doppler and Color Doppler (Both Spectral and Color            Flow Doppler were utilized during procedure). Indications:    Abnormal ECG R94.31  History:        Patient has no prior history of Echocardiogram examinations.                 Arrythmias:Tachycardia.  Sonographer:    Thea Norlander RCS Referring Phys: [phone] NILDA HERO Branden Vine IMPRESSIONS  1. Left ventricular ejection fraction, by estimation, is 65 to 70%. The left ventricle has normal function. The left ventricle has no regional wall motion abnormalities. Left ventricular diastolic parameters were normal.  2. Right ventricular systolic function is hyperdynamic. The right ventricular size is mildly enlarged.  3. The mitral valve is normal in structure. No evidence of mitral valve regurgitation. No evidence of mitral stenosis.  4. The aortic valve is tricuspid. Aortic valve regurgitation is not visualized. No aortic stenosis is present.  5. Aortic dilatation noted. There is mild dilatation of the aortic root, measuring 40 mm.  6. The inferior vena cava is normal in size with greater than 50% respiratory variability, suggesting right atrial pressure of 3 mmHg. Comparison(s): No prior Echocardiogram. FINDINGS  Left Ventricle: Left ventricular ejection fraction, by estimation, is 65 to 70%. The left  ventricle has normal function. The left ventricle has no regional wall motion abnormalities. The left ventricular internal cavity size was normal in size. There is  no left ventricular hypertrophy. Left ventricular diastolic parameters  were normal. Right Ventricle: The right ventricular size is mildly enlarged. No increase in right ventricular wall thickness. Right ventricular systolic function is hyperdynamic. Left Atrium: Left atrial size was normal in size. Right Atrium: Right atrial size was normal in size. Pericardium: There is no evidence of pericardial effusion. Mitral Valve: The mitral valve is normal in structure. Mild mitral annular calcification. No evidence of mitral valve regurgitation. No evidence of mitral valve stenosis. Tricuspid Valve: The tricuspid valve is normal in structure. Tricuspid valve regurgitation is not demonstrated. No evidence of tricuspid stenosis. Aortic Valve: The aortic valve is tricuspid. Aortic valve regurgitation is not visualized. No aortic stenosis is present. Aortic valve peak gradient measures 9.7 mmHg. Pulmonic Valve: The pulmonic valve was normal in structure. Pulmonic valve regurgitation is not visualized. No evidence of pulmonic stenosis. Aorta: Aortic dilatation noted. There is mild dilatation of the aortic root, measuring 40 mm. Venous: The inferior vena cava is normal in size with greater than 50% respiratory variability, suggesting right atrial pressure of 3 mmHg. IAS/Shunts: The interatrial septum appears to be lipomatous. No atrial level shunt detected by color flow Doppler.  LEFT VENTRICLE PLAX 2D LVIDd:         4.40 cm   Diastology LVIDs:         2.80 cm   LV e' medial:    10.10 cm/s LV PW:         1.10 cm   LV E/e' medial:  7.8 LV IVS:        1.00 cm   LV e' lateral:   16.90 cm/s LVOT diam:     2.40 cm   LV E/e' lateral: 4.7 LV SV:         70 LV SV Index:   29 LVOT Area:     4.52 cm  LEFT ATRIUM           Index        RIGHT ATRIUM           Index LA diam:      3.50 cm 1.48 cm/m   RA Area:     10.90 cm LA Vol (A2C): 41.4 ml 17.47 ml/m  RA Volume:   17.30 ml  7.30 ml/m LA Vol (A4C): 36.7 ml 15.49 ml/m  AORTIC VALVE AV Area (Vmax): 3.48 cm AV Vmax:        156.00 cm/s AV Peak Grad:   9.7  mmHg LVOT Vmax:      120.00 cm/s LVOT Vmean:     74.600 cm/s LVOT VTI:       0.154 m  AORTA Ao Root diam: 4.00 cm Ao Asc diam:  3.60 cm MITRAL VALVE MV Area (PHT): 5.02 cm    SHUNTS MV Decel Time: 151 msec    Systemic VTI:  0.15 m MV E velocity: 78.60 cm/s  Systemic Diam: 2.40 cm MV A velocity: 95.40 cm/s MV E/A ratio:  0.82 Stanly Leavens MD Electronically signed by Stanly Leavens MD Signature Date/Time: 04/19/2024/2:25:06 PM    Final       Nilda Fendt, MD, PhD [facility]  Between 7 am - 7 pm I am available, please contact me via Amion (for emergencies) or Securechat (non urgent messages)  Between 7 pm - 7 am I am not available, please  contact night coverage MD/APP via Amion

## 2024-04-20 NOTE — Progress Notes (Signed)
 Inpatient Rehab Coordinator Note:  I met with patient and his spouse, Rosaline, at bedside to discuss CIR recommendations and goals/expectations of CIR stay.  We reviewed 3 hrs/day of therapy, physician follow up, and average length of stay 2 weeks (dependent upon progress) with goals of supervision to mod I.  We discussed plan for CT chest today to evaluate for possible PE and that once completed and POC in place it we could manage on CIR.  We reviewed need for insurance auth and PMR MD to see today for consult and I will open for insurance today.     Reche Lowers, PT, DPT Admissions Coordinator [phone] 04/20/24  11:29 AM

## 2024-04-20 NOTE — Plan of Care (Signed)

## 2024-04-20 NOTE — Plan of Care (Signed)
  Problem: Education: Goal: Knowledge of General Education information will improve Description: Including pain rating scale, medication(s)/side effects and non-pharmacologic comfort measures Outcome: Progressing   Problem: Activity: Goal: Risk for activity intolerance will decrease Outcome: Progressing   Problem: Pain Managment: Goal: General experience of comfort will improve and/or be controlled Outcome: Progressing

## 2024-04-20 NOTE — Progress Notes (Signed)
 Patient ID: Roger Pace, male   DOB: 1971-05-29, 53 y.o.   MRN: [id number] Patient is seen in follow-up status post bilateral total knee arthroplasty.  The dressings are clean and dry.  Plan for CT scan today to evaluate his lungs.

## 2024-04-20 NOTE — PMR Pre-admission (Shared)
 PMR Admission Coordinator Pre-Admission Assessment  Patient: Roger Pace is an 53 y.o., male MRN: [id number] DOB: Feb 08, 1971 Height: 5' 11.5 (181.6 cm) Weight: 117.9 kg              Insurance Information HMO: ***    PPO: ***     PCP:      IPA:      80/20:      OTHER:  PRIMARY: [employer]      Policy#: [id number]       Subscriber: pt CM Name: ***      Phone#: ***     Fax#: *** Pre-Cert#: ***      Employer:  Benefits:  Phone #: [phone]     Name:  Eff. Date: ***     Deduct: ***      Out of Pocket Max: ***      Life Max:   CIR: ***      SNF: *** Outpatient: ***     Co-Pay: *** Home Health: ***      Co-Pay: *** DME: ***     Co-Pay: *** Providers:  SECONDARY:       Policy#:       Phone#:   Financial Counselor:       Phone#:   The "Data Collection Information Summary" for patients in Inpatient Rehabilitation Facilities with attached "Privacy Act Statement-Health Care Records" was provided and verbally reviewed with: Patient and Family  Emergency Contact Information Contact Information     Name Relation Home Work Mobile   [place] Spouse [phone]  [phone]      Other Contacts   None on File    Current Medical History  Patient Admitting Diagnosis: bilateral TKA c/b bilat DVT, ***PE, and orthostatic hypotension  History of Present Illness: Pt is a 53 y/o male with PMH of umbilical hernia repair, and OA who was admitted to [facility] on 04/14/[facility] for bilateral TKA with Dr. Addie.  Post op course complicated by low grade fevers, intermittent tachycardia.  Bilateral LE dopplers were completed and showed DVT in the left posterior tibial vein and the right peroneal vein.  Given high risk for proximal extension, he was started on xarelto  with plan for 3 month course of therapy at minimum and f/u with PCP.  He had ongoing low grade fevers despite postop ancef  x3 doses.  POD 3 pt significant orthostatic with PT with diaphoresis/nausea, unable to complete standing BP, but  trending from 144/89 to 112/76 in sitting to 86/62 on immediate return to sitting.  He had ongoing tacchycardia with therapy so 2D echo and CT chest ordered.  Echo showed mild right ventricle hypertrophy.  CTA pending as of 9/1 ***.  Therapy ongoing and recommendations were updated to CIR given functional decline and medical complexity.     Glasgow Coma Scale Score: 15  [facility]'s medical record from [name] has been reviewed by the rehabilitation admission coordinator and physician.  Past Medical History  Past Medical History:  Diagnosis Date   Arthritis     Has the patient had major surgery during 100 days prior to admission? Yes  Family History  family history includes Arrhythmia in his brother; Cancer in his father, maternal uncle, paternal uncle, and sister; Diabetes in his father; Hyperlipidemia in his brother; Hypertension in his brother, father, and mother; Irregular heart beat in his mother; Leukemia in his sister; Osteoarthritis in his mother; Other in his father; Prostate cancer in his father, maternal uncle, and paternal uncle.   Current Medications  Current Facility-Administered Medications:    0.9 %  sodium chloride  infusion, , Intravenous, Once, Magnant, Charles L, PA-C   acetaminophen  (TYLENOL ) tablet 325-650 mg, 325-650 mg, Oral, Q6H PRN, Magnant, Charles L, PA-C, 650 mg at 04/18/24 0707   calcium  carbonate (TUMS - dosed in mg elemental calcium ) chewable tablet 200 mg of elemental calcium , 1 tablet, Oral, TID PRN, Gherghe, Costin M, MD, 200 mg of elemental calcium  at 04/19/24 1324   gabapentin  (NEURONTIN ) capsule 200 mg, 200 mg, Oral, TID, Magnant, Charles L, PA-C, 200 mg at 04/20/24 0900   HYDROmorphone  (DILAUDID ) injection 0.5 mg, 0.5 mg, Intravenous, Q4H PRN, Magnant, Charles L, PA-C, 0.5 mg at 04/17/24 1355   lactated ringers  infusion, , Intravenous, Continuous, Woodrum, Chelsey L, MD, Stopped at 04/14/24 1259   menthol -cetylpyridinium (CEPACOL) lozenge 3 mg, 1  lozenge, Oral, PRN **OR** phenol (CHLORASEPTIC) mouth spray 1 spray, 1 spray, Mouth/Throat, PRN, Magnant, Charles L, PA-C   methocarbamol  (ROBAXIN ) tablet 500 mg, 500 mg, Oral, Q6H PRN, 500 mg at 04/20/24 0849 **OR** methocarbamol  (ROBAXIN ) injection 500 mg, 500 mg, Intravenous, Q6H PRN, Magnant, Charles L, PA-C   metoCLOPramide  (REGLAN ) tablet 5-10 mg, 5-10 mg, Oral, Q8H PRN **OR** metoCLOPramide  (REGLAN ) injection 5-10 mg, 5-10 mg, Intravenous, Q8H PRN, Magnant, Charles L, PA-C   metoprolol  tartrate (LOPRESSOR ) injection 2.5 mg, 2.5 mg, Intravenous, Q6H PRN, Georgina Ozell LABOR, MD, 2.5 mg at 04/16/24 2038   ondansetron  (ZOFRAN ) tablet 4 mg, 4 mg, Oral, Q6H PRN, Magnant, Charles L, PA-C   oxyCODONE  (Oxy IR/ROXICODONE ) immediate release tablet 5-10 mg, 5-10 mg, Oral, Q4H PRN, Magnant, Charles L, PA-C, 10 mg at 04/20/24 0849   polyethylene glycol (MIRALAX  / GLYCOLAX ) packet 17 g, 17 g, Oral, BID, Gherghe, Costin M, MD, 17 g at 04/20/24 9160   Rivaroxaban  (XARELTO ) tablet 15 mg, 15 mg, Oral, BID WC, 15 mg at 04/20/24 0838 **FOLLOWED BY** [START ON 05/07/2024] rivaroxaban  (XARELTO ) tablet 20 mg, 20 mg, Oral, Q supper, Melvin, Alexander B, MD   senna-docusate (Senokot-S) tablet 2 tablet, 2 tablet, Oral, BID, Gherghe, Costin M, MD, 2 tablet at 04/20/24 0900   sorbitol  70 % solution 45 mL, 45 mL, Oral, Once, Lovorn, Megan, MD  Patients Current Diet:  Diet Order             Diet - low sodium heart healthy           Diet regular Room service appropriate? Yes; Fluid consistency: Thin  Diet effective now                   Precautions / Restrictions Precautions Precautions: Fall, Knee Precaution Booklet Issued: Yes (comment) Precaution/Restrictions Comments: Pt and wife educated on need for knee extension at this time. Bed locked in extension, and education provided that NO pillow/roll/ice pack should be placed under the knee. 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  Independent  Indoor Mobility: Did the patient need assistance with walking from room to room (with or without device)? Independent  Stairs: Did the patient need assistance with internal or external stairs (with or without device)? Independent  Functional Cognition: Did the patient need help planning regular tasks such as shopping or remembering to take medications? Independent  Patient Information Are you of Hispanic, Latino/a,or Spanish origin?: A. No, not of Hispanic, Latino/a, or Spanish origin What is your race?: B. Black or African American Do you need or want an interpreter to communicate with a doctor or health care staff?: 0. No  Patient's Response To:  Health Literacy and Transportation Is the patient able to respond to health literacy and transportation needs?: Yes Health Literacy - How often do you need to have someone help you when you read instructions, pamphlets, or other written material from your doctor or pharmacy?: Never In the past 12 months, has lack of transportation kept you from medical appointments or from getting medications?: No In the past 12 months, has lack of transportation kept you from meetings, work, or from getting things needed for daily living?: No  Home  Assistive Devices / Equipment Home Equipment: None  Prior Device Use: Indicate devices/aids used by the patient prior to current illness, exacerbation or injury? None of the above  Current Functional Level Cognition  Orientation Level: Oriented X4    Extremity Assessment (includes Sensation/Coordination)  Upper Extremity Assessment: Right hand dominant, Overall WFL for tasks assessed  Lower Extremity Assessment: Defer to PT evaluation RLE Deficits / Details: generalized weakness, ROM not fully assessed as pt in knee immobilizer LLE Deficits / Details: generalized weakness and knee ROM deficits as anticipated on POD 0    ADLs  Overall ADL's : Needs assistance/impaired Eating/Feeding: Independent, Bed level Grooming: Independent, Bed level Upper Body Bathing: Set up, Bed level Lower Body Bathing: Moderate assistance, Maximal assistance, Cueing for compensatory techniques, Bed level Upper Body Dressing : Independent, Bed level Lower Body Dressing: Moderate assistance, Maximal assistance, Bed level, Cueing for compensatory techniques Toilet Transfer: Minimal assistance, Maximal assistance, +2 for physical assistance, Ambulation, BSC/3in1, Rolling walker (2 wheels) Toilet Transfer Details (indicate cue type and reason): simulated bed to chair; Max assist +2 to power up and Min assist +2 once in standing Toileting- Clothing Manipulation and Hygiene: Set up, Maximal assistance, Bed level Toileting - Clothing Manipulation Details (indicate cue type and reason): Set up for use of hand held urinal and Max assist for peri care from bed level Functional mobility during ADLs: Minimal assistance, +2 for physical assistance, Rolling walker (2 wheels) General ADL Comments: Pt functional level limited by increased HR in sitting and standing this session.    Mobility  Overal bed mobility: Needs Assistance Bed Mobility: Sit to Supine, Supine to Sit Supine to sit: HOB elevated, Min assist, Used  rails Sit to supine: Used rails, Mod assist General bed mobility comments: Assist for BLE advancement towards EOB. Pt with heavy use of UE's and rails for support. Increased time required for transition fully to EOB. Upon return to supine at end of session, pt required BLE elevation back up into bed. Pt able to use UE's at [facility] to reposition and straighten out.    Transfers  Overall transfer level: Needs assistance Equipment used: Rolling walker (2 wheels) Transfers: Sit to/from Stand, Bed to chair/wheelchair/BSC Sit to Stand: Max assist, From elevated surface, Via lift equipment Bed to/from chair/wheelchair/BSC transfer type:: Step pivot Step pivot transfers:  Min assist, +2 safety/equipment Transfer via Lift Equipment: Stedy General transfer comment: Heavy assist to power up to full stand from elevated surface and use of Stedy for support. Increased time to gain/maintain standing balance.    Ambulation / Gait / Stairs / Wheelchair Mobility  Ambulation/Gait Ambulation/Gait assistance: Min assist, +2 safety/equipment, +2 physical assistance Gait Distance (Feet): 50 Feet Assistive device: Rolling walker (2 wheels) Gait Pattern/deviations: Step-to pattern, Shuffle, Trunk flexed, Antalgic, Step-through pattern, Decreased stride length General Gait Details: Did not advance to gait training this session. Focus was transfer training and exercise. Gait velocity: Decreased Gait velocity interpretation: <1.31 ft/sec, indicative of household ambulator Pre-gait activities: standing weight shift to L/R sides in [place] platform a few reps but not able to take steps 2/2 orthostatic hypotension symptoms    Posture / Balance Balance Overall balance assessment: Needs assistance Sitting-balance support: No upper extremity supported, Feet supported Sitting balance-Leahy Scale: Good Standing balance support: Bilateral upper extremity supported, During functional activity, Reliant on assistive device for  balance Standing balance-Leahy Scale: Poor Standing balance comment: reliant on UE support    Special considerations/ Life events CPM , Skin bilateral knee incisions, and Special service needs new xarelto , monitor Hgb      Previous Home Environment (from acute therapy documentation) Living Arrangements: Spouse/significant other Available Help at Discharge: Family Type of Home: Apartment Home Layout: One level Home Access: Engineer, structural, Level entry Bathroom Shower/Tub: Health visitor: Standard Home Care Services: No Additional Comments: has two grown children who live in other parts of the state  Discharge Living Setting Plans for Discharge Living Setting: Patient's home, Lives with (comment) (spouse) Type of Home at Discharge: Apartment Discharge Home Layout: One level Discharge Home Access: Elevator, Level entry Discharge Bathroom Shower/Tub: Walk-in shower Discharge Bathroom Toilet: Standard Discharge Bathroom Accessibility: Yes How Accessible: Accessible via walker Does the patient have any problems obtaining your medications?: No  Social/Family/Support Systems Patient Roles: Spouse Anticipated Caregiver: spouse, Rosaline Anticipated Industrial/product designer Information: [phone] Ability/Limitations of Caregiver: min assist Caregiver Availability: 24/7 Discharge Plan Discussed with Primary Caregiver: Yes Is Caregiver In Agreement with Plan?: Yes Does Caregiver/Family have Issues with Lodging/Transportation while Pt is in Rehab?: No   Goals Patient/Family Goal for Rehab: PT/OT mod I, SLP n/a Expected length of stay: 12-15 days Additional Information: Discharge plan: expect mod I goals, home is easily accessible, spouse available to assist if needed Pt/Family Agrees to Admission and willing to participate: Yes Program Orientation Provided & Reviewed with Pt/Caregiver Including Roles  & Responsibilities: Yes   Decrease burden of Care through IP rehab  admission: n/a   Possible need for SNF placement upon discharge: Not anticipated.  Plan for d/c home with mod I goals, spouse able to provide supervision if needed.    Patient Condition: This patient's condition remains as documented in the consult dated 04/20/24, in which the Rehabilitation Physician determined and documented that the patient's condition is appropriate for intensive rehabilitative care in an inpatient rehabilitation facility. Will admit to inpatient rehab pending insurance auth ***.  Preadmission Screen Completed By:  Reche FORBES Lowers, PT, DPT 04/20/2024 11:37 AM ______________________________________________________________________   Discussed status with Dr. PIERRETTEon***at *** and received approval for admission today.  Admission Coordinator:  Arthea Nobel E Delpha Perko, time***/Date***

## 2024-04-21 DIAGNOSIS — Z96653 Presence of artificial knee joint, bilateral: Secondary | ICD-10-CM | POA: Diagnosis not present

## 2024-04-21 MED ORDER — SODIUM CHLORIDE 0.9 % IV SOLN: INTRAVENOUS | Status: AC

## 2024-04-21 MED ORDER — MAGNESIUM CITRATE PO SOLN
1.0000 | Freq: Once | ORAL | Status: AC
  Administered 2024-04-21: 1 via ORAL
  Filled 2024-04-21: qty 296

## 2024-04-21 MED ORDER — BISACODYL 10 MG RE SUPP
10.0000 mg | Freq: Once | RECTAL | Status: AC
  Administered 2024-04-21: 10 mg via RECTAL
  Filled 2024-04-21: qty 1

## 2024-04-21 NOTE — Progress Notes (Signed)
  Subjective: Patient feels better this morning.  Mobilized some yesterday standing in the room   Objective: Vital signs in last 24 hours: Temp:  [99.5 F (37.5 C)-100.2 F (37.9 C)] 99.5 F (37.5 C) (09/02 0803) Pulse Rate:  [106-117] 106 (09/02 0803) Resp:  [16-18] 16 (09/02 0803) BP: (125-144)/(64-72) 144/69 (09/02 0803) SpO2:  [98 %-100 %] 99 % (09/02 0803)  Intake/Output from previous day: 09/01 0701 - 09/02 0700 In: -  Out: 375 [Urine:375] Intake/Output this shift: No intake/output data recorded.  Exam:  Neurovascular intact Sensation intact distally Intact pulses distally No cellulitis present  Labs: No results for input(s): HGB in the last 72 hours. No results for input(s): WBC, RBC, HCT, PLT in the last 72 hours. Recent Labs    04/20/24 1209  NA 128*  K 4.0  CL 92*  CO2 24  BUN 18  CREATININE 1.15  GLUCOSE 133*  CALCIUM  8.2*   No results for input(s): LABPT, INR in the last 72 hours.  Assessment/Plan: Plan at this time is to continue with physical therapy.  Patient looks like he would be a good candidate for 1 week of rehab.  Getting up to about 60 degrees on CPM with the knee.  Study yesterday was positive for right lower lobe pulmonary embolism which makes sense with his clinical picture.  Currently on Xarelto .  Plan is to continue physical therapy and mobilization until rehab decides if they can take him or not.   G Scott Chia Mowers 04/21/2024, 11:51 AM

## 2024-04-21 NOTE — Progress Notes (Signed)
 PROGRESS NOTE  [name] Pace FMW:[id number] DOB: 04-08-1971 DOA: 04/14/2024 PCP: Roger Sharper, MD   LOS: 7 days   Brief Narrative / Interim history: 53 y.o. male with medical history significant of obesity, arthritis who presented for bilateral TKA surgery.  Was found to have fevers and Pace further workup was found to have acute DVT, hospitalist team consulted  Subjective / 24h Interval events: Denies any chest pain, no shortness of breath.  Assesement and Plan: Principal Problem:   S/P TKR (total knee replacement), bilateral Active Problems:   Class 2 obesity without serious comorbidity with body mass index (BMI) of 38.0 to 38.9 in adult   DVT (deep venous thrombosis) (HCC)   Tachycardia   Fever   Arthritis of right knee   Arthritis of left knee  Principal problem Acute DVT, acute PE-DVT study showed bilateral acute DVTs, started Pace Xarelto , continue.  Due to persistent tachycardia, eventually underwent a CT angiogram which also showed PE - Continue Xarelto , seems to be tolerating anticoagulation well  Active problems Dizziness, orthostatic hypotension, sinus tachycardia-this may be multifactorial in this patient who has been mostly in bed over the last 5 days, is postoperative, is intermittently febrile, is receiving pain medications which all can decrease blood pressure, also has a PE.  He has been eating well but has not been drinking as many fluids.  - 2D echocardiogram showed normal LVEF, however RV was hyperdynamic with mild enlargement, possibly due to his PE versus untreated OSA  Hyponatremia, hypochloremic-slight intravascular depletion possible, limited fluids today  Status post bilateral TKR-per primary, PT now recommends inpatient rehab and he may be a candidate.  Agree with that, pursue CIR when bed is ready  Obesity, class II-BMI 35.7, he would benefit from weight loss  Scheduled Meds:  bisacodyl   10 mg Rectal Once   gabapentin   200 mg Oral TID    polyethylene glycol  17 g Oral BID   Rivaroxaban   15 mg Oral BID WC   Followed by   Roger Pace 05/07/2024] rivaroxaban   20 mg Oral Q supper   senna-docusate  2 tablet Oral BID   Continuous Infusions:  sodium chloride  75 mL/hr at 04/21/24 0842   lactated ringers  Stopped (04/14/24 1259)   PRN Meds:.acetaminophen , calcium  carbonate, HYDROmorphone  (DILAUDID ) injection, menthol -cetylpyridinium **OR** phenol, methocarbamol  **OR** methocarbamol  (ROBAXIN ) injection, metoCLOPramide  **OR** metoCLOPramide  (REGLAN ) injection, metoprolol  tartrate, ondansetron , oxyCODONE   Current Outpatient Medications  Medication Instructions   acetaminophen  (TYLENOL ) 325-650 mg, Oral, Every 6 hours PRN   Ascorbic Acid (VITAMIN C PO) 1 tablet, Daily at bedtime   b complex vitamins capsule 1 capsule, Daily at bedtime   celecoxib  (CELEBREX ) 200 mg, Oral, 2 times daily PRN   Cholecalciferol (VITAMIN D -3 PO) 1 tablet, Daily at bedtime   Collagen-Vitamin C-Biotin (COLLAGEN PO) 1 capsule, Daily at bedtime   CREATINE PO 1 Scoop, Daily at bedtime   docusate sodium  (COLACE) 100 mg, Oral, 2 times daily   gabapentin  (NEURONTIN ) 200 mg, Oral, 3 times daily   MAGNESIUM  PO 1 capsule, Daily at bedtime   methocarbamol  (ROBAXIN ) 500 mg, Oral, Every 6 hours PRN   oxyCODONE  (OXY IR/ROXICODONE ) 5-10 mg, Oral, Every 4 hours PRN   POTASSIUM PO 1 capsule, Daily at bedtime   RIVAROXABAN  (XARELTO ) VTE STARTER PACK (15 & 20 MG) Follow package directions: Take one 15mg  tablet by mouth twice a day. Pace day 22, switch to one 20mg  tablet once a day. Take with food.    Diet Orders (From admission, onward)  Start     Ordered   04/17/24 0000  Diet - low sodium heart healthy        04/17/24 0756   04/14/24 1537  Diet regular Room service appropriate? Yes; Fluid consistency: Thin  Diet effective now       Question Answer Comment  Room service appropriate? Yes   Fluid consistency: Thin      04/14/24 1536            DVT prophylaxis:  SCDs Start: 04/14/24 1537 Place TED hose Start: 04/14/24 1537 Rivaroxaban  (XARELTO ) tablet 15 mg  rivaroxaban  (XARELTO ) tablet 20 mg   Lab Results  Component Value Date   PLT 229 04/17/2024      Code Status: Full Code  Family Communication: Wife at bedside  Level of care: Med-Surg  Objective: Vitals:   04/20/24 1100 04/20/24 1500 04/20/24 1956 04/21/24 0803  BP: 137/60 125/64 135/72 (!) 144/69  Pulse: (!) 111 (!) 108 (!) 117 (!) 106  Resp:   18 16  Temp: 100.2 F (37.9 C) 100.2 F (37.9 C) 100.2 F (37.9 C) 99.5 F (37.5 C)  TempSrc: Oral Oral Oral Oral  SpO2: 94% 98% 100% 99%  Weight:      Height:       No intake or output data in the 24 hours ending 04/21/24 1006  Wt Readings from Last 3 Encounters:  04/14/24 117.9 kg  04/09/24 121.1 kg  10/23/23 123.1 kg    Examination: Constitutional: NAD Eyes: lids and conjunctivae normal, no scleral icterus ENMT: mmm Neck: normal, supple Respiratory: clear to auscultation bilaterally, no wheezing, no crackles. Normal respiratory effort.  Cardiovascular: Regular rate and rhythm, no murmurs / rubs / gallops. No LE edema. Abdomen: soft, no distention, no tenderness. Bowel sounds positive.   Data Reviewed: I have independently reviewed following labs and imaging studies   CBC Recent Labs  Lab 04/16/24 1347 04/17/24 0750  WBC 14.3* 14.4*  HGB 11.9* 11.5*  HCT 35.1* 33.7*  PLT 232 229  MCV 92.1 91.3  MCH 31.2 31.2  MCHC 33.9 34.1  RDW 13.3 13.1  LYMPHSABS  --  2.5  MONOABS  --  1.5*  EOSABS  --  0.0  BASOSABS  --  0.0    Recent Labs  Lab 04/16/24 1347 04/17/24 0750 04/20/24 1209  NA 133* 134* 128*  K 3.7 3.5 4.0  CL 99 95* 92*  CO2 23 24 24   GLUCOSE 147* 138* 133*  BUN 13 11 18   CREATININE 1.09 1.12 1.15  CALCIUM  8.9 8.9 8.2*  AST  --  40  --   ALT  --  18  --   ALKPHOS  --  33*  --   BILITOT  --  1.2  --   ALBUMIN  --  3.0*  --      ------------------------------------------------------------------------------------------------------------------ No results for input(s): CHOL, HDL, LDLCALC, TRIG, CHOLHDL, LDLDIRECT in the last 72 hours.  Lab Results  Component Value Date   HGBA1C 5.5 03/17/2021   ------------------------------------------------------------------------------------------------------------------ No results for input(s): TSH, T4TOTAL, T3FREE, THYROIDAB in the last 72 hours.  Invalid input(s): FREET3  Cardiac Enzymes No results for input(s): CKMB, TROPONINI, MYOGLOBIN in the last 168 hours.  Invalid input(s): CK ------------------------------------------------------------------------------------------------------------------ No results found for: BNP  CBG: No results for input(s): GLUCAP in the last 168 hours.  Recent Results (from the past 240 hours)  Culture, blood (Routine X 2) w Reflex to ID Panel     Status: None (Preliminary result)  Collection Time: 04/18/24 10:40 AM   Specimen: BLOOD LEFT ARM  Result Value Ref Range Status   Specimen Description BLOOD LEFT ARM  Final   Special Requests   Final    BOTTLES DRAWN AEROBIC AND ANAEROBIC Blood Culture results may not be optimal due to an inadequate volume of blood received in culture bottles   Culture   Final    NO GROWTH 3 DAYS Performed at [facility], [address] [place], [place] [address]    Report Status PENDING  Incomplete  Culture, blood (Routine X 2) w Reflex to ID Panel     Status: None (Preliminary result)   Collection Time: 04/18/24 10:53 AM   Specimen: BLOOD LEFT ARM  Result Value Ref Range Status   Specimen Description BLOOD LEFT ARM  Final   Special Requests   Final    BOTTLES DRAWN AEROBIC AND ANAEROBIC Blood Culture results may not be optimal due to an inadequate volume of blood received in culture bottles   Culture   Final    NO GROWTH 3 DAYS Performed at [facility], [address] [place], [place] [address]    Report Status PENDING  Incomplete     Radiology Studies: CT Angio Chest Pulmonary Embolism (PE) W or WO Contrast Addendum Date: 04/20/2024 ADDENDUM REPORT: 04/20/2024 16:12 ADDENDUM: Critical Value/emergent results were called by telephone at the time of interpretation Pace 04/20/2024 at 4:11 pm to provider Nashya Garlington , who verbally acknowledged these results. Electronically Signed   By: Leita Birmingham M.D.   Pace: 04/20/2024 16:12   Result Date: 04/20/2024 CLINICAL DATA:  Pulmonary embolism suspected, high probability. EXAM: CT ANGIOGRAPHY CHEST WITH CONTRAST TECHNIQUE: Multidetector CT imaging of the chest was performed using the standard protocol during bolus administration of intravenous contrast. Multiplanar CT image reconstructions and MIPs were obtained to evaluate the vascular anatomy. RADIATION DOSE REDUCTION: This exam was performed according to the departmental dose-optimization program which includes automated exposure control, adjustment of the mA and/or kV according to patient size and/or use of iterative reconstruction technique. CONTRAST:  75mL OMNIPAQUE  IOHEXOL  350 MG/ML SOLN COMPARISON:  None Available. FINDINGS: Cardiovascular: The heart is normal in size and there is no pericardial effusion. There is atherosclerotic calcification of the aorta without evidence of aneurysm. The pulmonary trunk is normal in caliber. Examination is limited due to mixing artifact and respiratory motion. Segmental and subsegmental pulmonary emboli are present in the right lower lobe. No definite evidence of right heart strain. Mediastinum/Nodes: No enlarged mediastinal, hilar, or axillary lymph nodes. Thyroid  gland, trachea, and esophagus demonstrate no significant findings. Lungs/Pleura: Mild atelectasis is noted bilaterally. No effusion or pneumothorax. Upper Abdomen: No acute abnormality. Musculoskeletal: No acute osseous abnormality. Review of the MIP  images confirms the above findings. IMPRESSION: 1. Segmental and subsegmental pulmonary emboli in the right lower lobe. No evidence of right heart strain. 2. Aortic atherosclerosis. Electronically Signed: By: Leita Birmingham M.D. Pace: 04/20/2024 15:56      Roger Fendt, MD, PhD [facility]  Between 7 am - 7 pm I am available, please contact me via Amion (for emergencies) or Securechat (non urgent messages)  Between 7 pm - 7 am I am not available, please contact night coverage MD/APP via Amion

## 2024-04-21 NOTE — Progress Notes (Signed)
 Occupational Therapy Treatment Patient Details Name: Roger Pace MRN: [id number] DOB: 19-Oct-1970 Today's Date: 04/21/2024   History of present illness 53 y.o. male presents to [facility] on 04/14/2024 for elective bilateral TKA. Was found to have fevers and on further workup was found to have bilateral LE acute DVT. Pt limited due to symptomatic orthostatic hypotension 8/29 and 8/30 PT sessions. PMH includes umbilical hernia repair, OA.   OT comments  Pt seen in conjunction with PT to progress ADLs, transfers and gait. Pt continues to require Max A x 2 to stand d/t [name] pain and decreased ROM tolerance, but once up, able to mobilize to/from bathroom and in hallway using RW. Pt able to manage brief ADLs standing at sink and practice initial toilet transfers (BSC raised to highest setting). Pt and family remain hopeful for continued improvements with intensive rehab services.      If plan is discharge home, recommend the following:  Two people to help with walking and/or transfers;Assistance with cooking/housework;A lot of help with bathing/dressing/bathroom;Assist for transportation;Help with stairs or ramp for entrance   Equipment Recommendations  Tub/shower seat;Other (comment) (AE for LB ADLs)    Recommendations for Other Services Rehab consult    Precautions / Restrictions Precautions Precautions: Fall;Knee Recall of Precautions/Restrictions: Intact Precaution/Restrictions Comments: Pt educated on need for knee extension at this time. Bed locked in extension, and education provided that NO pillow/roll/ice pack should be placed under the knee. Required Braces or Orthoses: Knee Immobilizer - Left Knee Immobilizer - Right: Other (comment) (per Dr. Addie, try to alternate knee immobilizer between legs to allow each leg the chance to bear more of the load when mobilizing) Knee Immobilizer - Left: Other (comment) Restrictions Weight Bearing Restrictions Per Provider Order: Yes RLE  Weight Bearing Per Provider Order: Weight bearing as tolerated LLE Weight Bearing Per Provider Order: Weight bearing as tolerated       Mobility Bed Mobility Overal bed mobility: Needs Assistance Bed Mobility: Supine to Sit     Supine to sit: Min assist, HOB elevated, Used rails     General bed mobility comments: use of bedrails. light assist for BLE to EOB and assist to scoot hips to EOB using bed pad    Transfers Overall transfer level: Needs assistance Equipment used: Rolling walker (2 wheels) Transfers: Sit to/from Stand Sit to Stand: Max assist, +2 physical assistance, +2 safety/equipment, From elevated surface           General transfer comment: Max A x 2 to stand from bedside and BSC over toilet in bathroom. assist to bring L foot under self/bend knee as much as possible to assist with transfer (though still pain limited)     Balance Overall balance assessment: Needs assistance Sitting-balance support: No upper extremity supported, Feet supported Sitting balance-Leahy Scale: Good     Standing balance support: Bilateral upper extremity supported, During functional activity, Reliant on assistive device for balance Standing balance-Leahy Scale: Poor                             ADL either performed or assessed with clinical judgement   ADL Overall ADL's : Needs assistance/impaired     Grooming: Contact guard assist;Standing;Wash/dry face Grooming Details (indicate cue type and reason): washing face, drying hands standing at sink. able to briefly stand without UE support but reports not fully trusting his knees to hold him up yet  Lower Body Dressing: Total assistance;Bed level Lower Body Dressing Details (indicate cue type and reason): sock mgmt Toilet Transfer: Contact guard assist;Maximal assistance;+2 for physical assistance;+2 for safety/equipment;Ambulation;BSC/3in1;Rolling walker (2 wheels);Regular Teacher, adult education Details  (indicate cue type and reason): BSC over toilet to highest height. Pt able to mobilize to/from bathroom with RW with CGA +2 for safety. Max A x 2 to stand from [facility] over toilet with one hand on grab bar and one hand on RW. assist to bring LLE back (R KI on during this session) as able         Functional mobility during ADLs: Contact guard assist;+2 for safety/equipment;Rolling walker (2 wheels)      Extremity/Trunk Assessment Upper Extremity Assessment Upper Extremity Assessment: Overall WFL for tasks assessed;Right hand dominant   Lower Extremity Assessment Lower Extremity Assessment: Defer to PT evaluation        Vision   Vision Assessment?: No apparent visual deficits   Perception     Praxis     Communication Communication Communication: No apparent difficulties   Cognition Arousal: Alert Behavior During Therapy: WFL for tasks assessed/performed Cognition: No apparent impairments                               Following commands: Intact        Cueing   Cueing Techniques: Verbal cues  Exercises      Shoulder Instructions       General Comments Wife and daughter present, hands on to assist and assisted with chair follow    Pertinent Vitals/ Pain       Pain Assessment Pain Assessment: 0-10 Pain Score: 6  Pain Location: B knees Pain Descriptors / Indicators: Discomfort, Grimacing, Operative site guarding, Sharp Pain Intervention(s): Monitored during session, Premedicated before session  Home Living                                          Prior Functioning/Environment              Frequency  Min 2X/week        Progress Toward Goals  OT Goals(current goals can now be found in the care plan section)  Progress towards OT goals: Progressing toward goals  Acute Rehab OT Goals Patient Stated Goal: continue to improve, go to rehab here, pain control OT Goal Formulation: With patient Time For Goal Achievement:  05/03/24 Potential to Achieve Goals: Good ADL Goals Pt Will Perform Lower Body Bathing: with min assist;with adaptive equipment;sit to/from stand Pt Will Perform Lower Body Dressing: with min assist;with adaptive equipment;sit to/from stand Pt Will Transfer to Toilet: with contact guard assist;ambulating;regular height toilet;grab bars Pt Will Perform Toileting - Clothing Manipulation and hygiene: with min assist;sit to/from stand  Plan      Co-evaluation    PT/OT/SLP Co-Evaluation/Treatment: Yes Reason for Co-Treatment: For patient/therapist safety;To address functional/ADL transfers PT goals addressed during session: Mobility/safety with mobility;Proper use of DME;Strengthening/ROM;Balance OT goals addressed during session: ADL's and self-care;Proper use of Adaptive equipment and DME      AM-PAC OT 6 Clicks Daily Activity     Outcome Measure   Help from another person eating meals?: None Help from another person taking care of personal grooming?: None Help from another person toileting, which includes using toliet, bedpan, or urinal?: A Lot Help from another person bathing (  including washing, rinsing, drying)?: A Lot Help from another person to put on and taking off regular upper body clothing?: None Help from another person to put on and taking off regular lower body clothing?: A Lot 6 Click Score: 18    End of Session Equipment Utilized During Treatment: Gait belt;Rolling walker (2 wheels) CPM Left Knee CPM Left Knee: Off Left Knee Flexion (Degrees): 10 Left Knee Extension (Degrees): 40 Additional Comments:  (on from 0300 to 0500) CPM Right Knee CPM Right Knee: Off Right Knee Flexion (Degrees): 50 Right Knee Extension (Degrees): 6  OT Visit Diagnosis: Other abnormalities of gait and mobility (R26.89);Unsteadiness on feet (R26.81);Pain Pain - Right/Left: Right   Activity Tolerance Patient tolerated treatment well   Patient Left in chair;with call bell/phone within  reach;with family/visitor present   Nurse Communication          Time: 9046-8954 OT Time Calculation (min): 52 min  Charges: OT General Charges $OT Visit: 1 Visit OT Treatments $Self Care/Home Management : 8-22 mins $Therapeutic Activity: 8-22 mins  Mliss NOVAK, OTR/L Acute Rehab Services Office: [phone]   Mliss Fish 04/21/2024, 11:28 AM

## 2024-04-21 NOTE — Progress Notes (Signed)
 Physical Therapy Treatment  Patient Details Name: Roger Pace MRN: [id number] DOB: 02/23/1971 Today's Date: 04/21/2024   History of Present Illness Pt is a 53 y.o. male who presents to [facility] hospital on 04/14/2024 for elective bilateral TKA. Post-op, was found to have fevers and on further workup was found to have acute bilateral LE DVT. Pt with orthostatic hypotension and tachycardia in following PT sessions. Due to persistent tachycardia, eventually underwent a CT angiogram which also showed PE on 04/20/2024. PMH includes umbilical hernia repair, OA.    PT Comments  Pt progressing towards physical therapy goals. Motivated to participate. Sit>stand remains difficult with +2 assist for transition to stand, however pt becoming smoother with gait and able to accept feedback and make corrective changes to gait deviations. Acute PT recommendations remain appropriate at this time. Feel pt will thrive in the AIR environment, and continue to recommend AIR level rehab at d/c.     If plan is discharge home, recommend the following: A little help with walking and/or transfers;A little help with bathing/dressing/bathroom;Assistance with cooking/housework;Assist for transportation;Help with stairs or ramp for entrance   Can travel by private vehicle        Equipment Recommendations  Rolling walker (2 wheels);BSC/3in1    Recommendations for Other Services Rehab consult;OT consult     Precautions / Restrictions Precautions Precautions: Fall;Knee Precaution Booklet Issued: Yes (comment) Recall of Precautions/Restrictions: Intact Precaution/Restrictions Comments: Pt educated on need for knee extension at this time. Bed locked in extension, and education provided that NO pillow/roll/ice pack should be placed under the knee. Required Braces or Orthoses: Knee Immobilizer - Left Knee Immobilizer - Right: Other (comment) (per Dr. Addie, try to alternate knee immobilizer between legs to allow each leg the  chance to bear more of the load when mobilizing) Knee Immobilizer - Left: Other (comment) (see above) Restrictions Weight Bearing Restrictions Per Provider Order: Yes RLE Weight Bearing Per Provider Order: Weight bearing as tolerated LLE Weight Bearing Per Provider Order: Weight bearing as tolerated     Mobility  Bed Mobility Overal bed mobility: Needs Assistance Bed Mobility: Sit to Supine     Supine to sit: HOB elevated, Min assist, Used rails, +2 for physical assistance, +2 for safety/equipment Sit to supine: Used rails, Mod assist, +2 for physical assistance   General bed mobility comments: +2 assist for elevation of LE's up into bed and for shoulder pivot to middle of bed.    Transfers Overall transfer level: Needs assistance Equipment used: Rolling walker (2 wheels) Transfers: Sit to/from Stand Sit to Stand: Max assist, From elevated surface, +2 physical assistance   Step pivot transfers: Min assist, +2 safety/equipment       General transfer comment: Heavy assist to power up to full stand from elevated surface. Increased time to gain/maintain standing balance.    Ambulation/Gait Ambulation/Gait assistance: Min assist, +2 safety/equipment, +2 physical assistance Gait Distance (Feet): 75 Feet Assistive device: Rolling walker (2 wheels) Gait Pattern/deviations: Shuffle, Trunk flexed, Antalgic, Step-through pattern, Decreased stride length Gait velocity: Decreased Gait velocity interpretation: <1.31 ft/sec, indicative of household ambulator   General Gait Details: Pt motivated for distance. He was able to improve step/stride length, heel strike, and overall fluidity of walker movement. Chair follow utilized but pt declined seated rest break or rollling back to room in chair.   Stairs             Wheelchair Mobility     Tilt Bed    Modified Rankin (Stroke Patients Only)  Balance Overall balance assessment: Needs assistance Sitting-balance  support: No upper extremity supported, Feet supported Sitting balance-Leahy Scale: Good     Standing balance support: Bilateral upper extremity supported, During functional activity, Reliant on assistive device for balance Standing balance-Leahy Scale: Poor Standing balance comment: reliant on UE support                            Communication Communication Communication: No apparent difficulties  Cognition Arousal: Alert Behavior During Therapy: WFL for tasks assessed/performed   PT - Cognitive impairments: No apparent impairments                         Following commands: Intact      Cueing Cueing Techniques: Verbal cues  Exercises Total Joint Exercises Long Arc Quad: AAROM, Right, Seated (Eccentric lower) Knee Flexion: 10 reps, Seated, AROM, Both Goniometric ROM: L knee: 55, [name]: 74 (of note, knee immobilizer on L side throughout session and ROM taken at end of session)    General Comments General comments (skin integrity, edema, etc.): Wife and daughter present, hands on to assist and assisted with chair follow      Pertinent Vitals/Pain Pain Assessment Pain Assessment: Faces Pain Score: 6  Faces Pain Scale: Hurts little more Pain Location: BLE with knee flexion and STS Pain Descriptors / Indicators: Discomfort, Grimacing, Operative site guarding, Sharp Pain Intervention(s): Limited activity within patient's tolerance, Monitored during session, Repositioned    Home Living                          Prior Function            PT Goals (current goals can now be found in the care plan section) Acute Rehab PT Goals Patient Stated Goal: to return to independence PT Goal Formulation: With patient/family Time For Goal Achievement: 05/03/24 Potential to Achieve Goals: Good Progress towards PT goals: Progressing toward goals    Frequency    7X/week      PT Plan      Co-evaluation PT/OT/SLP Co-Evaluation/Treatment:  Yes Reason for Co-Treatment: Complexity of the patient's impairments (multi-system involvement);For patient/therapist safety;To address functional/ADL transfers PT goals addressed during session: Mobility/safety with mobility;Balance;Proper use of DME;Strengthening/ROM OT goals addressed during session: ADL's and self-care;Proper use of Adaptive equipment and DME      AM-PAC PT 6 Clicks Mobility   Outcome Measure  Help needed turning from your back to your side while in a flat bed without using bedrails?: A Little Help needed moving from lying on your back to sitting on the side of a flat bed without using bedrails?: A Little Help needed moving to and from a bed to a chair (including a wheelchair)?: A Lot Help needed standing up from a chair using your arms (e.g., wheelchair or bedside chair)?: A Lot Help needed to walk in hospital room?: A Lot Help needed climbing 3-5 steps with a railing? : Total 6 Click Score: 13    End of Session Equipment Utilized During Treatment: Gait belt;Left knee immobilizer Activity Tolerance: Patient tolerated treatment well Patient left: with family/visitor present;in chair;with call bell/phone within reach;with chair alarm set Nurse Communication: Mobility status;Precautions;Need for lift equipment PT Visit Diagnosis: Other abnormalities of gait and mobility (R26.89);Muscle weakness (generalized) (M62.81);Pain Pain - Right/Left: Right Pain - part of body: Knee     Time: 8669-8573 PT Time Calculation (min) (ACUTE ONLY):  56 min  Charges:    $Gait Training: 23-37 mins $Therapeutic Exercise: 8-22 mins $Therapeutic Activity: 8-22 mins PT General Charges $$ ACUTE PT VISIT: 1 Visit                     Leita Sable, PT, DPT Acute Rehabilitation Services Secure Chat Preferred Office: [phone]    Leita JONETTA Sable 04/21/2024, 2:54 PM

## 2024-04-21 NOTE — H&P (Signed)
 Physical Medicine and Rehabilitation Admission H&P    No chief complaint on file. : HPI: ***  ROS Past Medical History:  Diagnosis Date   Arthritis    Past Surgical History:  Procedure Laterality Date   HARDWARE REMOVAL Right 04/14/2024   Procedure: REMOVAL, HARDWARE;  Surgeon: Addie Cordella Hamilton, MD;  Location: [facility] OR;  Service: Orthopedics;  Laterality: Right;   KNEE SURGERY Bilateral    arthroscopy on each knee, and reconstruction on right knee   LAPAROSCOPY N/A 09/18/2023   Procedure: DIAGNOSTIC LAPAROSCOPY;  Surgeon: Ebbie Cough, MD;  Location: [facility] OR;  Service: General;  Laterality: N/A;   SHOULDER SURGERY Left    repaired humerus (shoulder kept dislocating)   TOTAL KNEE ARTHROPLASTY Bilateral 04/14/2024   Procedure: ARTHROPLASTY, KNEE, BILATERAL, TOTAL;  Surgeon: Addie Cordella Hamilton, MD;  Location: [facility] OR;  Service: Orthopedics;  Laterality: Bilateral;   UMBILICAL HERNIA REPAIR N/A 09/18/2023   Procedure: UMBILICAL HERNIA REPAIR;  Surgeon: Ebbie Cough, MD;  Location: [facility] OR;  Service: General;  Laterality: N/A;  RNFA GEN & TAP BLOCK   Family History  Problem Relation Age of Onset   Hypertension Mother    Irregular heart beat Mother    Osteoarthritis Mother        Knees   Diabetes Father    Hypertension Father    Other Father        Elevated LFT   Prostate cancer Father    Cancer Father    Leukemia Sister    Cancer Sister    Arrhythmia Brother    Hyperlipidemia Brother    Hypertension Brother    Prostate cancer Maternal Uncle    Cancer Maternal Uncle    Prostate cancer Paternal Uncle    Cancer Paternal Uncle    Social History:  reports that he has never smoked. He has never used smokeless tobacco. He reports that he does not drink alcohol and does not use drugs. Allergies:  Allergies  Allergen Reactions   Shellfish Allergy Hives   Medications Prior to Admission  Medication Sig Dispense Refill   Ascorbic Acid (VITAMIN C PO) Take 1 tablet by  mouth at bedtime.     b complex vitamins capsule Take 1 capsule by mouth at bedtime.     Cholecalciferol (VITAMIN D -3 PO) Take 1 tablet by mouth at bedtime.     Collagen-Vitamin C-Biotin (COLLAGEN PO) Take 1 capsule by mouth at bedtime.     CREATINE PO Take 1 Scoop by mouth at bedtime.     MAGNESIUM  PO Take 1 capsule by mouth at bedtime.     POTASSIUM PO Take 1 capsule by mouth at bedtime.     [DISCONTINUED] celecoxib  (CELEBREX ) 200 MG capsule Take 1 capsule (200 mg total) by mouth 2 (two) times daily as needed. (Patient taking differently: Take 400 mg by mouth in the morning and at bedtime.) 60 capsule 6      Home: Home Living Family/patient expects to be discharged to:: Private residence Living Arrangements: Spouse/significant other Available Help at Discharge: Family Type of Home: Apartment Home Access: Elevator, Level entry Home Layout: One level Bathroom Shower/Tub: Health visitor: Standard Home Equipment: None Additional Comments: has two grown children who live in other parts of the state   Functional History: Prior Function Prior Level of Function : Independent/Modified Independent, Working/employed, Driving Mobility Comments: Ind without an AD ADLs Comments: Ind with ADLs and IADLs; works for [employer]; drives; 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 Pt with heavy use of UE's and rails for support. Increased time required for transition fully to EOB. Bed pad utilized to assist with getting feet on the floor. Transfers Overall transfer level: Needs assistance Equipment used: Rolling walker (2 wheels) Transfers: Sit to/from  Stand, Bed to chair/wheelchair/BSC Sit to Stand: Max assist, From elevated surface, Via lift equipment, +2 physical assistance Bed to/from chair/wheelchair/BSC transfer type:: Step pivot Step pivot transfers: Min assist, +2 safety/equipment Transfer via Lift Equipment: Stedy General transfer comment: Heavy assist to power up to full stand from elevated surface. Increased time to gain/maintain standing balance. Pt able to step-pivot from sink to toilet, mainly by backing up. Ambulation/Gait Ambulation/Gait assistance: Min assist, +2 safety/equipment, +2 physical assistance (third person helpful for chair follow) Gait Distance (Feet): 75 Feet Assistive device: Rolling walker (2 wheels) Gait Pattern/deviations: Shuffle, Trunk flexed, Antalgic, Step-through pattern, Decreased stride length General Gait Details: Pt motivated for distance. He was able to improve step/stride length, heel strike, and overall fluidity of walker movement. Gait velocity: Decreased Gait velocity interpretation: <1.31 ft/sec, indicative of household ambulator Pre-gait activities: standing weight shift to L/R sides in [place] platform a few reps but not able to take steps 2/2 orthostatic hypotension symptoms    ADL: ADL Overall ADL's : Needs assistance/impaired Eating/Feeding: Independent, Bed level Grooming: Contact guard assist, Standing, Wash/dry face Grooming Details (indicate cue type and reason): washing face, drying hands standing at sink. able to briefly stand without UE support but reports not fully trusting his knees to hold him up yet Upper Body Bathing: Set up, Bed level Lower Body Bathing: Moderate assistance, Maximal assistance, Cueing for compensatory techniques, Bed level Upper Body Dressing : Independent, Bed level Lower Body Dressing: Total assistance, Bed level Lower Body Dressing Details (indicate cue type and reason): sock mgmt Toilet Transfer: Contact guard assist, Maximal assistance, +2 for  physical assistance, +2 for safety/equipment, Ambulation, BSC/3in1, Rolling walker (2 wheels), Regular Toilet Toilet Transfer Details (indicate cue type and reason): BSC over toilet to highest height. Pt able to mobilize to/from bathroom with RW with CGA +2 for safety. Max A x 2 to stand from [facility] over toilet with one hand on grab bar and one hand on RW. assist to bring LLE back (R KI on during this session) as able Toileting- Clothing Manipulation and Hygiene: Set up, Maximal assistance, Bed level Toileting - Clothing Manipulation Details (indicate cue type and reason): Set up for use of hand held urinal and Max assist for peri care from bed level Functional mobility during ADLs: Contact guard assist, +2 for safety/equipment, Rolling walker (2 wheels) General ADL Comments: Pt functional level limited by increased HR in sitting and standing this session.  Cognition: Cognition Orientation Level: Oriented X4 Cognition Arousal: Alert Behavior During Therapy: WFL for tasks assessed/performed  Physical Exam: Blood pressure (!) 144/69, pulse (!) 106, temperature 99.5 F (37.5 C), temperature source Oral, resp. rate 16, height 5' 11.5 (1.816 m), weight 117.9 kg, SpO2 99%. Physical Exam  Results for orders placed or performed during the hospital encounter of 04/14/24 (from the past 48 hours)  Basic metabolic panel     Status: Abnormal   Collection Time: 04/20/24 12:09 PM  Result Value Ref Range  Sodium 128 (L) 135 - 145 mmol/L   Potassium 4.0 3.5 - 5.1 mmol/L   Chloride 92 (L) 98 - 111 mmol/L   CO2 24 22 - 32 mmol/L   Glucose, Bld 133 (H) 70 - 99 mg/dL    Comment: Glucose reference range applies only to samples taken after fasting for at least 8 hours.   BUN 18 6 - 20 mg/dL   Creatinine, Ser 8.84 0.61 - 1.24 mg/dL   Calcium  8.2 (L) 8.9 - 10.3 mg/dL   GFR, Estimated >39 >39 mL/min    Comment: (NOTE) Calculated using the CKD-EPI Creatinine Equation (2021)    Anion gap 12 5 - 15     Comment: Performed at [facility], [address], [place], [place] [address]   CT Angio Chest Pulmonary Embolism (PE) W or WO Contrast Addendum Date: 04/20/2024 ADDENDUM REPORT: 04/20/2024 16:12 ADDENDUM: Critical Value/emergent results were called by telephone at the time of interpretation on 04/20/2024 at 4:11 pm to provider COSTIN GHERGHE , who verbally acknowledged these results. Electronically Signed   By: Leita Birmingham M.D.   On: 04/20/2024 16:12   Result Date: 04/20/2024 CLINICAL DATA:  Pulmonary embolism suspected, high probability. EXAM: CT ANGIOGRAPHY CHEST WITH CONTRAST TECHNIQUE: Multidetector CT imaging of the chest was performed using the standard protocol during bolus administration of intravenous contrast. Multiplanar CT image reconstructions and MIPs were obtained to evaluate the vascular anatomy. RADIATION DOSE REDUCTION: This exam was performed according to the departmental dose-optimization program which includes automated exposure control, adjustment of the mA and/or kV according to patient size and/or use of iterative reconstruction technique. CONTRAST:  75mL OMNIPAQUE  IOHEXOL  350 MG/ML SOLN COMPARISON:  None Available. FINDINGS: Cardiovascular: The heart is normal in size and there is no pericardial effusion. There is atherosclerotic calcification of the aorta without evidence of aneurysm. The pulmonary trunk is normal in caliber. Examination is limited due to mixing artifact and respiratory motion. Segmental and subsegmental pulmonary emboli are present in the right lower lobe. No definite evidence of right heart strain. Mediastinum/Nodes: No enlarged mediastinal, hilar, or axillary lymph nodes. Thyroid  gland, trachea, and esophagus demonstrate no significant findings. Lungs/Pleura: Mild atelectasis is noted bilaterally. No effusion or pneumothorax. Upper Abdomen: No acute abnormality. Musculoskeletal: No acute osseous abnormality. Review of the MIP images confirms the above  findings. IMPRESSION: 1. Segmental and subsegmental pulmonary emboli in the right lower lobe. No evidence of right heart strain. 2. Aortic atherosclerosis. Electronically Signed: By: Leita Birmingham M.D. On: 04/20/2024 15:56   ECHOCARDIOGRAM COMPLETE Result Date: 04/19/2024    ECHOCARDIOGRAM REPORT   Patient Name:   RONNY KORFF Date of Exam: 04/19/2024 Medical Rec #:  [id number]         Height:       71.5 in Accession #:    [id number]        Weight:       260.0 lb Date of Birth:  19-Jul-1971         BSA:          2.370 m Patient Age:    52 years          BP:           137/63 mmHg Patient Gender: M                 HR:           117 bpm. Exam Location:  Inpatient Procedure: 2D Echo, Cardiac Doppler and Color Doppler (Both Spectral  and Color            Flow Doppler were utilized during procedure). Indications:    Abnormal ECG R94.31  History:        Patient has no prior history of Echocardiogram examinations.                 Arrythmias:Tachycardia.  Sonographer:    Thea Norlander RCS Referring Phys: [phone] NILDA HERO GHERGHE IMPRESSIONS  1. Left ventricular ejection fraction, by estimation, is 65 to 70%. The left ventricle has normal function. The left ventricle has no regional wall motion abnormalities. Left ventricular diastolic parameters were normal.  2. Right ventricular systolic function is hyperdynamic. The right ventricular size is mildly enlarged.  3. The mitral valve is normal in structure. No evidence of mitral valve regurgitation. No evidence of mitral stenosis.  4. The aortic valve is tricuspid. Aortic valve regurgitation is not visualized. No aortic stenosis is present.  5. Aortic dilatation noted. There is mild dilatation of the aortic root, measuring 40 mm.  6. The inferior vena cava is normal in size with greater than 50% respiratory variability, suggesting right atrial pressure of 3 mmHg. Comparison(s): No prior Echocardiogram. FINDINGS  Left Ventricle: Left ventricular ejection fraction, by  estimation, is 65 to 70%. The left ventricle has normal function. The left ventricle has no regional wall motion abnormalities. The left ventricular internal cavity size was normal in size. There is  no left ventricular hypertrophy. Left ventricular diastolic parameters were normal. Right Ventricle: The right ventricular size is mildly enlarged. No increase in right ventricular wall thickness. Right ventricular systolic function is hyperdynamic. Left Atrium: Left atrial size was normal in size. Right Atrium: Right atrial size was normal in size. Pericardium: There is no evidence of pericardial effusion. Mitral Valve: The mitral valve is normal in structure. Mild mitral annular calcification. No evidence of mitral valve regurgitation. No evidence of mitral valve stenosis. Tricuspid Valve: The tricuspid valve is normal in structure. Tricuspid valve regurgitation is not demonstrated. No evidence of tricuspid stenosis. Aortic Valve: The aortic valve is tricuspid. Aortic valve regurgitation is not visualized. No aortic stenosis is present. Aortic valve peak gradient measures 9.7 mmHg. Pulmonic Valve: The pulmonic valve was normal in structure. Pulmonic valve regurgitation is not visualized. No evidence of pulmonic stenosis. Aorta: Aortic dilatation noted. There is mild dilatation of the aortic root, measuring 40 mm. Venous: The inferior vena cava is normal in size with greater than 50% respiratory variability, suggesting right atrial pressure of 3 mmHg. IAS/Shunts: The interatrial septum appears to be lipomatous. No atrial level shunt detected by color flow Doppler.  LEFT VENTRICLE PLAX 2D LVIDd:         4.40 cm   Diastology LVIDs:         2.80 cm   LV e' medial:    10.10 cm/s LV PW:         1.10 cm   LV E/e' medial:  7.8 LV IVS:        1.00 cm   LV e' lateral:   16.90 cm/s LVOT diam:     2.40 cm   LV E/e' lateral: 4.7 LV SV:         70 LV SV Index:   29 LVOT Area:     4.52 cm  LEFT ATRIUM           Index        RIGHT  ATRIUM  Index LA diam:      3.50 cm 1.48 cm/m   RA Area:     10.90 cm LA Vol (A2C): 41.4 ml 17.47 ml/m  RA Volume:   17.30 ml  7.30 ml/m LA Vol (A4C): 36.7 ml 15.49 ml/m  AORTIC VALVE AV Area (Vmax): 3.48 cm AV Vmax:        156.00 cm/s AV Peak Grad:   9.7 mmHg LVOT Vmax:      120.00 cm/s LVOT Vmean:     74.600 cm/s LVOT VTI:       0.154 m  AORTA Ao Root diam: 4.00 cm Ao Asc diam:  3.60 cm MITRAL VALVE MV Area (PHT): 5.02 cm    SHUNTS MV Decel Time: 151 msec    Systemic VTI:  0.15 m MV E velocity: 78.60 cm/s  Systemic Diam: 2.40 cm MV A velocity: 95.40 cm/s MV E/A ratio:  0.82 Stanly Leavens MD Electronically signed by Stanly Leavens MD Signature Date/Time: 04/19/2024/2:25:06 PM    Final       Blood pressure (!) 144/69, pulse (!) 106, temperature 99.5 F (37.5 C), temperature source Oral, resp. rate 16, height 5' 11.5 (1.816 m), weight 117.9 kg, SpO2 99%.  Medical Problem List and Plan: 1. Functional deficits secondary to ***  -patient may *** shower  -ELOS/Goals: *** 2.  Acute DVT/PE/Antithrombotics: -DVT/anticoagulation:  Pharmaceutical: Xarelto   -antiplatelet therapy: *** 3. Pain Management: *** 4. Mood/Behavior/Sleep: ***  -antipsychotic agents: *** 5. Neuropsych/cognition: This patient *** capable of making decisions on *** own behalf. 6. Skin/Wound Care: *** 7. Fluids/Electrolytes/Nutrition: *** 8. 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 Addie 9. Orthostatic hypotension: ABLA: Hyponatremia/Hypochloremia: Sodium/CL trending down question poor intake --started on FR    Leucocytosis: Monitor WBC as rising from 4.7-->14.4  --monitor for fevers and other signs of infection.   ABLA: Recheck in am.     BPH:  ***  Sharlet GORMAN Schmitz, PA-C 04/21/2024

## 2024-04-21 NOTE — Plan of Care (Signed)

## 2024-04-21 NOTE — Progress Notes (Signed)
 Inpatient Rehab Admissions Coordinator:   Sent MD note with update on PE to insurance this AM.    Reche Lowers, PT, DPT Admissions Coordinator [phone] 04/21/24  12:50 PM

## 2024-04-21 NOTE — Progress Notes (Signed)
 Physical Therapy Treatment  Patient Details Name: Roger Pace MRN: [id number] DOB: [birth year] Today's Date: 04/21/2024   History of Present Illness Pt is a 53 y.o. male who presents to [facility] hospital on 04/14/2024 for elective bilateral TKA. Post-op, was found to have fevers and on further workup was found to have acute bilateral LE DVT. Pt with orthostatic hypotension and tachycardia in following PT sessions. Due to persistent tachycardia, eventually underwent a CT angiogram which also showed PE on 04/20/2024. PMH includes umbilical hernia repair, OA.    PT Comments  Pt progressing towards physical therapy goals. Saw in conjunction with OT to maximize functional potential. He was able to progress OOB mobility this session to ADL tasks at the sink, demonstrating the ability to stand with single extremity support and then progressing to brief (<10 second) stand with no UE support. Pt continues to have difficulty with [name] flexion and quad activation. Will continue to follow.    If plan is discharge home, recommend the following: A little help with walking and/or transfers;A little help with bathing/dressing/bathroom;Assistance with cooking/housework;Assist for transportation;Help with stairs or ramp for entrance   Can travel by private vehicle        Equipment Recommendations  Rolling walker (2 wheels);BSC/3in1    Recommendations for Other Services Rehab consult;OT consult     Precautions / Restrictions Precautions Precautions: Fall;Knee Recall of Precautions/Restrictions: Intact Precaution/Restrictions Comments: Pt educated on need for knee extension at this time. Bed locked in extension, and education provided that NO pillow/roll/ice pack should be placed under the knee. Required Braces or Orthoses: Knee Immobilizer - Left Knee Immobilizer - Right: Other (comment) (per Dr. Addie, try to alternate knee immobilizer between legs to allow each leg the chance to bear more of the load when  mobilizing) Knee Immobilizer - Left: Other (comment) (see above) Restrictions Weight Bearing Restrictions Per Provider Order: Yes RLE Weight Bearing Per Provider Order: Weight bearing as tolerated LLE Weight Bearing Per Provider Order: Weight bearing as tolerated     Mobility  Bed Mobility Overal bed mobility: Needs Assistance Bed Mobility: Supine to Sit     Supine to sit: HOB elevated, Min assist, Used rails, +2 for physical assistance, +2 for safety/equipment Sit to supine: Used rails, Mod assist   General bed mobility comments: Assist for BLE advancement towards EOB. Pt with heavy use of UE's and rails for support. Increased time required for transition fully to EOB. Bed pad utilized to assist with getting feet on the floor.    Transfers Overall transfer level: Needs assistance Equipment used: Rolling walker (2 wheels) Transfers: Sit to/from Stand, Bed to chair/wheelchair/BSC Sit to Stand: Max assist, From elevated surface, Via lift equipment, +2 physical assistance   Step pivot transfers: Min assist, +2 safety/equipment       General transfer comment: Heavy assist to power up to full stand from elevated surface. Increased time to gain/maintain standing balance. Pt able to step-pivot from sink to toilet, mainly by backing up.    Ambulation/Gait Ambulation/Gait assistance: Min assist, +2 safety/equipment, +2 physical assistance (third person helpful for chair follow) Gait Distance (Feet): 75 Feet Assistive device: Rolling walker (2 wheels) Gait Pattern/deviations: Shuffle, Trunk flexed, Antalgic, Step-through pattern, Decreased stride length Gait velocity: Decreased Gait velocity interpretation: <1.31 ft/sec, indicative of household ambulator   General Gait Details: Pt motivated for distance. He was able to improve step/stride length, heel strike, and overall fluidity of walker movement.   Stairs  Wheelchair Mobility     Tilt Bed    Modified  Rankin (Stroke Patients Only)       Balance Overall balance assessment: Needs assistance Sitting-balance support: No upper extremity supported, Feet supported Sitting balance-Leahy Scale: Good     Standing balance support: Bilateral upper extremity supported, During functional activity, Reliant on assistive device for balance Standing balance-Leahy Scale: Poor Standing balance comment: reliant on UE support                            Communication Communication Communication: No apparent difficulties  Cognition Arousal: Alert Behavior During Therapy: WFL for tasks assessed/performed   PT - Cognitive impairments: No apparent impairments                         Following commands: Intact      Cueing Cueing Techniques: Verbal cues  Exercises Total Joint Exercises Knee Flexion: 10 reps, Seated, AROM    General Comments General comments (skin integrity, edema, etc.): Wife and daughter present, hands on to assist and assisted with chair follow      Pertinent Vitals/Pain Pain Assessment Pain Assessment: 0-10 Pain Score: 6  Pain Location: BLE with knee flexion and STS Pain Descriptors / Indicators: Discomfort, Grimacing, Operative site guarding, Sharp Pain Intervention(s): Monitored during session, Limited activity within patient's tolerance, Repositioned    Home Living                          Prior Function            PT Goals (current goals can now be found in the care plan section) Acute Rehab PT Goals Patient Stated Goal: to return to independence PT Goal Formulation: With patient/family Time For Goal Achievement: 05/03/24 Potential to Achieve Goals: Good Progress towards PT goals: Progressing toward goals    Frequency    7X/week      PT Plan      Co-evaluation PT/OT/SLP Co-Evaluation/Treatment: Yes Reason for Co-Treatment: Complexity of the patient's impairments (multi-system involvement);For patient/therapist  safety;To address functional/ADL transfers PT goals addressed during session: Mobility/safety with mobility;Balance;Proper use of DME;Strengthening/ROM OT goals addressed during session: ADL's and self-care;Proper use of Adaptive equipment and DME      AM-PAC PT 6 Clicks Mobility   Outcome Measure  Help needed turning from your back to your side while in a flat bed without using bedrails?: A Little Help needed moving from lying on your back to sitting on the side of a flat bed without using bedrails?: A Little Help needed moving to and from a bed to a chair (including a wheelchair)?: A Lot Help needed standing up from a chair using your arms (e.g., wheelchair or bedside chair)?: A Lot Help needed to walk in hospital room?: A Lot Help needed climbing 3-5 steps with a railing? : Total 6 Click Score: 13    End of Session Equipment Utilized During Treatment: Gait belt;Right knee immobilizer Activity Tolerance: Patient tolerated treatment well Patient left: with family/visitor present;in chair;with call bell/phone within reach;with chair alarm set Nurse Communication: Mobility status;Precautions;Need for lift equipment PT Visit Diagnosis: Other abnormalities of gait and mobility (R26.89);Muscle weakness (generalized) (M62.81);Pain Pain - Right/Left: Right Pain - part of body: Knee     Time: 9045-8951 PT Time Calculation (min) (ACUTE ONLY): 54 min  Charges:    $Gait Training: 23-37 mins PT General Charges $$ ACUTE PT  VISIT: 1 Visit                     Leita Sable, PT, DPT Acute Rehabilitation Services Secure Chat Preferred Office: [phone]    Leita JONETTA Sable 04/21/2024, 11:37 AM

## 2024-04-22 ENCOUNTER — Encounter (HOSPITAL_COMMUNITY): Payer: Self-pay | Admitting: Orthopedic Surgery

## 2024-04-22 ENCOUNTER — Other Ambulatory Visit (HOSPITAL_COMMUNITY): Payer: Self-pay

## 2024-04-22 ENCOUNTER — Inpatient Hospital Stay (HOSPITAL_COMMUNITY)
Admission: AD | Admit: 2024-04-22 | Discharge: 2024-05-01 | DRG: 559 | Disposition: A | Discharge status: Home Health | Source: Intra-hospital | Attending: Physical Medicine & Rehabilitation | Admitting: Physical Medicine & Rehabilitation

## 2024-04-22 DIAGNOSIS — D72829 Elevated white blood cell count, unspecified: Secondary | ICD-10-CM

## 2024-04-22 DIAGNOSIS — M170 Bilateral primary osteoarthritis of knee: Secondary | ICD-10-CM

## 2024-04-22 DIAGNOSIS — D75839 Thrombocytosis, unspecified: Secondary | ICD-10-CM | POA: Diagnosis not present

## 2024-04-22 DIAGNOSIS — M179 Osteoarthritis of knee, unspecified: Principal | ICD-10-CM | POA: Diagnosis present

## 2024-04-22 DIAGNOSIS — K5900 Constipation, unspecified: Secondary | ICD-10-CM | POA: Diagnosis present

## 2024-04-22 DIAGNOSIS — I951 Orthostatic hypotension: Secondary | ICD-10-CM | POA: Diagnosis not present

## 2024-04-22 DIAGNOSIS — D72823 Leukemoid reaction: Secondary | ICD-10-CM | POA: Diagnosis not present

## 2024-04-22 DIAGNOSIS — R7301 Impaired fasting glucose: Secondary | ICD-10-CM | POA: Diagnosis present

## 2024-04-22 DIAGNOSIS — E871 Hypo-osmolality and hyponatremia: Secondary | ICD-10-CM | POA: Diagnosis not present

## 2024-04-22 DIAGNOSIS — Z7901 Long term (current) use of anticoagulants: Secondary | ICD-10-CM | POA: Diagnosis not present

## 2024-04-22 DIAGNOSIS — Z96653 Presence of artificial knee joint, bilateral: Secondary | ICD-10-CM | POA: Diagnosis present

## 2024-04-22 DIAGNOSIS — I2699 Other pulmonary embolism without acute cor pulmonale: Secondary | ICD-10-CM | POA: Diagnosis present

## 2024-04-22 DIAGNOSIS — I82403 Acute embolism and thrombosis of unspecified deep veins of lower extremity, bilateral: Secondary | ICD-10-CM | POA: Diagnosis present

## 2024-04-22 DIAGNOSIS — R509 Fever, unspecified: Secondary | ICD-10-CM | POA: Diagnosis not present

## 2024-04-22 DIAGNOSIS — D62 Acute posthemorrhagic anemia: Secondary | ICD-10-CM | POA: Diagnosis present

## 2024-04-22 DIAGNOSIS — Z4789 Encounter for other orthopedic aftercare: Principal | ICD-10-CM

## 2024-04-22 LAB — BASIC METABOLIC PANEL WITH GFR
Anion gap: 10 (ref 5–15)
BUN: 15 mg/dL (ref 6–20)
CO2: 25 mmol/L (ref 22–32)
Calcium: 8.2 mg/dL — ABNORMAL LOW (ref 8.9–10.3)
Chloride: 96 mmol/L — ABNORMAL LOW (ref 98–111)
Creatinine, Ser: 0.99 mg/dL (ref 0.61–1.24)
GFR, Estimated: >60 mL/min (ref >60)
Glucose, Bld: 115 mg/dL — ABNORMAL HIGH (ref 70–99)
Potassium: 4.3 mmol/L (ref 3.5–5.1)
Sodium: 131 mmol/L — ABNORMAL LOW (ref 135–145)

## 2024-04-22 MED ORDER — POLYETHYLENE GLYCOL 3350 17 G PO PACK
17.0000 g | PACK | Freq: Two times a day (BID) | ORAL | Status: DC
  Administered 2024-04-22: 17 g via ORAL
  Filled 2024-04-22: qty 1

## 2024-04-22 MED ORDER — RIVAROXABAN 20 MG PO TABS: 20.0000 mg | ORAL_TABLET | Freq: Every day | ORAL | Status: DC

## 2024-04-22 MED ORDER — FLEET ENEMA RE ENEM: 1.0000 | ENEMA | Freq: Once | RECTAL | Status: DC | PRN

## 2024-04-22 MED ORDER — ACETAMINOPHEN 325 MG PO TABS
325.0000 mg | ORAL_TABLET | ORAL | Status: DC | PRN
  Administered 2024-04-23 – 2024-04-30 (×2): 650 mg via ORAL
  Filled 2024-04-22 (×2): qty 2

## 2024-04-22 MED ORDER — PROCHLORPERAZINE 25 MG RE SUPP: 12.5000 mg | Freq: Four times a day (QID) | RECTAL | Status: DC | PRN

## 2024-04-22 MED ORDER — SENNOSIDES-DOCUSATE SODIUM 8.6-50 MG PO TABS
2.0000 | ORAL_TABLET | Freq: Two times a day (BID) | ORAL | Status: DC
  Administered 2024-04-22 – 2024-04-23 (×2): 2 via ORAL
  Filled 2024-04-22 (×2): qty 2

## 2024-04-22 MED ORDER — PHENOL 1.4 % MT LIQD: 1.0000 | OROMUCOSAL | Status: DC | PRN

## 2024-04-22 MED ORDER — DIPHENHYDRAMINE HCL 25 MG PO CAPS: 25.0000 mg | ORAL_CAPSULE | Freq: Four times a day (QID) | ORAL | Status: DC | PRN

## 2024-04-22 MED ORDER — CALCIUM CARBONATE ANTACID 500 MG PO CHEW: 1.0000 | CHEWABLE_TABLET | Freq: Three times a day (TID) | ORAL | Status: DC | PRN

## 2024-04-22 MED ORDER — METHOCARBAMOL 750 MG PO TABS
750.0000 mg | ORAL_TABLET | Freq: Four times a day (QID) | ORAL | Status: DC
  Administered 2024-04-22 – 2024-05-01 (×35): 750 mg via ORAL
  Filled 2024-04-22 (×35): qty 1

## 2024-04-22 MED ORDER — MAGNESIUM HYDROXIDE 400 MG/5ML PO SUSP: 45.0000 mL | Freq: Every day | ORAL | Status: DC | PRN

## 2024-04-22 MED ORDER — PROCHLORPERAZINE EDISYLATE 10 MG/2ML IJ SOLN: 5.0000 mg | Freq: Four times a day (QID) | INTRAMUSCULAR | Status: DC | PRN

## 2024-04-22 MED ORDER — MELATONIN 5 MG PO TABS: 5.0000 mg | ORAL_TABLET | Freq: Every evening | ORAL | Status: DC | PRN

## 2024-04-22 MED ORDER — GABAPENTIN 100 MG PO CAPS
200.0000 mg | ORAL_CAPSULE | Freq: Three times a day (TID) | ORAL | Status: DC
  Administered 2024-04-22 – 2024-05-01 (×26): 200 mg via ORAL
  Filled 2024-04-22 (×27): qty 2

## 2024-04-22 MED ORDER — OXYCODONE HCL 5 MG PO TABS
5.0000 mg | ORAL_TABLET | ORAL | Status: DC | PRN
  Administered 2024-04-23: 5 mg via ORAL
  Administered 2024-04-23 – 2024-04-24 (×2): 10 mg via ORAL
  Administered 2024-04-24: 5 mg via ORAL
  Administered 2024-04-24: 10 mg via ORAL
  Administered 2024-04-25: 5 mg via ORAL
  Administered 2024-04-25 – 2024-04-26 (×4): 10 mg via ORAL
  Administered 2024-04-27: 5 mg via ORAL
  Administered 2024-04-27 – 2024-04-28 (×4): 10 mg via ORAL
  Administered 2024-04-29: 5 mg via ORAL
  Administered 2024-04-29 – 2024-05-01 (×4): 10 mg via ORAL
  Filled 2024-04-22: qty 2
  Filled 2024-04-22: qty 1
  Filled 2024-04-22 (×4): qty 2
  Filled 2024-04-22: qty 1
  Filled 2024-04-22 (×9): qty 2
  Filled 2024-04-22: qty 1
  Filled 2024-04-22: qty 2
  Filled 2024-04-22: qty 1
  Filled 2024-04-22: qty 2

## 2024-04-22 MED ORDER — MENTHOL 3 MG MT LOZG: 1.0000 | LOZENGE | OROMUCOSAL | Status: DC | PRN

## 2024-04-22 MED ORDER — BISACODYL 10 MG RE SUPP: 10.0000 mg | Freq: Every day | RECTAL | Status: DC | PRN

## 2024-04-22 MED ORDER — SIMETHICONE 80 MG PO CHEW: 80.0000 mg | CHEWABLE_TABLET | Freq: Four times a day (QID) | ORAL | Status: DC | PRN

## 2024-04-22 MED ORDER — POLYETHYLENE GLYCOL 3350 17 G PO PACK
17.0000 g | PACK | Freq: Every day | ORAL | Status: DC
  Administered 2024-04-27 – 2024-04-28 (×2): 17 g via ORAL
  Filled 2024-04-22 (×6): qty 1

## 2024-04-22 MED ORDER — RIVAROXABAN 15 MG PO TABS
15.0000 mg | ORAL_TABLET | Freq: Two times a day (BID) | ORAL | Status: DC
  Administered 2024-04-22 – 2024-05-01 (×18): 15 mg via ORAL
  Filled 2024-04-22 (×18): qty 1

## 2024-04-22 MED ORDER — GUAIFENESIN-DM 100-10 MG/5ML PO SYRP: 5.0000 mL | ORAL_SOLUTION | Freq: Four times a day (QID) | ORAL | Status: DC | PRN

## 2024-04-22 MED ORDER — B COMPLEX-C PO TABS
1.0000 | ORAL_TABLET | Freq: Every day | ORAL | Status: DC
  Administered 2024-04-22 – 2024-04-30 (×9): 1 via ORAL
  Filled 2024-04-22 (×9): qty 1

## 2024-04-22 MED ORDER — PROCHLORPERAZINE MALEATE 5 MG PO TABS: 5.0000 mg | ORAL_TABLET | Freq: Four times a day (QID) | ORAL | Status: DC | PRN

## 2024-04-22 NOTE — Plan of Care (Signed)
  Problem: RH SKIN INTEGRITY Goal: RH STG SKIN FREE OF INFECTION/BREAKDOWN Description: Manage skin free of infection / breakdown with mod I assistance Outcome: Progressing   Problem: RH SAFETY Goal: RH STG ADHERE TO SAFETY PRECAUTIONS W/ASSISTANCE/DEVICE Description: STG Adhere to Safety Precautions With mod I  Assistance/Device. Outcome: Progressing   Problem: RH PAIN MANAGEMENT Goal: RH STG PAIN MANAGED AT OR BELOW PT'S PAIN GOAL Description: <4 w/ prns Outcome: Progressing

## 2024-04-22 NOTE — Progress Notes (Signed)
 Orthopedic Tech Progress Note Patient Details:  APOLO CUTSHAW November 09, 1970 [id number]  CPM Left Knee CPM Left Knee: Off Left Knee Flexion (Degrees): 40 Left Knee Extension (Degrees): 10 CPM Right Knee CPM Right Knee: On Right Knee Flexion (Degrees): 40 Right Knee Extension (Degrees): 10  Post Interventions Patient Tolerated: Well  Adine MARLA Blush 04/22/2024, 4:41 PM

## 2024-04-22 NOTE — Progress Notes (Signed)
 Inpatient Rehab Admissions Coordinator:    I have insurance approval and a bed available for pt to admit to CIR today. Dr. Addie in agreement and [facility] aware.  I've notified pt/family and they are in agreement to admit today.  I will make arrangements.    Reche Lowers, PT, DPT Admissions Coordinator [phone] 04/22/24  9:52 AM

## 2024-04-22 NOTE — Plan of Care (Signed)

## 2024-04-22 NOTE — Progress Notes (Signed)
 Butler Reche BRAVO, PT Rehab Admission Coordinator Physical Medicine and Rehabilitation   PMR Pre-admission    Addendum   Date of Service: 04/20/2024 11:37 AM  Related encounter: Admission (Current) from 04/14/2024 in [facility]   Expand All Collapse All  PMR Admission Coordinator Pre-Admission Assessment   Patient: Roger Pace is an 53 y.o., male MRN: [id number] DOB: Jan 04, 1971 Height: 5' 11.5 (181.6 cm) Weight: 117.9 kg                                                                                                                                                  Insurance Information HMO:     PPO:      PCP:      IPA:      80/20:      OTHER:  PRIMARY: U[employer]      Policy#: [id number]       Subscriber: pt CM Name: Tish      Phone#: not provided     Fax#: [phone] Pre-Cert#: [id number] auth for CIR from Tish with [facility] Medicare for admit 9/3 through 9/9.  Updates due to Philippe RAMAN (phone [phone] ext [phone]) at fax listed above.        Employer:  Benefits:  Phone #: [phone]     Name:  Eff. Date: 08/21/23     Deduct: $500 ($500 met)      Out of Pocket Max: $2500 ($2500 met)      Life Max:   CIR: 80%      SNF: 100% Outpatient:      Co-Pay: $25/visit Home Health:       Co-Pay: $25/visit DME: 80%     Co-Pay: 20% Providers:  SECONDARY:       Policy#:       Phone#:    Artist:       Phone#:    The Engineer, materials Information Summary" for patients in Inpatient Rehabilitation Facilities with attached "Privacy Act Statement-Health Care Records" was provided and verbally reviewed with: Patient and Family   Emergency Contact Information Contact Information       Name Relation Home Work Mobile    [place] Spouse [phone]   [phone]         Other Contacts   None on File      Current Medical History  Patient Admitting Diagnosis: bilateral TKA c/b bilat DVT, R LL PE, and orthostatic hypotension   History  of Present Illness: Pt is a 53 y/o male with PMH of umbilical hernia repair, and OA who was admitted to [facility] on 04/14/24 for bilateral TKA with Dr. Addie.  Post op course complicated by low grade fevers, intermittent tachycardia.  Bilateral LE dopplers were completed and showed DVT in the left posterior tibial vein and the right peroneal vein.  Given high  risk for proximal extension, he was started on xarelto  with plan for 3 month course of therapy at minimum and f/u with PCP.  He had ongoing low grade fevers despite postop ancef  x3 doses.  POD 3 pt significant orthostatic with PT with diaphoresis/nausea, unable to complete standing BP, but trending from 144/89 to 112/76 in sitting to 86/62 on immediate return to sitting.  He had ongoing tacchycardia with therapy so 2D echo and CT chest ordered.  Echo showed mild right ventricle hypertrophy.  CTA showed segmental and subsegmental pulmonary emboli in the right lower lobes without evidence of heart strain.  Therapy ongoing and recommendations were updated to CIR given functional decline and medical complexity.   Glasgow Coma Scale Score: 15   Patient's medical record from [name] has been reviewed by the rehabilitation admission coordinator and physician.   Past Medical History      Past Medical History:  Diagnosis Date   Arthritis            Has the patient had major surgery during 100 days prior to admission? Yes   Family History  family history includes Arrhythmia in his brother; Cancer in his father, maternal uncle, paternal uncle, and sister; Diabetes in his father; Hyperlipidemia in his brother; Hypertension in his brother, father, and mother; Irregular heart beat in his mother; Leukemia in his sister; Osteoarthritis in his mother; Other in his father; Prostate cancer in his father, maternal uncle, and paternal uncle.     Current Medications   Current Medications    Current Facility-Administered Medications:    0.9 %  sodium  chloride infusion, , Intravenous, Once, Magnant, Charles L, PA-C   acetaminophen  (TYLENOL ) tablet 325-650 mg, 325-650 mg, Oral, Q6H PRN, Magnant, Charles L, PA-C, 650 mg at 04/18/24 0707   calcium  carbonate (TUMS - dosed in mg elemental calcium ) chewable tablet 200 mg of elemental calcium , 1 tablet, Oral, TID PRN, Gherghe, Costin M, MD, 200 mg of elemental calcium  at 04/19/24 1324   gabapentin  (NEURONTIN ) capsule 200 mg, 200 mg, Oral, TID, Magnant, Charles L, PA-C, 200 mg at 04/20/24 0900   HYDROmorphone  (DILAUDID ) injection 0.5 mg, 0.5 mg, Intravenous, Q4H PRN, Magnant, Charles L, PA-C, 0.5 mg at 04/17/24 1355   lactated ringers  infusion, , Intravenous, Continuous, Woodrum, Chelsey L, MD, Stopped at 04/14/24 1259   menthol -cetylpyridinium (CEPACOL) lozenge 3 mg, 1 lozenge, Oral, PRN **OR** phenol (CHLORASEPTIC) mouth spray 1 spray, 1 spray, Mouth/Throat, PRN, Magnant, Charles L, PA-C   methocarbamol  (ROBAXIN ) tablet 500 mg, 500 mg, Oral, Q6H PRN, 500 mg at 04/20/24 0849 **OR** methocarbamol  (ROBAXIN ) injection 500 mg, 500 mg, Intravenous, Q6H PRN, Magnant, Charles L, PA-C   metoCLOPramide  (REGLAN ) tablet 5-10 mg, 5-10 mg, Oral, Q8H PRN **OR** metoCLOPramide  (REGLAN ) injection 5-10 mg, 5-10 mg, Intravenous, Q8H PRN, Magnant, Charles L, PA-C   metoprolol  tartrate (LOPRESSOR ) injection 2.5 mg, 2.5 mg, Intravenous, Q6H PRN, Georgina Ozell LABOR, MD, 2.5 mg at 04/16/24 2038   ondansetron  (ZOFRAN ) tablet 4 mg, 4 mg, Oral, Q6H PRN, Magnant, Charles L, PA-C   oxyCODONE  (Oxy IR/ROXICODONE ) immediate release tablet 5-10 mg, 5-10 mg, Oral, Q4H PRN, Magnant, Charles L, PA-C, 10 mg at 04/20/24 0849   polyethylene glycol (MIRALAX  / GLYCOLAX ) packet 17 g, 17 g, Oral, BID, Gherghe, Costin M, MD, 17 g at 04/20/24 0839   Rivaroxaban  (XARELTO ) tablet 15 mg, 15 mg, Oral, BID WC, 15 mg at 04/20/24 0838 **FOLLOWED BY** [START ON 05/07/2024] rivaroxaban  (XARELTO ) tablet 20 mg, 20 mg, Oral, Q supper,  Seena Marsa NOVAK, MD    senna-docusate (Senokot-S) tablet 2 tablet, 2 tablet, Oral, BID, Gherghe, Costin M, MD, 2 tablet at 04/20/24 0900   sorbitol  70 % solution 45 mL, 45 mL, Oral, Once, Lovorn, Megan, MD     Patients Current Diet:  Diet Order                  Diet - low sodium heart healthy             Diet regular Room service appropriate? Yes; Fluid consistency: Thin  Diet effective now                         Precautions / Restrictions Precautions Precautions: Fall, Knee Precaution Booklet Issued: Yes (comment) Precaution/Restrictions Comments: Pt and wife educated on need for knee extension at this time. Bed locked in extension, and education provided that NO pillow/roll/ice pack should be placed under the knee. 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  Independent   Indoor Mobility: Did the patient need assistance with walking from room to room (with or without device)? Independent   Stairs: Did the patient need assistance with internal or external stairs (with or without device)? Independent   Functional Cognition: Did the patient need help planning regular tasks such as shopping or remembering to take medications? Independent   Patient Information Are you of  Hispanic, Latino/a,or Spanish origin?: A. No, not of Hispanic, Latino/a, or Spanish origin What is your race?: B. Black or African American Do you need or want an interpreter to communicate with a doctor or health care staff?: 0. No   Patient's Response To:  Health Literacy and Transportation Is the patient able to respond to health literacy and transportation needs?: Yes Health Literacy - How often do you need to have someone help you when you read instructions, pamphlets, or other written material from your doctor or pharmacy?: Never In the past 12 months, has lack of transportation kept you from medical appointments or from getting medications?: No In the past 12 months, has lack of transportation kept you from meetings, work, or from getting things needed for daily living?: No   Home Assistive Devices / Equipment Home Equipment: None   Prior Device Use: Indicate devices/aids used by the patient prior to current illness, exacerbation or injury? None of the above   Current Functional Level Cognition   Orientation Level: Oriented X4    Extremity Assessment (includes Sensation/Coordination)   Upper Extremity Assessment: Right hand dominant, Overall WFL for tasks assessed  Lower Extremity Assessment: Defer to PT evaluation RLE Deficits / Details: generalized weakness, ROM not fully assessed as pt in knee immobilizer LLE Deficits / Details: generalized weakness and knee ROM deficits as anticipated on POD 0     ADLs  Overall ADL's : Needs assistance/impaired Eating/Feeding: Independent, Bed level Grooming: Independent, Bed level Upper Body Bathing: Set up, Bed level Lower Body Bathing: Moderate assistance, Maximal assistance, Cueing for compensatory techniques, Bed level Upper Body Dressing : Independent, Bed level Lower Body Dressing: Moderate assistance, Maximal assistance, Bed level, Cueing for compensatory techniques Toilet Transfer: Minimal assistance, Maximal assistance, +2 for  physical assistance, Ambulation, BSC/3in1, Rolling walker (2 wheels) Toilet Transfer Details (indicate cue type and reason): simulated bed to chair; Max assist +2 to power up and Min assist +2 once in standing Toileting- Clothing Manipulation and Hygiene: Set up, Maximal assistance, Bed level Toileting - Clothing Manipulation Details (indicate cue type and reason): Set up for use of hand held urinal and Max assist for peri care from bed level Functional mobility during ADLs: Minimal assistance, +2 for physical assistance, Rolling walker (2 wheels) General ADL Comments: Pt functional level limited by increased HR in sitting and standing this session.     Mobility   Overal bed mobility: Needs Assistance Bed Mobility: Sit to Supine, Supine to Sit Supine to sit: HOB elevated, Min assist, Used rails Sit to supine: Used rails, Mod assist General bed mobility comments: Assist for BLE advancement towards EOB. Pt with heavy use of UE's and rails for support. Increased time required for transition fully to EOB. Upon return to supine at end of session, pt required BLE elevation back up into bed. Pt able to use UE's at [facility] to reposition and straighten out.     Transfers   Overall transfer level: Needs assistance Equipment used: Rolling walker (2 wheels) Transfers: Sit to/from Stand, Bed to chair/wheelchair/BSC Sit to Stand: Max assist, From elevated surface, Via lift equipment Bed to/from chair/wheelchair/BSC transfer type:: Step pivot Step pivot transfers: Min assist, +2 safety/equipment Transfer via Lift Equipment: Stedy General transfer comment: Heavy assist to power up to full stand from elevated surface and use of Stedy for support. Increased time to gain/maintain standing balance.     Ambulation / Gait / Stairs / Wheelchair Mobility   Ambulation/Gait Ambulation/Gait assistance: Min assist, +2 safety/equipment, +2 physical assistance Gait Distance (Feet): 50 Feet Assistive device: Rolling walker  (2 wheels) Gait Pattern/deviations: Step-to pattern, Shuffle, Trunk flexed, Antalgic, Step-through pattern, Decreased stride length General Gait Details: Did not advance to gait training this session. Focus was transfer training and exercise. Gait velocity: Decreased Gait velocity interpretation: <1.31 ft/sec, indicative of household ambulator Pre-gait activities: standing weight shift to L/R sides in Bryant platform a few reps but not able to take steps 2/2 orthostatic hypotension symptoms     Posture / Balance Balance Overall balance assessment: Needs assistance Sitting-balance support: No upper extremity supported, Feet supported Sitting balance-Leahy Scale: Good Standing balance support: Bilateral upper extremity supported, During functional activity, Reliant on assistive device for balance Standing balance-Leahy Scale: Poor Standing balance comment: reliant on UE support     Special considerations/ Life events CPM , Skin bilateral knee incisions, and Special service needs new xarelto , monitor Hgb         Previous Home Environment (from acute therapy documentation) Living Arrangements: Spouse/significant other Available Help at Discharge: Family Type of Home: Apartment Home Layout: One level Home Access: Engineer, structural, Level entry Bathroom Shower/Tub: Health visitor: Standard Home Care Services: No Additional Comments: has two grown children who live in other parts of the state   Discharge Living Setting Plans for Discharge Living Setting: Patient's home, Lives with (comment) (spouse) Type of Home at Discharge: Apartment Discharge Home Layout: One  level Discharge Home Access: Elevator, Level entry Discharge Bathroom Shower/Tub: Walk-in shower Discharge Bathroom Toilet: Standard Discharge Bathroom Accessibility: Yes How Accessible: Accessible via walker Does the patient have any problems obtaining your medications?: No   Social/Family/Support Systems Patient  Roles: Spouse Anticipated Caregiver: spouse, Rosaline Anticipated Industrial/product designer Information: [phone] Ability/Limitations of Caregiver: min assist Caregiver Availability: 24/7 Discharge Plan Discussed with Primary Caregiver: Yes Is Caregiver In Agreement with Plan?: Yes Does Caregiver/Family have Issues with Lodging/Transportation while Pt is in Rehab?: No     Goals Patient/Family Goal for Rehab: PT/OT mod I, SLP n/a Expected length of stay: 12-15 days Additional Information: Discharge plan: expect mod I goals, home is easily accessible, spouse available to assist if needed Pt/Family Agrees to Admission and willing to participate: Yes Program Orientation Provided & Reviewed with Pt/Caregiver Including Roles  & Responsibilities: Yes     Decrease burden of Care through IP rehab admission: n/a     Possible need for SNF placement upon discharge: Not anticipated.  Plan for d/c home with mod I goals, spouse able to provide supervision if needed.      Patient Condition: This patient's condition remains as documented in the consult dated 04/20/24, in which the Rehabilitation Physician determined and documented that the patient's condition is appropriate for intensive rehabilitative care in an inpatient rehabilitation facility. Will admit to inpatient rehab today.   Preadmission Screen Completed By:  Reche FORBES Lowers, PT, DPT 04/20/2024 11:37 AM ______________________________________________________________________   Discussed status with Dr. Urbano on 04/22/24 at  10:16 AM  and received approval for admission today.   Admission Coordinator:  Audra Bellard E Shima Compere, PT, DPT time 10:16 AM Pattricia 04/22/24         Cosigned by: Urbano Albright, MD at 04/22/2024 11:07 AM   Revision History

## 2024-04-22 NOTE — Progress Notes (Signed)
 Physical Therapy Treatment Patient Details Name: Roger Pace MRN: [id number] DOB: [birth year] Today's Date: 04/22/2024   History of Present Illness Pt is a 53 y.o. male who presents to [facility] hospital on 04/14/2024 for elective bilateral TKA. Post-op, was found to have fevers and on further workup was found to have acute bilateral LE DVT. Pt with orthostatic hypotension and tachycardia in following PT sessions. Due to persistent tachycardia, eventually underwent a CT angiogram which also showed PE on 04/20/2024. PMH includes umbilical hernia repair, OA.    PT Comments  Pt resting in bed on arrival, endorsing increased fatigue and LE soreness due to being up multiple times during night to<>from BSC. Pt agreeable to session, despite fatigue, and demonstrating continued progress towards acute goals. Pt continues to require significant assist to rise to standing from elevated surface and demonstrates limited knee flexion bilaterally. Pt benefiting from cues for glute engagement on stance leg contralateral to KI with noted improvement in buckling. Pt requesting to return to supine at end of session. Patient will benefit from intensive inpatient follow-up therapy, >3 hours/day, will continue to follow acutely.    If plan is discharge home, recommend the following: A little help with walking and/or transfers;A little help with bathing/dressing/bathroom;Assistance with cooking/housework;Assist for transportation;Help with stairs or ramp for entrance   Can travel by private vehicle        Equipment Recommendations  Rolling walker (2 wheels);BSC/3in1    Recommendations for Other Services       Precautions / Restrictions Precautions Precautions: Fall;Knee Precaution Booklet Issued: Yes (comment) Recall of Precautions/Restrictions: Intact Precaution/Restrictions Comments: Pt educated on need for knee extension at this time. Bed locked in extension, and education provided that NO pillow/roll/ice pack  should be placed under the knee. Required Braces or Orthoses: Knee Immobilizer - Left Knee Immobilizer - Right: Other (comment) (per Dr. Addie, try to alternate knee immobilizer between legs to allow each leg the chance to bear more of the load when mobilizing) Knee Immobilizer - Left: Other (comment) (see above) Restrictions Weight Bearing Restrictions Per Provider Order: Yes RLE Weight Bearing Per Provider Order: Weight bearing as tolerated LLE Weight Bearing Per Provider Order: Weight bearing as tolerated     Mobility  Bed Mobility Overal bed mobility: Needs Assistance Bed Mobility: Sit to Supine, Supine to Sit     Supine to sit: Min assist Sit to supine: Used rails, Mod assist, +2 for physical assistance   General bed mobility comments: lpt utilizing gait belt as leg lifter on L, coming to sit on L EOB with light min A to lower LLE to floor and scoot out, +2 assist for elevation of LE's up into bed and for shoulder pivot to middle of bed.    Transfers Overall transfer level: Needs assistance Equipment used: Rolling walker (2 wheels) Transfers: Sit to/from Stand Sit to Stand: Max assist, From elevated surface, +2 physical assistance           General transfer comment: Heavy assist to power up to full stand from elevated surface. Increased time to gain/maintain standing balance.    Ambulation/Gait Ambulation/Gait assistance: Min assist, +2 safety/equipment, +2 physical assistance Gait Distance (Feet): 84 Feet Assistive device: Rolling walker (2 wheels) Gait Pattern/deviations: Shuffle, Trunk flexed, Antalgic, Step-through pattern, Decreased stride length Gait velocity: Decreased     General Gait Details: Pt motivated for distance. He was able to improve step/stride length, heel strike, and overall fluidity of walker movement with cues for glute engagement on stance leg. Chair  follow utilized but pt declined seated rest break or rollling back to room in chair.   Stairs              Wheelchair Mobility     Tilt Bed    Modified Rankin (Stroke Patients Only)       Balance Overall balance assessment: Needs assistance Sitting-balance support: No upper extremity supported, Feet supported Sitting balance-Leahy Scale: Good     Standing balance support: Bilateral upper extremity supported, During functional activity, Reliant on assistive device for balance Standing balance-Leahy Scale: Poor Standing balance comment: reliant on UE support                            Communication Communication Communication: No apparent difficulties  Cognition Arousal: Alert Behavior During Therapy: WFL for tasks assessed/performed   PT - Cognitive impairments: No apparent impairments                         Following commands: Intact      Cueing Cueing Techniques: Verbal cues  Exercises Total Joint Exercises Goniometric ROM: L knee: 78, [name]: 60 Other Exercises Other Exercises: standing hip knee flexion on L, knee imobilizer on R    General Comments General comments (skin integrity, edema, etc.): Wife present and supportive      Pertinent Vitals/Pain Pain Assessment Pain Assessment: Faces Faces Pain Scale: Hurts little more Pain Location: BLE with knee flexion and STS Pain Descriptors / Indicators: Discomfort, Grimacing, Operative site guarding, Sharp Pain Intervention(s): Limited activity within patient's tolerance, Monitored during session, Ice applied    Home Living                          Prior Function            PT Goals (current goals can now be found in the care plan section) Acute Rehab PT Goals Patient Stated Goal: to return to independence PT Goal Formulation: With patient/family Time For Goal Achievement: 05/03/24 Progress towards PT goals: Progressing toward goals    Frequency    7X/week      PT Plan      Co-evaluation              AM-PAC PT 6 Clicks Mobility   Outcome  Measure  Help needed turning from your back to your side while in a flat bed without using bedrails?: A Little Help needed moving from lying on your back to sitting on the side of a flat bed without using bedrails?: A Little Help needed moving to and from a bed to a chair (including a wheelchair)?: A Lot Help needed standing up from a chair using your arms (e.g., wheelchair or bedside chair)?: A Lot Help needed to walk in hospital room?: A Lot Help needed climbing 3-5 steps with a railing? : Total 6 Click Score: 13    End of Session Equipment Utilized During Treatment: Gait belt;Right knee immobilizer Activity Tolerance: Patient tolerated treatment well Patient left: with family/visitor present;with call bell/phone within reach;in bed Nurse Communication: Mobility status;Precautions;Need for lift equipment PT Visit Diagnosis: Other abnormalities of gait and mobility (R26.89);Muscle weakness (generalized) (M62.81);Pain Pain - Right/Left: Right Pain - part of body: Knee     Time: 9047-8974 PT Time Calculation (min) (ACUTE ONLY): 33 min  Charges:    $Gait Training: 23-37 mins PT General Charges $$ ACUTE PT VISIT: 1 Visit  Therisa SAUNDERS. PTA Acute Rehabilitation Services Office: [phone]   Therisa CHRISTELLA Boor 04/22/2024, 10:42 AM

## 2024-04-22 NOTE — Progress Notes (Signed)
  Subjective: Patient is stable.  Still slow to mobilize due to fatigue when standing and walking.  He is doing CT machine to get the knees ending on both sides   Objective: Vital signs in last 24 hours: Temp:  [97.7 F (36.5 C)-100.3 F (37.9 C)] 97.7 F (36.5 C) (09/03 0611) Pulse Rate:  [101-110] 101 (09/03 0611) Resp:  [16-18] 17 (09/03 0611) BP: (118-148)/(47-75) 148/66 (09/03 0611) SpO2:  [99 %-100 %] 100 % (09/03 0611)  Intake/Output from previous day: 09/02 0701 - 09/03 0700 In: 240 [P.O.:240] Out: 601 [Urine:600; Stool:1] Intake/Output this shift: Total I/O In: -  Out: 601 [Urine:600; Stool:1]  Exam:  Exam unchanged from yesterday.  Labs: No results for input(s): HGB in the last 72 hours. No results for input(s): WBC, RBC, HCT, PLT in the last 72 hours. Recent Labs    04/20/24 1209 04/22/24 0334  NA 128* 131*  K 4.0 4.3  CL 92* 96*  CO2 24 25  BUN 18 15  CREATININE 1.15 0.99  GLUCOSE 133* 115*  CALCIUM  8.2* 8.2*   No results for input(s): LABPT, INR in the last 72 hours.  Assessment/Plan: At this point we are waiting on insurance approval for inpatient rehab.  Otherwise Roger Pace is making slow but steady progress in terms of mobilization.  I think he would do well with focus rehab just to get his body moving and to get his strength back in the lower extremities.   Roger Pace 04/22/2024, 6:54 AM

## 2024-04-22 NOTE — Progress Notes (Signed)
 Inpatient Rehab Admissions Coordinator:   Continue to await insurance determination for CIR prior auth request.   Reche Lowers, PT, DPT Admissions Coordinator [phone] 04/22/24  8:38 AM

## 2024-04-22 NOTE — Progress Notes (Signed)
    Patient: Roger Pace FMW:[id number] DOB: Sep 17, 1970      Brief hospital course: 53 y.o. M with obesity, admitted for bilateral TKA, hospitalists consulted for VTE, fever, tachycardia, hyponatremia.    This is a no charge note, for further details, please see the note from Dr. Trixie from yesterday   Principal Problem:   S/P TKR (total knee replacement), bilateral Active Problems:   Class 2 obesity without serious comorbidity with body mass index (BMI) of 38.0 to 38.9 in adult   DVT (deep venous thrombosis) (HCC)   Tachycardia   Fever   Arthritis of right knee   Arthritis of left knee    Knee arthritis S/P TKA - Post-op care per Orthopedics - Rehab plan per Orthopedics   DVT Acute pulmonary embolism - Continue Xarelto  BID 21 days then reduce to once daily - Would continue Xarelto  3-6 months then stop, for this first time provoked VTE - No movement restrictions for this  - Follow up with PCP within 1 week of discharge from SNF - Has a daughter with a dural venous sinus thrombosis, although he believes she was tested for genetic mutations and had none - Follow up with Hematology PRN only   Fever Likely from VTE.  CTA chest no pneumonia, blood cultures negative, UA clean.  No other focal symptoms, no further work up recommended.     Sinus tachycardia Agree with Dr. Trixie, this is compensatory and expected given surgery, PE.  No further work up needed.  Avoid BBs.   Hyponatremia Mild, asymptomatic, not unexpected post-op.  No further work up recommended.   - Reasonable to repeat BMP with PCP at post-rehab follow up     Physical Exam: BP (!) 145/68 (BP Location: Right Arm)   Pulse (!) 105   Temp 98.8 F (37.1 C) (Oral)   Resp 18   Ht 5' 11.5 (1.816 m)   Wt 117.9 kg   SpO2 100%   BMI 35.76 kg/m   Patient seen and examined.     Family Communication: Wife        Author: [name], MD 04/22/2024 11:08 AM

## 2024-04-22 NOTE — H&P (Addendum)
 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 He was admitted on 04/14/24 for B-TKR by Dr. Vernetta. Post op noted to have fevers and intermittent tachycardia therefore BLE dopplers ordered which revealed BLE DVT. He was started on Xarelto  on 08/28 but continued to have issues with fevers as well as dizziness w/orthostatic hypotension. 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    CTA lungs 09/01 ordered due to hyperdynamic RV and revealed segmental and subsegmental PE in RLL. Sinus tachycardia felt to be compensatory to surgery and PE w/recs to continue DOAC for 3-6 months. He continues to have persistent leucocytosis and hyponatremia resolving.  Had multiple laxative last night with good results. Pain is controlled with current regimen. PT/OT has been working with patient who requires max assist with transfers, continues to have weak quads--still using KI, min assist +2 for mobility and set up to max assist for ADLs. He was independent and working PTA (lives in [place]).      Review of Systems  Constitutional:  Negative for chills (intermittently from ice packs) and fever.  HENT:  Negative for hearing loss and tinnitus.   Eyes:  Negative for blurred vision and double vision.  Respiratory:  Positive for wheezing. Negative for shortness of breath.   Cardiovascular:  Positive for leg swelling. Negative for chest pain and palpitations.  Gastrointestinal:  Positive for constipation (had multiple BMS last night). Negative for abdominal pain, nausea and vomiting.  Genitourinary: Negative.   Musculoskeletal:  Positive for myalgias.   Skin:  Negative for rash.  Neurological:  Positive for weakness. Negative for dizziness, sensory change and headaches.  Psychiatric/Behavioral:  The patient does not have insomnia.          Past Medical History:  Diagnosis Date   Abnormal LFTs      since teenager   Arthritis     H/O hyperglycemia     History of toe fracture 2018    R-4th MT Fx   History of umbilical hernia     Hypersomnia     Injury of left shoulder 2001               Past Surgical History:  Procedure Laterality Date   HARDWARE REMOVAL Right 04/14/2024    Procedure: REMOVAL, HARDWARE;  Surgeon: Addie Cordella Hamilton, MD;  Location: [facility] OR;  Service: Orthopedics;  Laterality: Right;   KNEE SURGERY Bilateral      arthroscopy on each knee, and reconstruction on right knee   LAPAROSCOPY N/A 09/18/2023    Procedure: DIAGNOSTIC LAPAROSCOPY;  Surgeon: Ebbie Cough, MD;  Location: [facility] OR;  Service: General;  Laterality: N/A;   SHOULDER SURGERY Left      repaired humerus (shoulder kept dislocating)   TOTAL KNEE ARTHROPLASTY Bilateral 04/14/2024    Procedure: ARTHROPLASTY, KNEE, BILATERAL, TOTAL;  Surgeon: Addie Cordella Hamilton, MD;  Location: [facility] OR;  Service: Orthopedics;  Laterality: Bilateral;   UMBILICAL HERNIA REPAIR N/A 09/18/2023    Procedure: UMBILICAL HERNIA REPAIR;  Surgeon: Ebbie Cough, MD;  Location: [facility] OR;  Service: General;  Laterality: N/A;  RNFA GEN & TAP BLOCK  Family History  Problem Relation Age of Onset   Hypertension Mother     Irregular heart beat Mother     Osteoarthritis Mother          Knees   Diabetes Father     Hypertension Father     Other Father          Elevated LFT   Prostate cancer Father     Cancer Father     Leukemia Sister     Cancer Sister     Arrhythmia Brother     Hyperlipidemia Brother     Hypertension Brother     Prostate cancer Maternal Uncle     Cancer Maternal Uncle     Prostate cancer Paternal Uncle     Cancer Paternal Uncle             Social History:   Married. 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 He reports that he has never smoked. He has never used smokeless tobacco. He reports that he does not drink alcohol and does not use drugs.     Allergies      Allergies  Allergen Reactions   Shellfish Allergy Hives              Medications Prior to Admission  Medication Sig Dispense Refill   Ascorbic Acid (VITAMIN C PO) Take 1 tablet by mouth at bedtime.       b complex vitamins capsule Take 1 capsule by mouth at bedtime.       Cholecalciferol (VITAMIN D -3 PO) Take 1 tablet by mouth at bedtime.       Collagen-Vitamin C-Biotin (COLLAGEN PO) Take 1 capsule by mouth at bedtime.       CREATINE PO Take 1 Scoop by mouth at bedtime.       MAGNESIUM  PO Take 1 capsule by mouth at bedtime.       POTASSIUM PO Take 1 capsule by mouth at bedtime.       [DISCONTINUED] celecoxib  (CELEBREX ) 200 MG capsule Take 1 capsule (200 mg total) by mouth 2 (two) times daily as needed. (Patient taking differently: Take 400 mg by mouth in the morning and at bedtime.) 60 capsule 6              Home: Home Living Family/patient expects to be discharged to:: Private residence Living Arrangements: Spouse/significant other Available Help at Discharge: Family Type of Home: Apartment Home Access: Elevator, Level entry Home Layout: One level Bathroom Shower/Tub: Health visitor: Standard Home Equipment: None Additional Comments: has two grown children who live in other parts of the state   Functional History: Prior Function Prior Level of Function : Independent/Modified Independent, Working/employed, Driving Mobility Comments: Ind without an AD ADLs Comments: Ind with ADLs and IADLs; works for [employer]; drives; 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 Transfers Overall transfer level: Needs assistance Equipment used: Rolling walker (2 wheels) Transfers: Sit to/from Stand Sit to Stand: Max assist, From elevated surface, +2 physical  assistance Bed to/from chair/wheelchair/BSC transfer type:: Step pivot Step pivot transfers: Min assist, +2 safety/equipment Transfer via Lift Equipment: Stedy General transfer comment: Heavy assist to power up to full stand from elevated surface. Increased time to gain/maintain standing balance. Ambulation/Gait Ambulation/Gait assistance: Min assist, +2 safety/equipment, +2 physical assistance Gait Distance (Feet): 84 Feet Assistive device: Rolling walker (2 wheels) Gait Pattern/deviations: Shuffle, Trunk flexed, Antalgic, Step-through pattern, Decreased stride length General Gait Details: Pt motivated for distance. He was able to improve step/stride length, heel strike, and overall fluidity of walker movement with cues for glute engagement on stance leg. Chair follow utilized but pt declined seated rest break or rollling back to room in chair. Gait velocity: Decreased Gait velocity interpretation: <1.31 ft/sec, indicative of household ambulator Pre-gait activities: standing weight shift to L/R sides in [place] platform a few reps but not able to take steps 2/2 orthostatic hypotension symptoms   ADL: ADL Overall ADL's : Needs assistance/impaired Eating/Feeding: Independent, Bed level Grooming: Contact guard assist, Standing, Wash/dry face Grooming Details (indicate cue type and reason): washing face, drying hands standing at sink. able to briefly stand without UE support but reports not fully trusting  his knees to hold him up yet Upper Body Bathing: Set up, Bed level Lower Body Bathing: Moderate assistance, Maximal assistance, Cueing for compensatory techniques, Bed level Upper Body Dressing : Independent, Bed level Lower Body Dressing: Total assistance, Bed level Lower Body Dressing Details (indicate cue type and reason): sock mgmt Toilet Transfer: Contact guard assist, Maximal assistance, +2 for physical assistance, +2 for safety/equipment, Ambulation, BSC/3in1, Rolling walker (2 wheels), Regular Toilet Toilet Transfer Details (indicate cue type and reason): BSC over toilet to highest height. Pt able to mobilize to/from bathroom with RW with CGA +2 for safety. Max A x 2 to stand from [facility] over toilet with one hand on grab bar and one hand on RW. assist to bring LLE back (R KI on during this session) as able Toileting- Clothing Manipulation and Hygiene: Set up, Maximal assistance, Bed level Toileting - Clothing Manipulation Details (indicate cue type and reason): Set up for use of hand held urinal and Max assist for peri care from bed level Functional mobility during ADLs: Contact guard assist, +2 for safety/equipment, Rolling walker (2 wheels) General ADL Comments: Pt functional level limited by increased HR in sitting and standing this session.   Cognition: Cognition Orientation Level: Oriented X4 Cognition Arousal: Alert Behavior During Therapy: WFL for tasks assessed/performed     Blood pressure (!) 145/68, pulse (!) 105, temperature 98.8 F (37.1 C), temperature source Oral, resp. rate 18, height 5' 11.5 (1.816 m), weight 117.9 kg, SpO2 100%.   General: No apparent distress HEENT: Head is normocephalic, atraumatic, sclera anicteric, oral mucosa pink and moist, dentition intact Neck: Supple without JVD or lymphadenopathy Heart: Reg rate and rhythm. No murmurs rubs or gallops Chest: CTA bilaterally without wheezes, non labored Abdomen: Soft, non-tender, mildly-distended, bowel  sounds positive. Extremities: Mild LE edema, b/l TKA incisions with dressing CDI Psych: Pt's affect is appropriate. Pt is cooperative Skin: Clean and intact without signs of breakdown Neuro:     Mental Status: AAOx3, memory intact, fund of knowledge appropriate Speech/Languate: Follows commands CRANIAL NERVES: 2-12 grossly intact   MOTOR: RUE: 5/5 Deltoid, 5/5 Biceps, 5/5 Triceps,5/5 Grip LUE: 5/5 Deltoid, 5/5 Biceps, 5/5 Triceps, 5/5 Grip RLE: HF 2/5, KE 2/5, ADF 4/5, APF 4/5 LLE: HF 2/5, KE 2/5, ADF 4/5, APF 4/5   SENSORY: Normal to touch all 4 extremities  Coordination: No ataxia or dysmetria noted       Lab Results Last 48 Hours        Results for orders placed or performed during the hospital encounter of 04/14/24 (from the past 48 hours)  Basic metabolic panel with GFR     Status: Abnormal    Collection Time: 04/22/24  3:34 AM  Result Value Ref Range    Sodium 131 (L) 135 - 145 mmol/L    Potassium 4.3 3.5 - 5.1 mmol/L    Chloride 96 (L) 98 - 111 mmol/L    CO2 25 22 - 32 mmol/L    Glucose, Bld 115 (H) 70 - 99 mg/dL      Comment: Glucose reference range applies only to samples taken after fasting for at least 8 hours.    BUN 15 6 - 20 mg/dL    Creatinine, Ser 9.00 0.61 - 1.24 mg/dL    Calcium  8.2 (L) 8.9 - 10.3 mg/dL    GFR, Estimated >39 >39 mL/min      Comment: (NOTE) Calculated using the CKD-EPI Creatinine Equation (2021)      Anion gap 10 5 - 15      Comment: Performed at [facility], [address], [place], [place] [address]       Imaging Results (Last 48 hours)  CT Angio Chest Pulmonary Embolism (PE) W or WO Contrast Addendum Date: 04/20/2024 ADDENDUM REPORT: 04/20/2024 16:12 ADDENDUM: Critical Value/emergent results were called by telephone at the time of interpretation on 04/20/2024 at 4:11 pm to provider COSTIN GHERGHE , who verbally acknowledged these results. Electronically Signed   By: Leita Birmingham M.D.   On: 04/20/2024 16:12    Result Date:  04/20/2024 CLINICAL DATA:  Pulmonary embolism suspected, high probability. EXAM: CT ANGIOGRAPHY CHEST WITH CONTRAST TECHNIQUE: Multidetector CT imaging of the chest was performed using the standard protocol during bolus administration of intravenous contrast. Multiplanar CT image reconstructions and MIPs were obtained to evaluate the vascular anatomy. RADIATION DOSE REDUCTION: This exam was performed according to the departmental dose-optimization program which includes automated exposure control, adjustment of the mA and/or kV according to patient size and/or use of iterative reconstruction technique. CONTRAST:  75mL OMNIPAQUE  IOHEXOL  350 MG/ML SOLN COMPARISON:  None Available. FINDINGS: Cardiovascular: The heart is normal in size and there is no pericardial effusion. There is atherosclerotic calcification of the aorta without evidence of aneurysm. The pulmonary trunk is normal in caliber. Examination is limited due to mixing artifact and respiratory motion. Segmental and subsegmental pulmonary emboli are present in the right lower lobe. No definite evidence of right heart strain. Mediastinum/Nodes: No enlarged mediastinal, hilar, or axillary lymph nodes. Thyroid  gland, trachea, and esophagus demonstrate no significant findings. Lungs/Pleura: Mild atelectasis is noted bilaterally. No effusion or pneumothorax. Upper Abdomen: No acute abnormality. Musculoskeletal: No acute osseous abnormality. Review of the MIP images confirms the above findings. IMPRESSION: 1. Segmental and subsegmental pulmonary emboli in the right lower lobe. No evidence of right heart strain. 2. Aortic atherosclerosis. Electronically Signed: By: Leita Birmingham M.D. On: 04/20/2024 15:56           Blood pressure (!) 145/68, pulse (!) 105, temperature 98.8 F (37.1 C), temperature source Oral, resp. rate 18, height 5' 11.5 (1.816 m), weight 117.9 kg, SpO2 100%.   Medical Problem List and Plan: 1. Functional deficits secondary to B/L knee OA  s/p b/l TKA complicated by acute PE             -  patient may shower, cover incisions please             -ELOS/Goals: 12-15 days, PT/OT Mod I             -Admit to CIR 2.  Acute DVT/PE/Antithrombotics: -DVT/anticoagulation:  Pharmaceutical: Xarelto              -antiplatelet therapy: N/A 3. Pain Management: oxycodone  and robaxin  prn. 4. Mood/Behavior/Sleep: LCSW to follow for evaluation and support.              -antipsychotic agents: N/A 5. Neuropsych/cognition: This patient is capable of making decisions on his own behalf. 6. Skin/Wound Care: Routine pressure relief measures. --Surgical dressings intact on both knees. 7. Fluids/Electrolytes/Nutrition: Monitor I/O. Check CMET in am  8. 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 Addie 9. BP/Orthostatic hypotension: BP improving. Continue to monitor.  10.  Hyponatremia/Hypochloremia: Improving with improvement in intake? --Continue to monitor.  Recheck labs tomorrow.  11. ABLA: Recheck CBC in am. HGB 11.5 on 8/29 12.  Low grade fevers: T-max 100.3 yesterday evening.  --Continue to monitor for signs of infection. CXR and UA neg --BC X 2  08/30 -->negative/pending.  13. Constipation: Up all night after mag citrate.    -Continue Miralax  decrease to daily, senokot  -Dulcolax supp  PRN  14. Leucocytosis: Monitor WBC as rising from 4.7-->14.4             --monitor for fevers and other signs of infection.  15. Impaired fasting glucose:  Check AIc in am.      Sharlet GORMAN Schmitz, PA-C 04/22/2024   I have personally performed a face to face diagnostic evaluation of this patient and formulated the key components of the plan.  Additionally, I have personally reviewed laboratory data, imaging studies, as well as relevant notes and concur with the physician assistant's documentation above.   The patient's status has not changed from the original H&P.  Any changes in documentation from the acute care chart have been noted above.   Murray Collier, MD

## 2024-04-22 NOTE — Progress Notes (Signed)
 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    His initial plan was to go home afterwards and do therapy at home, however he's had multiple complications since admission with fevers, and extensive work up- he was diagnosed with multiple B/L LE DVT's a few days after surgery.    He's still having low grade fevers and last WBC was 14.4- on 8/29. He was also seen to have significant tachycardia- up to 169 yesterday with therapy and is running 115-120's at rest. Base don an ECHO yesterday, there is concern for R heart strain and a CTA was ordered for today to determine if has any Pe's.   He's been seen by OT and PT- he's min A of 2 for gait 4ft with RW, however max A of 2 sit to stand.  He's  ADLs are mod A for LB ADL's.    He reports pain 2-3/10 at rest due to CPM; and  6-7/10 after therapy even though he took pain meds prior. But he does feel pain meds are working- doesn't want to change.    However LBM was 7 days ago- they've given him stool softeners and Miralax - will start Sorbitol  to help him go.    Does report eating well even though decreased appetite.  Usually goes 1-3 days in between BM's at home.      Review of Systems  Constitutional:  Positive for malaise/fatigue.  HENT: Negative.  Negative for hearing loss.   Eyes: Negative.  Negative for blurred vision.  Respiratory:         Mild SOB, but cannot tell if due to pain?   Cardiovascular:        Tachycardia esp with therapy  Gastrointestinal:  Positive for constipation. Negative for abdominal pain and nausea.  Genitourinary:  Negative for dysuria, frequency and urgency.  Musculoskeletal:  Positive for joint pain and myalgias.  Skin: Negative.   Neurological:  Positive for focal weakness and weakness. Negative for dizziness and sensory change.  Endo/Heme/Allergies: Negative.   Psychiatric/Behavioral: Negative.    All other systems reviewed and are negative.      Past Medical History:  Diagnosis Date   Arthritis               Past Surgical History:  Procedure Laterality Date   HARDWARE REMOVAL Right 04/14/2024    Procedure: REMOVAL, HARDWARE;  Surgeon: Addie Cordella Hamilton, MD;  Location: [facility] OR;  Service: Orthopedics;  Laterality: Right;   KNEE SURGERY Bilateral      arthroscopy on each knee, and reconstruction on right knee   LAPAROSCOPY N/A 09/18/2023    Procedure: DIAGNOSTIC LAPAROSCOPY;  Surgeon: Ebbie Cough, MD;  Location: [facility] OR;  Service: General;  Laterality: N/A;   SHOULDER SURGERY Left      repaired humerus (shoulder kept dislocating)   TOTAL KNEE ARTHROPLASTY Bilateral 04/14/2024  Procedure: ARTHROPLASTY, KNEE, BILATERAL, TOTAL;  Surgeon: Addie Cordella Hamilton, MD;  Location: [facility] OR;  Service: Orthopedics;  Laterality: Bilateral;   UMBILICAL HERNIA REPAIR N/A 09/18/2023    Procedure: UMBILICAL HERNIA REPAIR;  Surgeon: Ebbie Cough, MD;  Location: [facility] OR;  Service: General;  Laterality: N/A;  RNFA GEN & TAP BLOCK             Family History  Problem Relation Age of Onset   Hypertension Mother     Irregular heart beat Mother     Osteoarthritis Mother          Knees   Diabetes Father     Hypertension Father     Other Father          Elevated LFT   Prostate cancer Father     Cancer Father     Leukemia Sister     Cancer Sister     Arrhythmia Brother     Hyperlipidemia Brother     Hypertension Brother     Prostate cancer  Maternal Uncle     Cancer Maternal Uncle     Prostate cancer Paternal Uncle     Cancer Paternal Uncle          Social History:  reports that he has never smoked. He has never used smokeless tobacco. He reports that he does not drink alcohol and does not use drugs. Allergies:  Allergies      Allergies  Allergen Reactions   Shellfish Allergy Hives            Medications Prior to Admission  Medication Sig Dispense Refill   Ascorbic Acid (VITAMIN C PO) Take 1 tablet by mouth at bedtime.       b complex vitamins capsule Take 1 capsule by mouth at bedtime.       Cholecalciferol (VITAMIN D -3 PO) Take 1 tablet by mouth at bedtime.       Collagen-Vitamin C-Biotin (COLLAGEN PO) Take 1 capsule by mouth at bedtime.       CREATINE PO Take 1 Scoop by mouth at bedtime.       MAGNESIUM  PO Take 1 capsule by mouth at bedtime.       POTASSIUM PO Take 1 capsule by mouth at bedtime.       [DISCONTINUED] celecoxib  (CELEBREX ) 200 MG capsule Take 1 capsule (200 mg total) by mouth 2 (two) times daily as needed. (Patient taking differently: Take 400 mg by mouth in the morning and at bedtime.) 60 capsule 6          Home: Home Living Family/patient expects to be discharged to:: Private residence Living Arrangements: Spouse/significant other Available Help at Discharge: Family Type of Home: Apartment Home Access: Elevator, Level entry Home Layout: One level Bathroom Shower/Tub: Health visitor: Standard Home Equipment: None Additional Comments: has two grown children who live in other parts of the state  Functional History: Prior Function Prior Level of Function : Independent/Modified Independent, Working/employed, Driving Mobility Comments: Ind without an AD ADLs Comments: Ind with ADLs and IADLs; works for [employer]; drives; 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 Pt with heavy use of UE's and rails for support. Increased time required for transition fully to EOB. Upon  return to supine at end of session, pt required BLE elevation back up into bed. Pt able to use UE's at [facility] to reposition and straighten out. Transfers Overall transfer level: Needs assistance Equipment used: Rolling walker (2 wheels) Transfers: Sit to/from Stand, Bed to chair/wheelchair/BSC Sit to Stand: Max assist, From elevated surface, Via lift equipment Bed to/from chair/wheelchair/BSC transfer type:: Step pivot Step pivot transfers: Min assist, +2 safety/equipment Transfer via Lift Equipment: Stedy General transfer comment: Heavy assist to power up to full stand from elevated surface and use of Stedy for support. Increased time to gain/maintain standing balance. Ambulation/Gait Ambulation/Gait assistance: Min assist, +2 safety/equipment, +2 physical assistance Gait Distance (Feet): 50 Feet Assistive device: Rolling walker (2 wheels) Gait Pattern/deviations: Step-to pattern, Shuffle, Trunk flexed, Antalgic, Step-through pattern, Decreased stride length General Gait Details: Did not advance to gait training this session. Focus was transfer training and exercise. Gait velocity: Decreased Gait velocity interpretation: <1.31 ft/sec, indicative of household ambulator Pre-gait activities: standing weight shift to L/R sides in [place] platform a few reps but not able to take steps 2/2 orthostatic hypotension symptoms   ADL: ADL Overall ADL's : Needs assistance/impaired Eating/Feeding: Independent, Bed level Grooming: Independent, Bed level Upper Body Bathing: Set up, Bed level Lower Body Bathing: Moderate assistance, Maximal assistance, Cueing for compensatory techniques, Bed level Upper Body Dressing :  Independent, Bed level Lower Body Dressing: Moderate assistance, Maximal assistance, Bed level, Cueing for compensatory techniques Toilet Transfer: Minimal assistance, Maximal assistance, +2 for physical assistance, Ambulation, BSC/3in1, Rolling walker (2 wheels) Toilet Transfer Details (indicate cue type and reason): simulated bed to chair; Max assist +2 to power up and Min assist +2 once in standing Toileting- Clothing Manipulation and Hygiene: Set up, Maximal assistance, Bed level Toileting - Clothing Manipulation Details (indicate cue type and reason): Set up for use of hand held urinal and Max assist for peri care from bed level Functional mobility during ADLs: Minimal assistance, +2 for physical assistance, Rolling walker (2 wheels) General ADL Comments: Pt functional level limited by increased HR in sitting and standing this session.   Cognition: Cognition Orientation Level: Oriented X4 Cognition Arousal: Alert Behavior During Therapy: WFL for tasks assessed/performed   Blood pressure (!) 151/59, pulse (!) 112, temperature 99.9 F (37.7 C), temperature source Oral, resp. rate 18, height 5' 11.5 (1.816 m), weight 117.9 kg, SpO2 96%. Physical Exam Vitals and nursing note reviewed.  Constitutional:      Appearance: Normal appearance. He is obese.     Comments: Pt awake, alert, appropriate, sitting up in bed; muscular build overall, NAD  HENT:     Head: Normocephalic and atraumatic.     Right Ear: External ear normal.     Left Ear: External ear normal.     Nose: Nose normal. No congestion.     Mouth/Throat:     Mouth: Mucous membranes are moist.     Pharynx: Oropharynx is clear. No oropharyngeal exudate.  Eyes:     General:        Right eye: No discharge.        Left eye: No discharge.     Extraocular Movements: Extraocular movements intact.  Cardiovascular:     Rate and Rhythm: Regular rhythm. Tachycardia present.     Heart sounds: Normal heart sounds. No murmur heard.     No gallop.  Pulmonary:     Effort: Pulmonary effort is normal. No respiratory distress.     Breath sounds: Normal breath sounds. No wheezing, rhonchi or rales.  Abdominal:     Comments: Protuberant vs distended moderately; hypoactive BS; soft; NT  Musculoskeletal:     Cervical back: Neck supple. No tenderness.     Comments: Ue's 5/5 in B/L arms- throughout LEs HF 2-/5 limited by pain; DF/PF 5/5 B/L- couldn't tolerate a lot of ROM of knees B/L- not on CPM currently Has cooling leg sleeves in place B/L  Skin:    General: Skin is warm and dry.     Comments: B/L knee incisions covered IV's in B/L forearms- look OK  Neurological:     Mental Status: He is alert.     Comments: Intact to light touch in all 4 extremities Ox3 intact cognition  Psychiatric:        Mood and Affect: Mood normal.        Behavior: Behavior normal.       Lab Results Last 24 Hours  No results found for this or any previous visit (from the past 24 hours).    Imaging Results (Last 48 hours)  ECHOCARDIOGRAM COMPLETE Result Date: 04/19/2024    ECHOCARDIOGRAM REPORT   Patient Name:   LEEANDRE NORDLING Date of Exam: 04/19/2024 Medical Rec #:  [id number]         Height:       71.5 in Accession #:    [id number]        Weight:       260.0 lb Date of Birth:  Jul 02, 1971         BSA:          2.370 m Patient Age:    52 years          BP:           137/63 mmHg Patient Gender: M                 HR:           117 bpm. Exam Location:  Inpatient Procedure: 2D Echo, Cardiac Doppler and Color Doppler (Both Spectral and Color            Flow Doppler were utilized during procedure). Indications:    Abnormal ECG R94.31  History:        Patient has no prior history of Echocardiogram examinations.                 Arrythmias:Tachycardia.  Sonographer:    Thea Norlander RCS Referring Phys: [phone] NILDA HERO GHERGHE IMPRESSIONS  1. Left ventricular ejection fraction, by estimation, is 65 to 70%. The left ventricle has normal function. The left  ventricle has no regional wall motion abnormalities. Left ventricular diastolic parameters were normal.  2. Right ventricular systolic function is hyperdynamic. The right ventricular size is mildly enlarged.  3. The mitral valve is normal in structure. No evidence of mitral valve regurgitation. No evidence of mitral stenosis.  4. The aortic valve is tricuspid. Aortic valve regurgitation is not visualized. No aortic stenosis is present.  5. Aortic dilatation noted. There is mild dilatation of the aortic root, measuring 40 mm.  6. The inferior vena cava is normal in size with greater than 50% respiratory variability, suggesting right atrial pressure of 3 mmHg. Comparison(s): No prior Echocardiogram. FINDINGS  Left Ventricle: Left ventricular ejection fraction, by estimation, is 65 to 70%. The left ventricle has normal function. The left ventricle has no regional wall motion abnormalities. The left ventricular internal cavity size was normal in size. There is  no left ventricular hypertrophy. Left ventricular diastolic parameters  were normal. Right Ventricle: The right ventricular size is mildly enlarged. No increase in right ventricular wall thickness. Right ventricular systolic function is hyperdynamic. Left Atrium: Left atrial size was normal in size. Right Atrium: Right atrial size was normal in size. Pericardium: There is no evidence of pericardial effusion. Mitral Valve: The mitral valve is normal in structure. Mild mitral annular calcification. No evidence of mitral valve regurgitation. No evidence of mitral valve stenosis. Tricuspid Valve: The tricuspid valve is normal in structure. Tricuspid valve regurgitation is not demonstrated. No evidence of tricuspid stenosis. Aortic Valve: The aortic valve is tricuspid. Aortic valve regurgitation is not visualized. No aortic stenosis is present. Aortic valve peak gradient measures 9.7 mmHg. Pulmonic Valve: The pulmonic valve was normal in structure. Pulmonic valve  regurgitation is not visualized. No evidence of pulmonic stenosis. Aorta: Aortic dilatation noted. There is mild dilatation of the aortic root, measuring 40 mm. Venous: The inferior vena cava is normal in size with greater than 50% respiratory variability, suggesting right atrial pressure of 3 mmHg. IAS/Shunts: The interatrial septum appears to be lipomatous. No atrial level shunt detected by color flow Doppler.  LEFT VENTRICLE PLAX 2D LVIDd:         4.40 cm   Diastology LVIDs:         2.80 cm   LV e' medial:    10.10 cm/s LV PW:         1.10 cm   LV E/e' medial:  7.8 LV IVS:        1.00 cm   LV e' lateral:   16.90 cm/s LVOT diam:     2.40 cm   LV E/e' lateral: 4.7 LV SV:         70 LV SV Index:   29 LVOT Area:     4.52 cm  LEFT ATRIUM           Index        RIGHT ATRIUM           Index LA diam:      3.50 cm 1.48 cm/m   RA Area:     10.90 cm LA Vol (A2C): 41.4 ml 17.47 ml/m  RA Volume:   17.30 ml  7.30 ml/m LA Vol (A4C): 36.7 ml 15.49 ml/m  AORTIC VALVE AV Area (Vmax): 3.48 cm AV Vmax:        156.00 cm/s AV Peak Grad:   9.7 mmHg LVOT Vmax:      120.00 cm/s LVOT Vmean:     74.600 cm/s LVOT VTI:       0.154 m  AORTA Ao Root diam: 4.00 cm Ao Asc diam:  3.60 cm MITRAL VALVE MV Area (PHT): 5.02 cm    SHUNTS MV Decel Time: 151 msec    Systemic VTI:  0.15 m MV E velocity: 78.60 cm/s  Systemic Diam: 2.40 cm MV A velocity: 95.40 cm/s MV E/A ratio:  0.82 Stanly Leavens MD Electronically signed by Stanly Leavens MD Signature Date/Time: 04/19/2024/2:25:06 PM    Final          Assessment/Plan: 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 Yes Co-Morbidities requiring supervision/potential complications: Tachycardia into 110s to 120s at rest- up to 170 with therapy; R heart strain- likely PE-s pending  CTA; severe constipation  Due to bowel management, safety, skin/wound care, disease management, medication administration, pain management, and patient education, does the patient require 24 hr/day rehab nursing? Yes Does the patient require coordinated care of a physician, rehab nurse, therapy disciplines of PT and OT to address physical and functional deficits in the context of the above medical diagnosis(es)? Yes Addressing deficits in the following areas: balance, endurance, locomotion, strength, transferring, bowel/bladder control, bathing, dressing, feeding, grooming, and toileting Can the patient actively participate in an intensive therapy program of at least 3 hrs of therapy per day at least 5 days per week? Yes The potential for patient to make measurable gains while on inpatient rehab is good Anticipated functional outcomes upon discharge from inpatient rehab are modified independent and supervision  with PT, modified independent and supervision with OT, n/a with SLP. Estimated rehab length of stay to reach the above functional goals is: 7-10 days at most Anticipated discharge destination: Home Overall Rehab/Functional Prognosis: good   RECOMMENDATIONS: This patient's condition is appropriate for continued rehabilitative care in the following setting: CIR due to having Severe tachycardia, probable Pe's, B/L LE DVT's as well as his admitted issues of B/L TKR's Patient has agreed to participate in recommended program. Yes Note that insurance prior authorization may be required for reimbursement for recommended care.   Comment:  Pt's LBM was 7 days ago- ordered Sorbitol  45cc at 4pm today so pt could have a BM.  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 Esp in setting of  B/L DVTs in LE's.  Patient feels Pain regimen is working for him- but might need to decrease if cannot have BM.  Appropriately on Xarelto - con't per primary team.  Spoke to admissions coordinator and pt's nurse about issues and hopeful admission Thank you for this consult     I spent a total of  62  minutes on total care today- >50% coordination of care- due to  D/w admissions coordinator, pt's nurse, pt; review of chart, exam and documentation of consult.    Megan Lovorn, MD 04/20/2024          Routing History

## 2024-04-23 ENCOUNTER — Other Ambulatory Visit: Payer: Self-pay

## 2024-04-23 ENCOUNTER — Inpatient Hospital Stay (HOSPITAL_COMMUNITY)

## 2024-04-23 DIAGNOSIS — K5900 Constipation, unspecified: Secondary | ICD-10-CM | POA: Diagnosis not present

## 2024-04-23 DIAGNOSIS — M170 Bilateral primary osteoarthritis of knee: Secondary | ICD-10-CM | POA: Diagnosis not present

## 2024-04-23 DIAGNOSIS — D62 Acute posthemorrhagic anemia: Secondary | ICD-10-CM | POA: Diagnosis not present

## 2024-04-23 DIAGNOSIS — I2699 Other pulmonary embolism without acute cor pulmonale: Secondary | ICD-10-CM | POA: Diagnosis not present

## 2024-04-23 LAB — CBC WITH DIFFERENTIAL/PLATELET
Abs Immature Granulocytes: 0.34 K/uL — ABNORMAL HIGH (ref 0.00–0.07)
Basophils Absolute: 0 K/uL (ref 0.0–0.1)
Basophils Relative: 0 %
Eosinophils Absolute: 0.1 K/uL (ref 0.0–0.5)
Eosinophils Relative: 1 %
HCT: 21.8 % — ABNORMAL LOW (ref 39.0–52.0)
Hemoglobin: 7.3 g/dL — ABNORMAL LOW (ref 13.0–17.0)
Immature Granulocytes: 3 %
Lymphocytes Relative: 19 %
Lymphs Abs: 2.4 K/uL (ref 0.7–4.0)
MCH: 31.2 pg (ref 26.0–34.0)
MCHC: 33.5 g/dL (ref 30.0–36.0)
MCV: 93.2 fL (ref 80.0–100.0)
Monocytes Absolute: 1.2 K/uL — ABNORMAL HIGH (ref 0.1–1.0)
Monocytes Relative: 10 %
Neutro Abs: 8.6 K/uL — ABNORMAL HIGH (ref 1.7–7.7)
Neutrophils Relative %: 67 %
Platelets: 518 K/uL — ABNORMAL HIGH (ref 150–400)
RBC: 2.34 MIL/uL — ABNORMAL LOW (ref 4.22–5.81)
RDW: 14.4 % (ref 11.5–15.5)
WBC: 12.7 K/uL — ABNORMAL HIGH (ref 4.0–10.5)
nRBC: 0.6 % — ABNORMAL HIGH (ref 0.0–0.2)

## 2024-04-23 LAB — CBC
HCT: 22 % — ABNORMAL LOW (ref 39.0–52.0)
Hemoglobin: 7.2 g/dL — ABNORMAL LOW (ref 13.0–17.0)
MCH: 31 pg (ref 26.0–34.0)
MCHC: 32.7 g/dL (ref 30.0–36.0)
MCV: 94.8 fL (ref 80.0–100.0)
Platelets: 558 K/uL — ABNORMAL HIGH (ref 150–400)
RBC: 2.32 MIL/uL — ABNORMAL LOW (ref 4.22–5.81)
RDW: 14.6 % (ref 11.5–15.5)
WBC: 14.1 K/uL — ABNORMAL HIGH (ref 4.0–10.5)
nRBC: 1 % — ABNORMAL HIGH (ref 0.0–0.2)

## 2024-04-23 LAB — COMPREHENSIVE METABOLIC PANEL WITH GFR
ALT: 113 U/L — ABNORMAL HIGH (ref 0–44)
AST: 135 U/L — ABNORMAL HIGH (ref 15–41)
Albumin: 2.7 g/dL — ABNORMAL LOW (ref 3.5–5.0)
Alkaline Phosphatase: 51 U/L (ref 38–126)
Anion gap: 15 (ref 5–15)
BUN: 15 mg/dL (ref 6–20)
CO2: 20 mmol/L — ABNORMAL LOW (ref 22–32)
Calcium: 8.5 mg/dL — ABNORMAL LOW (ref 8.9–10.3)
Chloride: 99 mmol/L (ref 98–111)
Creatinine, Ser: 1.06 mg/dL (ref 0.61–1.24)
GFR, Estimated: >60 mL/min (ref >60)
Glucose, Bld: 122 mg/dL — ABNORMAL HIGH (ref 70–99)
Potassium: 4.5 mmol/L (ref 3.5–5.1)
Sodium: 134 mmol/L — ABNORMAL LOW (ref 135–145)
Total Bilirubin: 1.4 mg/dL — ABNORMAL HIGH (ref 0.0–1.2)
Total Protein: 7.1 g/dL (ref 6.5–8.1)

## 2024-04-23 LAB — CULTURE, BLOOD (ROUTINE X 2)
Culture: NO GROWTH
Culture: NO GROWTH

## 2024-04-23 LAB — URINALYSIS, ROUTINE W REFLEX MICROSCOPIC
Bilirubin Urine: NEGATIVE
Glucose, UA: NEGATIVE mg/dL
Hgb urine dipstick: NEGATIVE
Ketones, ur: NEGATIVE mg/dL
Leukocytes,Ua: NEGATIVE
Nitrite: NEGATIVE
Protein, ur: NEGATIVE mg/dL
Specific Gravity, Urine: 1.018 (ref 1.005–1.030)
pH: 6 (ref 5.0–8.0)

## 2024-04-23 LAB — LACTIC ACID, PLASMA
Lactic Acid, Venous: 2.2 mmol/L (ref 0.5–1.9)
Lactic Acid, Venous: 1.6 mmol/L (ref 0.5–1.9)

## 2024-04-23 LAB — VITAMIN D 25 HYDROXY (VIT D DEFICIENCY, FRACTURES): Vit D, 25-Hydroxy: 52.55 ng/mL (ref 30–100)

## 2024-04-23 LAB — HEMOGLOBIN A1C
Hgb A1c MFr Bld: 4.9 % (ref 4.8–5.6)
Mean Plasma Glucose: 93.93 mg/dL

## 2024-04-23 LAB — MAGNESIUM: Magnesium: 2.6 mg/dL — ABNORMAL HIGH (ref 1.7–2.4)

## 2024-04-23 LAB — PROCALCITONIN: Procalcitonin: 0.53 ng/mL

## 2024-04-23 MED ORDER — SENNOSIDES-DOCUSATE SODIUM 8.6-50 MG PO TABS
2.0000 | ORAL_TABLET | Freq: Every day | ORAL | Status: DC
  Administered 2024-04-24 – 2024-04-26 (×3): 2 via ORAL
  Filled 2024-04-23 (×4): qty 2

## 2024-04-23 MED ORDER — SODIUM CHLORIDE 0.9 % IV BOLUS
1000.0000 mL | Freq: Once | INTRAVENOUS | Status: AC
  Administered 2024-04-23: 1000 mL via INTRAVENOUS

## 2024-04-23 NOTE — Progress Notes (Signed)
 Inpatient Rehabilitation  Patient information reviewed and entered into eRehab system by [facility]. Karen Kays., CCC/SLP, PPS Coordinator.  Information including medical coding, functional ability and quality indicators will be reviewed and updated through discharge.

## 2024-04-23 NOTE — Evaluation (Signed)
**Note Roger-Identified via Obfuscation**  Occupational Therapy Assessment and Plan  Patient Details  Name: Roger Pace MRN: [id number] Date of Birth: 1971-07-17  OT Diagnosis: muscle weakness (generalized), pain in joint, and swelling of limb Rehab Potential: Rehab Potential (ACUTE ONLY): Good ELOS:     Today's Date: 04/23/2024 OT Individual Time: 9168-9045 OT Individual Time Calculation (min): 83 min     Hospital Problem: Principal Problem:   OA (osteoarthritis) of knee Active Problems:   Hyponatremia   ABLA (acute blood loss anemia)   Leukocytosis   Constipation   Acute pulmonary embolism (HCC)   Past Medical History:  Past Medical History:  Diagnosis Date   Abnormal LFTs    since teenager   Arthritis    H/O hyperglycemia    History of toe fracture 2018   R-4th MT Fx   History of umbilical hernia    Hypersomnia    Injury of left shoulder 2001   Past Surgical History:  Past Surgical History:  Procedure Laterality Date   HARDWARE REMOVAL Right 04/14/2024   Procedure: REMOVAL, HARDWARE;  Surgeon: Addie Cordella Hamilton, MD;  Location: [facility] OR;  Service: Orthopedics;  Laterality: Right;   KNEE SURGERY Bilateral    arthroscopy on each knee, and reconstruction on right knee   LAPAROSCOPY N/A 09/18/2023   Procedure: DIAGNOSTIC LAPAROSCOPY;  Surgeon: Ebbie Cough, MD;  Location: [facility] OR;  Service: General;  Laterality: N/A;   SHOULDER SURGERY Left    repaired humerus (shoulder kept dislocating)   TOTAL KNEE ARTHROPLASTY Bilateral 04/14/2024   Procedure: ARTHROPLASTY, KNEE, BILATERAL, TOTAL;  Surgeon: Addie Cordella Hamilton, MD;  Location: [facility] OR;  Service: Orthopedics;  Laterality: Bilateral;   UMBILICAL HERNIA REPAIR N/A 09/18/2023   Procedure: UMBILICAL HERNIA REPAIR;  Surgeon: Ebbie Cough, MD;  Location: [facility] OR;  Service: General;  Laterality: N/A;  RNFA GEN & TAP BLOCK    Assessment & Plan Clinical Impression: Patient is a 53 y.o. male with history of abnormal LFTs, bilateral knee surgeries in the  past w/OA bilateral knees with failure on conservative management. He was admitted on 04/14/24 for B-TKR by Dr. Vernetta. Post op noted to have fevers and intermittent tachycardia therefore BLE dopplers ordered which revealed BLE DVT. He was started on Xarelto  on 08/28 but continued to have issues with fevers as well as dizziness w/orthostatic hypotension. 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    CTA lungs 09/01 ordered due to hyperdynamic RV and revealed segmental and subsegmental PE in RLL. Sinus tachycardia felt to be compensatory to surgery and PE w/recs to continue DOAC for 3-6 months. He continues to have persistent leucocytosis and hyponatremia resolving.  Had multiple laxative last night with good results. Pain is controlled with current regimen. PT/OT has been working with patient who requires max assist with transfers, continues to have weak quads--still using KI, min assist +2 for mobility and set up to max assist for ADLs. He was independent and working PTA (lives in [place]).   Patient currently requires max with basic self-care skills secondary to muscle weakness, decreased cardiorespiratoy endurance, and decreased standing balance and decreased balance strategies.  Prior to hospitalization, patient could complete ADLs  with independent .  Patient will benefit from skilled intervention to decrease level of assist with basic self-care skills, increase independence with basic self-care skills, and increase level of independence with iADL prior to discharge home with care partner.  Anticipate  patient will require 24 hour supervision and follow up home health.  OT - End of Session Activity Tolerance: Tolerates 30+ min activity with multiple rests OT Assessment Rehab Potential (ACUTE ONLY): Good OT Barriers to Discharge: Weight bearing restrictions OT Barriers to Discharge  Comments: wBAT,increased pain OT Patient demonstrates impairments in the following area(s): Balance;Endurance;Motor;Pain OT Basic ADL's Functional Problem(s): Grooming;Dressing;Toileting;Bathing OT Transfers Functional Problem(s): Toilet;Tub/Shower OT Additional Impairment(s): None OT Plan OT Intensity: Minimum of 1-2 x/day, 45 to 90 minutes OT Frequency: 5 out of 7 days OT Duration/Estimated Length of Stay: 10-12 days OT Treatment/Interventions: Therapeutic Activities;Functional mobility training;Psychosocial support;Discharge planning;UE/LE Coordination activities;Balance/vestibular training;Self Care/advanced ADL retraining;Therapeutic Exercise;DME/adaptive equipment instruction;UE/LE Strength taining/ROM;Patient/family education;Community reintegration;Pain management;Disease mangement/prevention OT Self Feeding Anticipated Outcome(s): mod I OT Basic Self-Care Anticipated Outcome(s): SUP OT Toileting Anticipated Outcome(s): SUP OT Bathroom Transfers Anticipated Outcome(s): SUP OT Recommendation Recommendations for Other Services: None Patient destination: Home Follow Up Recommendations: None Equipment Recommended: 3 in 1 bedside comode;Rolling walker with 5 wheels;Tub/shower bench Equipment Details: has 2 cpm   OT Evaluation Precautions/Restrictions  Precautions Precautions: Fall;Knee Recall of Precautions/Restrictions: Intact Required Braces or Orthoses: Knee Immobilizer - Left Knee Immobilizer - Right: Other (comment) Knee Immobilizer - Left: Other (comment) Restrictions Weight Bearing Restrictions Per Provider Order: Yes RLE Weight Bearing Per Provider Order: Weight bearing as tolerated LLE Weight Bearing Per Provider Order: Weight bearing as tolerated General Chart Reviewed: Yes PT Missed Treatment Reason: Not applicable Family/Caregiver Present: Yes Vital Signs Therapy Vitals Temp: 98.3 F (36.8 C) Pulse Rate: 92 Resp: 19 BP: (!) 141/68 Patient Position (if  appropriate): Lying Oxygen Therapy SpO2: 100 % O2 Device: Room Air Pain Pain Assessment Pain Scale: 0-10 Pain Score: 4  Pain Location: Knee Pain Orientation: Right;Left Pain Onset: Gradual Pain Intervention(s): Medication (See eMAR) Home Living/Prior Functioning Home Living Family/patient expects to be discharged to:: Private residence Living Arrangements: Spouse/significant other Available Help at Discharge: Family Type of Home: Apartment Home Access: Elevator, Level entry Home Layout: One level Bathroom Shower/Tub: Health visitor: Standard Additional Comments: has two grown children who live in other parts of the state  Lives With: Spouse IADL History Homemaking Responsibilities: Yes Current License: Yes Mode of Transportation: Car Occupation: Full time employment Type of Occupation: Economist Prior Function Level of Independence: Independent with basic ADLs, Independent with gait, Independent with transfers, Independent with homemaking with ambulation  Able to Take Stairs?: Yes Driving: Yes Vocation: Full time employment Leisure: Hobbies-yes (Comment) (gym) Vision Baseline Vision/History: 0 No visual deficits Ability to See in Adequate Light: 0 Adequate Patient Visual Report: No change from baseline Vision Assessment?: No apparent visual deficits Perception  Perception: Within Functional Limits Praxis Praxis: WFL Cognition Cognition Overall Cognitive Status: Within Functional Limits for tasks assessed Arousal/Alertness: Awake/alert Orientation Level: Person;Place;Situation Memory: Appears intact Awareness: Appears intact Problem Solving: Appears intact Safety/Judgment: Appears intact Brief Interview for Mental Status (BIMS) Repetition of Three Words (First Attempt): 3 Temporal Orientation: Year: Correct Temporal Orientation: Month: Accurate within 5 days Temporal Orientation: Day: Correct Recall: Sock: Yes, no cue required Recall:  Blue: Yes, no cue required Recall: Bed: Yes, no cue required BIMS Summary Score: 15 Sensation Sensation Light Touch: Appears Intact Coordination Gross Motor Movements are Fluid and Coordinated: No Fine Motor Movements are Fluid and Coordinated: Yes Finger Nose Finger Test: [facility] Ed Motor  Motor Motor: Within Functional Limits  Trunk/Postural Assessment  Cervical Assessment Cervical Assessment: Within Functional Limits Thoracic Assessment Thoracic Assessment: Within Functional Limits Lumbar Assessment Lumbar Assessment: Within Functional Limits  Postural Control Postural Control: Within Functional Limits  Balance Balance Balance Assessed: Yes Dynamic Sitting Balance Dynamic Sitting - Balance Support: Feet supported Dynamic Sitting - Level of Assistance: 5: Stand by assistance Static Standing Balance Static Standing - Balance Support: Bilateral upper extremity supported Static Standing - Level of Assistance: 4: Min assist Extremity/Trunk Assessment RUE Assessment RUE Assessment: Within Functional Limits General Strength Comments: 5/5 LUE Assessment LUE Assessment: Within Functional Limits General Strength Comments: 5/5  Care Tool Care Tool Self Care Eating   Eating Assist Level: Set up assist    Oral Care    Oral Care Assist Level: Set up assist    Bathing   Body parts bathed by patient: Right arm;Left arm;Chest;Abdomen;Front perineal area;Right upper leg;Left upper leg Body parts bathed by helper: Left lower leg;Right lower leg;Buttocks   Assist Level: Maximal Assistance - Patient 24 - 49%    Upper Body Dressing(including orthotics)       Assist Level: Set up assist    Lower Body Dressing (excluding footwear)     Assist for lower body dressing: Maximal Assistance - Patient 25 - 49%    Putting on/Taking off footwear   What is the patient wearing?: Non-skid slipper socks Assist for footwear: Dependent - Patient 0%       Care Tool Toileting Toileting  activity   Assist for toileting: Maximal Assistance - Patient 25 - 49%     Care Tool Bed Mobility Roll left and right activity   Roll left and right assist level: Minimal Assistance - Patient > 75%    Sit to lying activity   Sit to lying assist level: Minimal Assistance - Patient > 75%    Lying to sitting on side of bed activity   Lying to sitting on side of bed assist level: the ability to move from lying on the back to sitting on the side of the bed with no back support.: Maximal Assistance - Patient 25 - 49%     Care Tool Transfers Sit to stand transfer   Sit to stand assist level: Total Assistance - Patient < 25%    Chair/bed transfer   Chair/bed transfer assist level: Total Assistance - Patient < 25%     Toilet transfer   Assist Level: Total Assistance - Patient < 25%     Care Tool Cognition  Expression of Ideas and Wants Expression of Ideas and Wants: 4. Without difficulty (complex and basic) - expresses complex messages without difficulty and with speech that is clear and easy to understand  Understanding Verbal and Non-Verbal Content Understanding Verbal and Non-Verbal Content: 4. Understands (complex and basic) - clear comprehension without cues or repetitions   Memory/Recall Ability Memory/Recall Ability : Current season;Location of own room;Staff names and faces;That he or she is in a hospital/hospital unit   Refer to Care Plan for Long Term Goals  SHORT TERM GOAL WEEK 1 OT Short Term Goal 1 (Week 1): patient will complete sit to stand for LB ADLs with mod A OT Short Term Goal 2 (Week 1): patient will complete toileting 3/3 with mod A OT Short Term Goal 3 (Week 1): patient will demo ability to complete toilet transfers with mod A OT Short Term Goal 4 (Week 1): patient will demo ability to complete LB bathing with mod A  Recommendations for other services: None    Skilled Therapeutic Intervention  Evaluation completed (see details above) with education on OT POC  and goals and individual treatment initiated with focus on functional  mobility/transfers, ADL re-training,  generalized strengthening and endurance, dynamic standing balance/coordination, pt/family education and discharge planning. Pt education provided on therapy schedule and safety policy with use of chair alarm. Pt participated in goal setting. Pt participated in modified ADL task (See above for functional performance).  Patient completed sit to stands with functional mobility 5 ft forward and backwards to increase ability to WB through LE. Patient able to complete WB through UE through walker due to increased UE strength. Patient demonstrates difficulty with stands and sits due to increased pain and decreased LE ROM.   ADL ADL Eating: Set up Grooming: Setup Upper Body Bathing: Supervision/safety Where Assessed-Upper Body Bathing: Edge of bed Lower Body Bathing: Moderate assistance Where Assessed-Lower Body Bathing: Bed level;Edge of bed Upper Body Dressing: Setup Where Assessed-Upper Body Dressing: Edge of bed Lower Body Dressing: Maximal assistance Where Assessed-Lower Body Dressing: Edge of bed Toileting: Maximal assistance Where Assessed-Toileting: Bedside Commode;Bed level Toilet Transfer: Maximal assistance Toilet Transfer Method: Event organiser: Maximal assistance Film/video editor Method: Designer, industrial/product: Grab bars Mobility  Bed Mobility Bed Mobility: Rolling Right;Rolling Left Rolling Right: Minimal Assistance - Patient > 75%;Moderate Assistance - Patient 50-74% Rolling Left: Minimal Assistance - Patient > 75%;Moderate Assistance - Patient 50-74% (bed rails) Transfers Sit to Stand: Total Assistance - Patient < 25% Stand to Sit: Total Assistance - Patient < 25%   Discharge Criteria: Patient will be discharged from OT if patient refuses treatment 3 consecutive times without medical reason, if treatment goals not met, if there is  a change in medical status, if patient makes no progress towards goals or if patient is discharged from hospital.  The above assessment, treatment plan, treatment alternatives and goals were discussed and mutually agreed upon: by patient and by family  D'mariea L Infinity Jeffords 04/23/2024, 10:24 AM

## 2024-04-23 NOTE — Progress Notes (Signed)
 Inpatient Rehabilitation Admission Medication Review by a Pharmacist  A complete drug regimen review was completed for this patient to identify any potential clinically significant medication issues.  High Risk Drug Classes Is patient taking? Indication by Medication  Antipsychotic Yes, as an intravenous medication Prochlorperazine  for nausea   Anticoagulant Yes Rivaroxaban  for DVT  Antibiotic No   Opioid Yes Oxycodone  for acute pain   Antiplatelet No   Hypoglycemics/insulin No   Vasoactive Medication No   Chemotherapy No   Other Yes Gabapentin  for nerve pain Methocarbamol  for muscle relaxing Miralax , senna, bisacodyl , fleets for constipation Acetaminophen  for mild pain Tums for indigestion Diphenhydramine  for itching  Robitussin for cough Melatonin for sleep Simethicone  for gas      Type of Medication Issue Identified Description of Issue Recommendation(s)  Drug Interaction(s) (clinically significant)     Duplicate Therapy     Allergy     No Medication Administration End Date     Incorrect Dose     Additional Drug Therapy Needed     Significant med changes from prior encounter (inform family/care partners about these prior to discharge). HELD home vitamins, mag, K, celecoxib  NEW gabapentin , methocarbamol , miralax , senna, rivaroxaban  Review at discharge  Other       Clinically significant medication issues were identified that warrant physician communication and completion of prescribed/recommended actions by midnight of the next day:  No  Name of provider notified for urgent issues identified:   Provider Method of Notification:     Pharmacist comments:   Time spent performing this drug regimen review (minutes):  20  Jinnie Door, PharmD, BCPS, [facility] Clinical Pharmacist  Please check AMION for all [facility] phone numbers After 10:00 PM, call Main Pharmacy [phone]

## 2024-04-23 NOTE — Progress Notes (Signed)
   04/23/24 1410  Assess: MEWS Score  Temp 100.1 F (37.8 C)  BP 129/70  MAP (mmHg) 85  Pulse Rate (!) 118  Resp (!) 24  SpO2 100 %  O2 Device Room Air  Assess: MEWS Score  MEWS Temp 0  MEWS Systolic 0  MEWS Pulse 2  MEWS RR 1  MEWS LOC 0  MEWS Score 3  MEWS Score Color Yellow  Assess: if the MEWS score is Yellow or Red  Were vital signs accurate and taken at a resting state? Yes  Does the patient meet 2 or more of the SIRS criteria? No  MEWS guidelines implemented  Yes, yellow  Treat  MEWS Interventions Considered administering scheduled or prn medications/treatments as ordered  Take Vital Signs  Increase Vital Sign Frequency  Yellow: Q2hr x1, continue Q4hrs until patient remains green for 12hrs  Escalate  MEWS: Escalate Yellow: Discuss with charge nurse and consider notifying provider and/or RRT  Notify: Charge Nurse/RN  Name of Charge Nurse/RN Notified Faby,RN  Provider Notification  Provider Name/Title Pam PA  Date Provider Notified 04/23/24  Time Provider Notified 1418  Method of Notification Call  Notification Reason Change in status  Provider response See new orders  Date of Provider Response 04/23/24  Time of Provider Response 1420  Assess: SIRS CRITERIA  SIRS Temperature  0  SIRS Respirations  1  SIRS Pulse 1  SIRS WBC 0  SIRS Score Sum  2

## 2024-04-23 NOTE — Plan of Care (Signed)
  Problem: RH Balance Goal: LTG: Patient will maintain dynamic sitting balance (OT) Description: LTG:  Patient will maintain dynamic sitting balance with assistance during activities of daily living (OT) Flowsheets (Taken 04/23/2024 1037) LTG: Pt will maintain dynamic sitting balance during ADLs with: Independent with assistive device Goal: LTG Patient will maintain dynamic standing with ADLs (OT) Description: LTG:  Patient will maintain dynamic standing balance with assist during activities of daily living (OT)  Flowsheets (Taken 04/23/2024 1037) LTG: Pt will maintain dynamic standing balance during ADLs with: Supervision/Verbal cueing   Problem: Sit to Stand Goal: LTG:  Patient will perform sit to stand in prep for activites of daily living with assistance level (OT) Description: LTG:  Patient will perform sit to stand in prep for activites of daily living with assistance level (OT) Flowsheets (Taken 04/23/2024 1037) LTG: PT will perform sit to stand in prep for activites of daily living with assistance level: Supervision/Verbal cueing   Problem: RH Grooming Goal: LTG Patient will perform grooming w/assist,cues/equip (OT) Description: LTG: Patient will perform grooming with assist, with/without cues using equipment (OT) Flowsheets (Taken 04/23/2024 1037) LTG: Pt will perform grooming with assistance level of: Independent with assistive device    Problem: RH Bathing Goal: LTG Patient will bathe all body parts with assist levels (OT) Description: LTG: Patient will bathe all body parts with assist levels (OT) Flowsheets (Taken 04/23/2024 1037) LTG: Pt will perform bathing with assistance level/cueing: Supervision/Verbal cueing   Problem: RH Dressing Goal: LTG Patient will perform upper body dressing (OT) Description: LTG Patient will perform upper body dressing with assist, with/without cues (OT). Flowsheets (Taken 04/23/2024 1037) LTG: Pt will perform upper body dressing with assistance level of:  Independent with assistive device Goal: LTG Patient will perform lower body dressing w/assist (OT) Description: LTG: Patient will perform lower body dressing with assist, with/without cues in positioning using equipment (OT) Flowsheets (Taken 04/23/2024 1037) LTG: Pt will perform lower body dressing with assistance level of: Supervision/Verbal cueing   Problem: RH Toileting Goal: LTG Patient will perform toileting task (3/3 steps) with assistance level (OT) Description: LTG: Patient will perform toileting task (3/3 steps) with assistance level (OT)  Flowsheets (Taken 04/23/2024 1037) LTG: Pt will perform toileting task (3/3 steps) with assistance level: Supervision/Verbal cueing   Problem: RH Toilet Transfers Goal: LTG Patient will perform toilet transfers w/assist (OT) Description: LTG: Patient will perform toilet transfers with assist, with/without cues using equipment (OT) Flowsheets (Taken 04/23/2024 1037) LTG: Pt will perform toilet transfers with assistance level of: Supervision/Verbal cueing   Problem: RH Tub/Shower Transfers Goal: LTG Patient will perform tub/shower transfers w/assist (OT) Description: LTG: Patient will perform tub/shower transfers with assist, with/without cues using equipment (OT) Flowsheets (Taken 04/23/2024 1037) LTG: Pt will perform tub/shower stall transfers with assistance level of: Supervision/Verbal cueing

## 2024-04-23 NOTE — Progress Notes (Addendum)
 Occupational Therapy Session Note  Patient Details  Name: Roger Pace MRN: [id number] Date of Birth: June 22, 1971  Today's Date: 04/23/2024 OT Individual Time: 8592-8545 OT Individual Time Calculation (min): 47 min  and Today's Date: 04/23/2024 OT Missed Time: 5 Minutes Missed Time Reason: X-Ray   Short Term Goals: Week 1:  OT Short Term Goal 1 (Week 1): patient will complete sit to stand for LB ADLs with mod A OT Short Term Goal 2 (Week 1): patient will complete toileting 3/3 with mod A OT Short Term Goal 3 (Week 1): patient will demo ability to complete toilet transfers with mod A OT Short Term Goal 4 (Week 1): patient will demo ability to complete LB bathing with mod A  Skilled Therapeutic Interventions/Progress Updates:  Patient in bed getting vitals taken when OT arrived. Patient completed bed mobility min A with rails. Patient completed sit to stand from elevated bed and functional mobility 25 ft with RW with CGA. Patient completed bed scoots 3x side to side with rest breaks to increase LE strength and ability to complete lateral transfers. Transport came to take patient for x ray  Therapy Documentation Precautions:  Precautions Precautions: Fall, Knee Recall of Precautions/Restrictions: Intact Required Braces or Orthoses: Knee Immobilizer - Left Knee Immobilizer - Right: Other (comment) Knee Immobilizer - Left: Other (comment) Restrictions Weight Bearing Restrictions Per Provider Order: Yes RLE Weight Bearing Per Provider Order: Weight bearing as tolerated LLE Weight Bearing Per Provider Order: Weight bearing as tolerated  Therapy/Group: Individual Therapy  D'mariea L Akyah Lagrange 04/23/2024, 2:59 PM

## 2024-04-23 NOTE — Plan of Care (Signed)
  Problem: RH Balance Goal: LTG Patient will maintain dynamic sitting balance (PT) Description: LTG:  Patient will maintain dynamic sitting balance with assistance during mobility activities (PT) Flowsheets (Taken 04/23/2024 1630) LTG: Pt will maintain dynamic sitting balance during mobility activities with:: Supervision/Verbal cueing Goal: LTG Patient will maintain dynamic standing balance (PT) Description: LTG:  Patient will maintain dynamic standing balance with assistance during mobility activities (PT) Flowsheets (Taken 04/23/2024 1630) LTG: Pt will maintain dynamic standing balance during mobility activities with:: Supervision/Verbal cueing   Problem: Sit to Stand Goal: LTG:  Patient will perform sit to stand with assistance level (PT) Description: LTG:  Patient will perform sit to stand with assistance level (PT) Flowsheets (Taken 04/23/2024 1630) LTG: PT will perform sit to stand in preparation for functional mobility with assistance level: Supervision/Verbal cueing   Problem: RH Bed Mobility Goal: LTG Patient will perform bed mobility with assist (PT) Description: LTG: Patient will perform bed mobility with assistance, with/without cues (PT). Flowsheets (Taken 04/23/2024 1630) LTG: Pt will perform bed mobility with assistance level of: Independent with assistive device    Problem: RH Bed to Chair Transfers Goal: LTG Patient will perform bed/chair transfers w/assist (PT) Description: LTG: Patient will perform bed to chair transfers with assistance (PT). Flowsheets (Taken 04/23/2024 1630) LTG: Pt will perform Bed to Chair Transfers with assistance level: Supervision/Verbal cueing   Problem: RH Car Transfers Goal: LTG Patient will perform car transfers with assist (PT) Description: LTG: Patient will perform car transfers with assistance (PT). Flowsheets (Taken 04/23/2024 1630) LTG: Pt will perform car transfers with assist:: Minimal Assistance - Patient > 75%   Problem: RH  Ambulation Goal: LTG Patient will ambulate in controlled environment (PT) Description: LTG: Patient will ambulate in a controlled environment, # of feet with assistance (PT). Flowsheets (Taken 04/23/2024 1630) LTG: Pt will ambulate in controlled environ  assist needed:: Supervision/Verbal cueing LTG: Ambulation distance in controlled environment: 150 feet with LRAD Goal: LTG Patient will ambulate in home environment (PT) Description: LTG: Patient will ambulate in home environment, # of feet with assistance (PT). Flowsheets (Taken 04/23/2024 1630) LTG: Pt will ambulate in home environ  assist needed:: Supervision/Verbal cueing LTG: Ambulation distance in home environment: 50 feet with LRAD

## 2024-04-23 NOTE — Progress Notes (Signed)
 Inpatient Rehabilitation Care Coordinator Assessment and Plan Patient Details  Name: Roger Pace MRN: [id number] Date of Birth: 01/30/1971  Today's Date: 04/23/2024  Hospital Problems: Principal Problem:   OA (osteoarthritis) of knee Active Problems:   Hyponatremia   ABLA (acute blood loss anemia)   Leukocytosis   Constipation   Acute pulmonary embolism (HCC)  Past Medical History:  Past Medical History:  Diagnosis Date   Abnormal LFTs    since teenager   Arthritis    H/O hyperglycemia    History of toe fracture 2018   R-4th MT Fx   History of umbilical hernia    Hypersomnia    Injury of left shoulder 2001   Past Surgical History:  Past Surgical History:  Procedure Laterality Date   HARDWARE REMOVAL Right 04/14/2024   Procedure: REMOVAL, HARDWARE;  Surgeon: Addie Cordella Hamilton, MD;  Location: [facility] OR;  Service: Orthopedics;  Laterality: Right;   KNEE SURGERY Bilateral    arthroscopy on each knee, and reconstruction on right knee   LAPAROSCOPY N/A 09/18/2023   Procedure: DIAGNOSTIC LAPAROSCOPY;  Surgeon: Ebbie Cough, MD;  Location: [facility] OR;  Service: General;  Laterality: N/A;   SHOULDER SURGERY Left    repaired humerus (shoulder kept dislocating)   TOTAL KNEE ARTHROPLASTY Bilateral 04/14/2024   Procedure: ARTHROPLASTY, KNEE, BILATERAL, TOTAL;  Surgeon: Addie Cordella Hamilton, MD;  Location: [facility] OR;  Service: Orthopedics;  Laterality: Bilateral;   UMBILICAL HERNIA REPAIR N/A 09/18/2023   Procedure: UMBILICAL HERNIA REPAIR;  Surgeon: Ebbie Cough, MD;  Location: [facility] OR;  Service: General;  Laterality: N/A;  RNFA GEN & TAP BLOCK   Social History:  reports that he has never smoked. He has never used smokeless tobacco. He reports that he does not drink alcohol and does not use drugs.  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 Children: 2 children from separate  relationships Anticipated Caregiver: Spouse, Roger Pace Ability/Limitations of Caregiver: Min assist Caregiver Availability: 24/7 Family Dynamics: Good family support  Social History Preferred language: English Religion: Control and instrumentation engineer Education: [employer] - How often do you need to have someone help you when you read instructions, pamphlets, or other written material from your doctor or pharmacy?: Never Writes: Yes Employment Status: Employed Name of Employer: [employer] of Employment: 28 Return to Work Plans: Plans to return to work   Abuse/Neglect Abuse/Neglect Assessment Can Be Completed: Yes Physical Abuse: Denies Verbal Abuse: Denies Sexual Abuse: Denies Exploitation of patient/patient's resources: Denies Self-Neglect: Denies  Patient response to: Social Isolation - How often do you feel lonely or isolated from those around you?: Never  Emotional Status Pt's affect, behavior and adjustment status: Patient is in good spirits. Adjusting to therapy Recent Psychosocial Issues: None Psychiatric History: None Substance Abuse History: None  Patient / Family Perceptions, Expectations & Goals Pt/Family understanding of illness & functional limitations: Patient/family understanding of illness & functional limitations Premorbid pt/family roles/activities: Spouse, employee, active traveler Anticipated changes in roles/activities/participation: None Pt/family expectations/goals: Patient anticipates being able to return back to 100% within a month of discharge  [employer]: None Premorbid Home Care/DME Agencies: None Transportation available at discharge: Yes, Roger Pace Is the patient able to respond to transportation needs?: Yes In the past 12 months, has lack of transportation kept you from medical appointments or from getting medications?: No In the past 12 months, has lack of transportation kept  you from meetings, work,  or from getting things needed for daily living?: No  Discharge Planning Living Arrangements: Spouse/significant other Support Systems: Spouse/significant other, Children Type of Residence: Private residence Insurance Resources: Media planner (specify) ([employer] / Advertising copywriter OTHER) Financial Resources: Employment Financial Screen Referred: No Living Expenses: Rent Money Management: Patient, Spouse Does the patient have any problems obtaining your medications?: No Home Management: Self Patient/Family Preliminary Plans: TBD Care Coordinator Anticipated Follow Up Needs: HH/OP Expected length of stay: 10-12 days  Clinical Impression CSW met with patient/family to introduce herself and complete initial assessment. Patient is AxOx4 and able to make all needs known. Patient is a 53 y/o male who admitted to [facility] for bilateral TKA c/b bilat DVT, R LL PE, and orthostatic hypotension. Patient lives with his spouse, Roger Pace in a single-level home. 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 He plans to return back to work at least a month after discharge. His therapy goal is to also be able to drive within a month or so after discharge. He has DME: rollator as well as another piece of equipment. Transportation will be provided by [place] upon discharge. There were no further needs or concerns at present. CSW will continue to follow.   Di[employer]Asia  Loreli 04/23/2024, 1:45 PM

## 2024-04-23 NOTE — Evaluation (Signed)
 Physical Therapy Assessment and Plan  Patient Details  Name: Roger Pace MRN: [id number] Date of Birth: Mar 10, 1971  PT Diagnosis: Abnormality of gait, Difficulty walking, and Muscle weakness Rehab Potential: Fair ELOS: 12-15 days   Today's Date: 04/23/2024 PT Individual Time: 8952-8841 PT Individual Time Calculation (min): 71 min    Hospital Problem: Principal Problem:   OA (osteoarthritis) of knee Active Problems:   Hyponatremia   ABLA (acute blood loss anemia)   Leukocytosis   Constipation   Acute pulmonary embolism (HCC)   Past Medical History:  Past Medical History:  Diagnosis Date   Abnormal LFTs    since teenager   Arthritis    H/O hyperglycemia    History of toe fracture 2018   R-4th MT Fx   History of umbilical hernia    Hypersomnia    Injury of left shoulder 2001   Past Surgical History:  Past Surgical History:  Procedure Laterality Date   HARDWARE REMOVAL Right 04/14/2024   Procedure: REMOVAL, HARDWARE;  Surgeon: Addie Cordella Hamilton, MD;  Location: [facility] OR;  Service: Orthopedics;  Laterality: Right;   KNEE SURGERY Bilateral    arthroscopy on each knee, and reconstruction on right knee   LAPAROSCOPY N/A 09/18/2023   Procedure: DIAGNOSTIC LAPAROSCOPY;  Surgeon: Ebbie Cough, MD;  Location: [facility] OR;  Service: General;  Laterality: N/A;   SHOULDER SURGERY Left    repaired humerus (shoulder kept dislocating)   TOTAL KNEE ARTHROPLASTY Bilateral 04/14/2024   Procedure: ARTHROPLASTY, KNEE, BILATERAL, TOTAL;  Surgeon: Addie Cordella Hamilton, MD;  Location: [facility] OR;  Service: Orthopedics;  Laterality: Bilateral;   UMBILICAL HERNIA REPAIR N/A 09/18/2023   Procedure: UMBILICAL HERNIA REPAIR;  Surgeon: Ebbie Cough, MD;  Location: [facility] OR;  Service: General;  Laterality: N/A;  RNFA GEN & TAP BLOCK    Assessment & Plan Clinical Impression: Patient is a 53 y.o. year old male with history of abnormal LFTs, bilateral knee surgeries in the past w/OA bilateral knees  with failure on conservative management. He was admitted on 04/14/24 for B-TKR by Dr. Vernetta. Post op noted to have fevers and intermittent tachycardia therefore BLE dopplers ordered which revealed BLE DVT. He was started on Xarelto  on 08/28 but continued to have issues with fevers as well as dizziness w/orthostatic hypotension. 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    CTA lungs 09/01 ordered due to hyperdynamic RV and revealed segmental and subsegmental PE in RLL. Sinus tachycardia felt to be compensatory to surgery and PE w/recs to continue DOAC for 3-6 months. He continues to have persistent leucocytosis and hyponatremia resolving.  Had multiple laxative last night with good results. Pain is controlled with current regimen. PT/OT has been working with patient who requires max assist with transfers, continues to have weak quads--still using KI, min assist +2 for mobility and set up to max assist for ADLs. He was independent and working PTA (lives in [place]).    Patient currently requires mod with mobility secondary to muscle weakness and muscle joint tightness, decreased cardiorespiratoy endurance, and decreased standing balance and decreased balance strategies.  Prior to hospitalization, patient was independent  with mobility and lived with Spouse Everlina) in a Apartment home.  Home access is  Elevator, Level entry.  Patient will benefit from skilled PT intervention to maximize safe functional mobility, minimize fall risk, and decrease caregiver burden for planned discharge home with  intermittent assist.  Anticipate patient will benefit from follow up OP at discharge.  PT - End of Session Activity Tolerance: Tolerates 30+ min activity with multiple rests Endurance Deficit: Yes PT Assessment Rehab Potential (ACUTE/IP ONLY): Fair PT Barriers to Discharge: Wound  Care;Weight PT Barriers to Discharge Comments: pain PT Patient demonstrates impairments in the following area(s): Balance;Edema;Endurance;Pain;Safety;Skin Integrity PT Transfers Functional Problem(s): Bed Mobility;Bed to Chair;Car PT Locomotion Functional Problem(s): Ambulation;Wheelchair Mobility PT Plan PT Intensity: Minimum of 1-2 x/day ,45 to 90 minutes PT Frequency: 5 out of 7 days PT Duration Estimated Length of Stay: 12-15 days PT Treatment/Interventions: Ambulation/gait training;Disease management/prevention;Pain management;Stair training;Visual/perceptual remediation/compensation;Balance/vestibular training;DME/adaptive equipment instruction;Therapeutic Activities;Patient/family education;Wheelchair propulsion/positioning;Cognitive remediation/compensation;Functional electrical stimulation;Psychosocial support;Therapeutic Exercise;Community reintegration;Functional mobility training;Skin care/wound management;UE/LE Strength taining/ROM;Discharge planning;Neuromuscular re-education;Splinting/orthotics;UE/LE Coordination activities PT Transfers Anticipated Outcome(s): supervision PT Locomotion Anticipated Outcome(s): supervision PT Recommendation Recommendations for Other Services: Neuropsych consult;Therapeutic Recreation consult Therapeutic Recreation Interventions: Pet therapy;Stress management Follow Up Recommendations: Home health PT;Outpatient PT Patient destination: Home Equipment Recommended: To be determined   PT Evaluation Precautions/Restrictions Precautions Precautions: Fall;Knee Recall of Precautions/Restrictions: Intact Precaution/Restrictions Comments: Pt educated on need for knee extension at this time. Bed locked in extension, and education provided that NO pillow/roll/ice pack should be placed under the knee. Required Braces or Orthoses: Knee Immobilizer - Left Knee Immobilizer - Right: Other (comment) (Dr. Addie, try to alternate knee immobilizer between legs to  allow each leg the chance to bear more of the load when mobilizing.) Knee Immobilizer - Left: Other (comment) (Dr. Addie, try to alternate knee immobilizer between legs to allow each leg the chance to bear more of the load when mobilizing.) Restrictions Weight Bearing Restrictions Per Provider Order: Yes RLE Weight Bearing Per Provider Order: Weight bearing as tolerated LLE Weight Bearing Per Provider Order: Weight bearing as tolerated Pain Interference Pain Interference Pain Effect on Sleep: 1. Rarely or not at all Pain Interference with Therapy Activities: 1. Rarely or not at all Pain Interference with Day-to-Day Activities: 1. Rarely or not at all Home Living/Prior Functioning Home Living Living Arrangements: Spouse/significant other Available Help at Discharge: Family;Available 24 hours/day Type of Home: Apartment Home Access: Elevator;Level entry Home Layout: One level Bathroom Shower/Tub: Health visitor: Standard (pt has BSC (provided prior to surgery)) Bathroom Accessibility: Yes  Lives With: Spouse Everlina) Prior Function Level of Independence: Independent with basic ADLs;Independent with gait;Independent with transfers;Independent with homemaking with ambulation  Able to Take Stairs?: Yes Driving: Yes Vocation: Full time employment 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 Leisure: Hobbies-yes (Comment) (working out, music) Vision/Perception  Vision - History Ability to See in Adequate Light: 0 Adequate Perception Perception: Within Functional Limits Praxis Praxis: WFL  Cognition Overall Cognitive Status: Within Functional Limits for tasks assessed Arousal/Alertness: Awake/alert Orientation Level: Oriented X4 Year: 2025 Month: August Day of Week: Correct Memory: Appears intact Awareness: Appears intact Problem Solving: Appears intact Safety/Judgment: Appears intact Sensation Sensation Light Touch: Appears  Intact Coordination Gross Motor Movements are Fluid and Coordinated: No Fine Motor Movements are Fluid and Coordinated: Yes Coordination and Movement Description: coordination impaired by strength/ROM deficits/pain; LLE impairements > [name] impairments Finger Nose Finger Test: [facility] Motor  Motor Motor: Within Functional Limits   Trunk/Postural Assessment  Cervical Assessment Cervical Assessment: Within Functional Limits Thoracic Assessment Thoracic Assessment: Within Functional Limits Lumbar Assessment Lumbar Assessment: Within Functional Limits Postural Control Postural Control: Within Functional Limits  Balance Balance Balance Assessed: Yes Dynamic Sitting Balance Dynamic Sitting - Balance Support: Feet supported Dynamic Sitting - Level of  Assistance: 5: Stand by Theatre manager Standing - Balance Support: Bilateral upper extremity supported Static Standing - Level of Assistance: 4: Min assist Dynamic Standing Balance Dynamic Standing - Balance Support: Bilateral upper extremity supported;During functional activity Dynamic Standing - Level of Assistance: 4: Min assist;3: Mod assist Extremity Assessment  RLE Assessment RLE Assessment: Exceptions to [facility] Passive Range of Motion (PROM) Comments: passive knee flexion ~90 deg, passive knee extension lacking ~15 deg Active Range of Motion (AROM) Comments: active knee extension significnatly limited by pain, active knee flexion to ~75 deg General Strength Comments: grossly 2/5 with exception of ankle DF/PF 4/5. LLE Assessment LLE Assessment: Exceptions to [facility] Passive Range of Motion (PROM) Comments: passive knee extension lacking ~20 deg, passive knee flexion ~75 deg Active Range of Motion (AROM) Comments: active knee extension limited significantly by pain/weakness, active knee flexion ~60 deg  Care Tool Care Tool Bed Mobility Roll left and right activity   Roll left and right assist level: Moderate  Assistance - Patient 50 - 74%    Sit to lying activity   Sit to lying assist level: Moderate Assistance - Patient 50 - 74%    Lying to sitting on side of bed activity   Lying to sitting on side of bed assist level: the ability to move from lying on the back to sitting on the side of the bed with no back support.: Moderate Assistance - Patient 50 - 74%     Care Tool Transfers Sit to stand transfer   Sit to stand assist level: Moderate Assistance - Patient 50 - 74%    Chair/bed transfer   Chair/bed transfer assist level: Moderate Assistance - Patient 50 - 74%    Car transfer Car transfer activity did not occur: Safety/medical concerns (fatigue)        Care Tool Locomotion Ambulation   Assist level: Minimal Assistance - Patient > 75% Assistive device: Walker-rolling Max distance: 40  Walk 10 feet activity   Assist level: Minimal Assistance - Patient > 75% Assistive device: Walker-rolling   Walk 50 feet with 2 turns activity Walk 50 feet with 2 turns activity did not occur: Safety/medical concerns (fatigue/pain)      Walk 150 feet activity Walk 150 feet activity did not occur: Safety/medical concerns      Walk 10 feet on uneven surfaces activity Walk 10 feet on uneven surfaces activity did not occur: Safety/medical concerns      Stairs Stair activity did not occur: Safety/medical concerns        Walk up/down 1 step activity Walk up/down 1 step or curb (drop down) activity did not occur: Safety/medical concerns      Walk up/down 4 steps activity Walk up/down 4 steps activity did not occur: Safety/medical concerns      Walk up/down 12 steps activity Walk up/down 12 steps activity did not occur: Safety/medical concerns      Pick up small objects from floor   Pick up small object from the floor assist level: Total Assistance - Patient < 25%    Wheelchair Is the patient using a wheelchair?: Yes Type of Wheelchair: Manual   Wheelchair assist level: Supervision/Verbal  cueing Max wheelchair distance: 150+  Wheel 50 feet with 2 turns activity   Assist Level: Supervision/Verbal cueing  Wheel 150 feet activity   Assist Level: Supervision/Verbal cueing    Refer to Care Plan for Long Term Goals  SHORT TERM GOAL WEEK 1 PT Short Term Goal 1 (Week 1): pt will  perform sit to stand with RW and min A with LRAD PT Short Term Goal 2 (Week 1): pt will ambulate 100 feet with LRAD and min A PT Short Term Goal 3 (Week 1): pt will demonstrate ability to actively extend [name] to -5 deg or less to reduce dependence on knee immobilizer  Recommendations for other services: Neuropsych and Therapeutic Recreation  Pet therapy and Stress management  Skilled Therapeutic Intervention Mobility Bed Mobility Bed Mobility: Rolling Right;Rolling Left;Sit to Supine;Supine to Sit Rolling Right: Moderate Assistance - Patient 50-74% Rolling Left: Moderate Assistance - Patient 50-74% Supine to Sit: Moderate Assistance - Patient 50-74% Sit to Supine: Moderate Assistance - Patient 50-74% Transfers Transfers: Sit to Stand;Stand to Sit;Stand Pivot Transfers Sit to Stand: Moderate Assistance - Patient 50-74% Stand to Sit: Moderate Assistance - Patient 50-74% Stand Pivot Transfers: Moderate Assistance - Patient 50 - 74% Stand Pivot Transfer Details: Tactile cues for sequencing;Verbal cues for technique;Tactile cues for weight beaing;Verbal cues for safe use of DME/AE;Verbal cues for precautions/safety Transfer (Assistive device): Rolling walker Locomotion  Gait Ambulation: Yes Gait Assistance: Minimal Assistance - Patient > 75% Gait Distance (Feet): 40 Feet Assistive device: Rolling walker Gait Assistance Details: Verbal cues for gait pattern;Verbal cues for sequencing;Visual cues for safe use of DME/AE;Verbal cues for precautions/safety Gait Gait: Yes Gait Pattern: Impaired Gait Pattern: Antalgic;Decreased hip/knee flexion - right;Step-to pattern Gait velocity: Decreased Stairs /  Additional Locomotion Stairs: No Wheelchair Mobility Wheelchair Mobility: Yes Wheelchair Assistance: Doctor, general practice: Both upper extremities Wheelchair Parts Management: Needs assistance Distance: 150+   Discharge Criteria: Patient will be discharged from PT if patient refuses treatment 3 consecutive times without medical reason, if treatment goals not met, if there is a change in medical status, if patient makes no progress towards goals or if patient is discharged from hospital.  The above assessment, treatment plan, treatment alternatives and goals were discussed and mutually agreed upon: by patient and by family  Today's Interventions:   Evaluation completed (see details above and below) with education on PT POC and goals and individual treatment initiated with focus on transfer training.   Pt wearing [name] ted hose, and L LE knee immobilizer. Order placed for abdominal binder however pt reports he does not have abdominal binder. Notified MD. No evidence of dizziness/lightheadedness during session.    Pt and MD confirm per Dr. Addie, try to alternate knee immobilizer between legs to allow each leg the chance to bear more of the load when mobilizing. Dr. Murray followed up with Dr. Addie and reports  can dc alternate knee immobilizers when he can do 10 straight leg raises with each leg.   Pt also has B CPM in room, and bone foam in room. Pt and pt wife confirm they have been trained for how to set up each and are aware of need to alternate between each when pt in bed to progress pt ROM.  Bed mobility: mod A Sit to stand: mod A with increased time  Stand pivot transfer: mod A for power up, min A for pivot with RW  Gait x40 feet with RW and min A Lateral scoot transfer WC to bed with min A however pt reports abdominal cramp at end of transfer requiring trunk extensin for relief. Pt reports abdominal cramps at baseline.   Pt supine in bed at end of session  with all needs within reach and bed alarm on.    [facility], [place], DPT  04/23/2024, 4:20  PM

## 2024-04-23 NOTE — Progress Notes (Signed)
 PROGRESS NOTE   Subjective/Complaints: No new concerns or complaints this morning.  Therapy asking about duration of alternating knee braces.  Pain overall controlled.  Later in the day patient was noted to have low-grade fever.  ROS: Patient denies new vision changes, dizziness, nausea, vomiting, diarrhea,  shortness of breath or chest pain, headache, or mood change. + fever low-grade this afternoon  Objective:   No results found. Recent Labs    04/23/24 0541  WBC 12.7*  HGB 7.3*  HCT 21.8*  PLT 518*   Recent Labs    04/22/24 0334 04/23/24 0541  NA 131* 134*  K 4.3 4.5  CL 96* 99  CO2 25 20*  GLUCOSE 115* 122*  BUN 15 15  CREATININE 0.99 1.06  CALCIUM  8.2* 8.5*    Intake/Output Summary (Last 24 hours) at 04/23/2024 1604 Last data filed at 04/23/2024 0900 Gross per 24 hour  Intake 338 ml  Output 1275 ml  Net -937 ml        Physical Exam: Vital Signs Blood pressure 135/69, pulse (!) 107, temperature 98.9 F (37.2 C), resp. rate (!) 21, height 5' 11.5 (1.816 m), weight 118 kg, SpO2 96%.  General: No apparent distress HEENT: Head is normocephalic, atraumatic, sclera anicteric, oral mucosa pink and moist Neck: Supple without JVD or lymphadenopathy Heart: Reg rate and rhythm. No murmurs rubs or gallops Chest: CTA bilaterally without wheezes, non labored Abdomen: Soft, non-tender, mildly-distended, bowel sounds positive. Extremities: Mild LE edema, b/l TKA incisions with dressing CDI Psych: Pt's affect is appropriate. Pt is cooperative Skin: Clean and intact without signs of breakdown Neuro:     Mental Status: AAOx3, good insight and awareness Speech/Languate: Follows commands CRANIAL NERVES: 2-12 grossly intact   MOTOR: RUE: 5/5 Deltoid, 5/5 Biceps, 5/5 Triceps,5/5 Grip LUE: 5/5 Deltoid, 5/5 Biceps, 5/5 Triceps, 5/5 Grip RLE: HF 2/5, KE 2/5, ADF 4/5, APF 4/5 LLE: HF 2/5, KE 2/5, ADF 4/5, APF 4/5    SENSORY: Normal to touch all 4 extremities  MSK: Right knee immobilizer on left leg   Prior neuro assessment is c/w today's exam 04/23/2024.   Assessment/Plan: 1. Functional deficits which require 3+ hours per day of interdisciplinary therapy in a comprehensive inpatient rehab setting. Physiatrist is providing close team supervision and 24 hour management of active medical problems listed below. Physiatrist and rehab team continue to assess barriers to discharge/monitor patient progress toward functional and medical goals  Care Tool:  Bathing    Body parts bathed by patient: Right arm, Left arm, Chest, Abdomen, Front perineal area, Right upper leg, Left upper leg   Body parts bathed by helper: Left lower leg, Right lower leg, Buttocks     Bathing assist Assist Level: Maximal Assistance - Patient 24 - 49%     Upper Body Dressing/Undressing Upper body dressing        Upper body assist Assist Level: Set up assist    Lower Body Dressing/Undressing Lower body dressing            Lower body assist Assist for lower body dressing: Maximal Assistance - Patient 25 - 49%     Toileting Toileting    Toileting assist Assist for  toileting: Maximal Assistance - Patient 25 - 49%     Transfers Chair/bed transfer  Transfers assist     Chair/bed transfer assist level: Total Assistance - Patient < 25%     Locomotion Ambulation   Ambulation assist              Walk 10 feet activity   Assist           Walk 50 feet activity   Assist           Walk 150 feet activity   Assist           Walk 10 feet on uneven surface  activity   Assist           Wheelchair     Assist               Wheelchair 50 feet with 2 turns activity    Assist            Wheelchair 150 feet activity     Assist          Blood pressure 135/69, pulse (!) 107, temperature 98.9 F (37.2 C), resp. rate (!) 21, height 5' 11.5 (1.816 m),  weight 118 kg, SpO2 96%.  Medical Problem List and Plan: 1. Functional deficits secondary to B/L knee OA s/p b/l TKA complicated by acute PE             -patient may shower, cover incisions please             -ELOS/Goals: 12-15 days, PT/OT Mod I             - Continue CIR  -Discussed with Dr. Addie, appreciate assistance, pt to alternate knee immobilizers between legs, he can DC this when he can do 10 straight leg raises with each leg  2.  Acute DVT/PE/Antithrombotics: -DVT/anticoagulation:  Pharmaceutical: Xarelto              -antiplatelet therapy: N/A 3. Pain Management: oxycodone  and robaxin  prn. 4. Mood/Behavior/Sleep: LCSW to follow for evaluation and support.              -antipsychotic agents: N/A 5. Neuropsych/cognition: This patient is capable of making decisions on his own behalf. 6. Skin/Wound Care: Routine pressure relief measures. --Surgical dressings intact on both knees. 7. Fluids/Electrolytes/Nutrition: Monitor I/O. Check CMET in am  8. 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 Addie 9. BP/Orthostatic hypotension: BP improving. Continue to monitor.  10.  Hyponatremia/Hypochloremia: Improving with improvement in intake? -9/4 hyponatremia improved to 134, potassium stable at 4.5 11. ABLA: Recheck CBC in am. HGB 11.5 on 8/29  - 9/3 Hemoglobin down to 7.3, recheck today, FOBT 12.  Low grade fevers: T-max 100.3 yesterday evening.  --Continue to monitor for signs of infection. CXR and UA neg --BC X 2  08/30 -->negative/pending.  -9/3 WBC a little improved to 12.7 -9/4 recheck chest x-ray, lactic acid, procalcitonin, urinalysis, blood cultures 13. Constipation: Up all night after mag citrate.    -Continue Miralax  decrease to daily, senokot  -Dulcolax supp  PRN  -9/4 LBM today a little loose, decrease Senokot from twice daily to at bedtime 14. Leucocytosis: Monitor WBC as rising from 4.7-->14.4             --monitor for fevers and other signs of infection.   - 9/4 WBC 12.7,  little improved 15. Impaired fasting glucose:  Check AIc in am.         LOS: 1 days  A FACE TO FACE EVALUATION WAS PERFORMED  Roger Pace 04/23/2024, 4:04 PM

## 2024-04-24 DIAGNOSIS — I2699 Other pulmonary embolism without acute cor pulmonale: Secondary | ICD-10-CM | POA: Diagnosis not present

## 2024-04-24 DIAGNOSIS — R7401 Elevation of levels of liver transaminase levels: Secondary | ICD-10-CM

## 2024-04-24 DIAGNOSIS — I951 Orthostatic hypotension: Secondary | ICD-10-CM

## 2024-04-24 DIAGNOSIS — M170 Bilateral primary osteoarthritis of knee: Secondary | ICD-10-CM | POA: Diagnosis not present

## 2024-04-24 DIAGNOSIS — K5900 Constipation, unspecified: Secondary | ICD-10-CM | POA: Diagnosis not present

## 2024-04-24 DIAGNOSIS — D62 Acute posthemorrhagic anemia: Secondary | ICD-10-CM | POA: Diagnosis not present

## 2024-04-24 LAB — CBC WITH DIFFERENTIAL/PLATELET
Abs Immature Granulocytes: 0.58 K/uL — ABNORMAL HIGH (ref 0.00–0.07)
Basophils Absolute: 0.1 K/uL (ref 0.0–0.1)
Basophils Relative: 1 %
Eosinophils Absolute: 0.1 K/uL (ref 0.0–0.5)
Eosinophils Relative: 0 %
HCT: 23.6 % — ABNORMAL LOW (ref 39.0–52.0)
Hemoglobin: 7.8 g/dL — ABNORMAL LOW (ref 13.0–17.0)
Immature Granulocytes: 4 %
Lymphocytes Relative: 21 %
Lymphs Abs: 3.1 K/uL (ref 0.7–4.0)
MCH: 31.2 pg (ref 26.0–34.0)
MCHC: 33.1 g/dL (ref 30.0–36.0)
MCV: 94.4 fL (ref 80.0–100.0)
Monocytes Absolute: 1.1 K/uL — ABNORMAL HIGH (ref 0.1–1.0)
Monocytes Relative: 8 %
Neutro Abs: 9.8 K/uL — ABNORMAL HIGH (ref 1.7–7.7)
Neutrophils Relative %: 66 %
Platelets: 604 K/uL — ABNORMAL HIGH (ref 150–400)
RBC: 2.5 MIL/uL — ABNORMAL LOW (ref 4.22–5.81)
RDW: 14.7 % (ref 11.5–15.5)
WBC: 14.7 K/uL — ABNORMAL HIGH (ref 4.0–10.5)
nRBC: 0.8 % — ABNORMAL HIGH (ref 0.0–0.2)

## 2024-04-24 LAB — OCCULT BLOOD X 1 CARD TO LAB, STOOL: Fecal Occult Bld: NEGATIVE

## 2024-04-24 NOTE — Progress Notes (Addendum)
 PROGRESS NOTE   Subjective/Complaints: Patient had workup for low-grade fever yesterday.  Reports he felt okay overall but felt a little tired at the time.  Did have elevated lactic acid initially but came down on repeat, had IV fluids ordered.  Pain under control.  Heart rate and respiratory rate stable this morning.  Positive orthostatics with drop in BP to 82/33 when standing.  Noted to have decreased hemoglobin yesterday at 7.3, recheck this morning 7.8.  ROS: Patient denies chills, new vision changes, dizziness, nausea, vomiting, diarrhea,  shortness of breath or chest pain, headache, or mood change. + fever yesterday low-grade  Objective:   DG Chest 2 View Result Date: 04/23/2024 CLINICAL DATA:  Fever. EXAM: CHEST - 2 VIEW COMPARISON:  Chest radiograph dated 04/16/2024. CTA chest dated 04/20/2024. FINDINGS: The heart size and mediastinal contours are unchanged. No focal consolidation, pleural effusion, or pneumothorax. No acute osseous abnormality. IMPRESSION: No acute cardiopulmonary findings. Electronically Signed   By: Harrietta Sherry M.D.   On: 04/23/2024 19:00   Recent Labs    04/23/24 1603 04/24/24 0531  WBC 14.1* 14.7*  HGB 7.2* 7.8*  HCT 22.0* 23.6*  PLT 558* 604*   Recent Labs    04/22/24 0334 04/23/24 0541  NA 131* 134*  K 4.3 4.5  CL 96* 99  CO2 25 20*  GLUCOSE 115* 122*  BUN 15 15  CREATININE 0.99 1.06  CALCIUM  8.2* 8.5*    Intake/Output Summary (Last 24 hours) at 04/24/2024 1251 Last data filed at 04/24/2024 9176 Gross per 24 hour  Intake 804 ml  Output 1300 ml  Net -496 ml        Physical Exam: Vital Signs Blood pressure 126/66, pulse 96, temperature 98.8 F (37.1 C), temperature source Oral, resp. rate 18, height 5' 11.5 (1.816 m), weight 118 kg, SpO2 100%.  General: No apparent distress, sitting in wheelchair appears comfortable HEENT: Head is normocephalic, atraumatic, sclera anicteric,  oral mucosa pink and moist Neck: Supple without JVD or lymphadenopathy Heart: Reg rate and rhythm. No murmurs rubs or gallops Chest: CTA bilaterally without wheezes, non labored Abdomen: Soft, non-tender, mildly-distended, bowel sounds positive. Extremities: Mild LE edema, b/l TKA incisions with dressing CDI Psych: Pt's affect is appropriate. Pt is cooperative Skin: Clean and intact without signs of breakdown Neuro:     Mental Status: AAOx3, good insight and awareness Speech/Languate: Follows commands CRANIAL NERVES: 2-12 grossly intact   MOTOR: RUE: 5/5 Deltoid, 5/5 Biceps, 5/5 Triceps,5/5 Grip LUE: 5/5 Deltoid, 5/5 Biceps, 5/5 Triceps, 5/5 Grip RLE: HF 2/5, KE 2/5, ADF 4/5, APF 4/5 LLE: HF 2/5, KE 2/5, ADF 4/5, APF 4/5   SENSORY: Normal to touch all 4 extremities  MSK: Right knee immobilizer on left leg  CPM machine in room   Prior neuro assessment is c/w today's exam 04/24/2024.   Assessment/Plan: 1. Functional deficits which require 3+ hours per day of interdisciplinary therapy in a comprehensive inpatient rehab setting. Physiatrist is providing close team supervision and 24 hour management of active medical problems listed below. Physiatrist and rehab team continue to assess barriers to discharge/monitor patient progress toward functional and medical goals  Care Tool:  Bathing  Body parts bathed by patient: Right arm, Left arm, Chest, Abdomen, Front perineal area, Right upper leg, Left upper leg   Body parts bathed by helper: Left lower leg, Right lower leg, Buttocks     Bathing assist Assist Level: Maximal Assistance - Patient 24 - 49%     Upper Body Dressing/Undressing Upper body dressing        Upper body assist Assist Level: Set up assist    Lower Body Dressing/Undressing Lower body dressing            Lower body assist Assist for lower body dressing: Maximal Assistance - Patient 25 - 49%     Toileting Toileting    Toileting assist Assist for  toileting: Maximal Assistance - Patient 25 - 49%     Transfers Chair/bed transfer  Transfers assist     Chair/bed transfer assist level: Moderate Assistance - Patient 50 - 74%     Locomotion Ambulation   Ambulation assist      Assist level: Minimal Assistance - Patient > 75% Assistive device: Walker-rolling Max distance: 40   Walk 10 feet activity   Assist     Assist level: Minimal Assistance - Patient > 75% Assistive device: Walker-rolling   Walk 50 feet activity   Assist Walk 50 feet with 2 turns activity did not occur: Safety/medical concerns (fatigue/pain)         Walk 150 feet activity   Assist Walk 150 feet activity did not occur: Safety/medical concerns         Walk 10 feet on uneven surface  activity   Assist Walk 10 feet on uneven surfaces activity did not occur: Safety/medical concerns         Wheelchair     Assist Is the patient using a wheelchair?: Yes Type of Wheelchair: Manual    Wheelchair assist level: Supervision/Verbal cueing Max wheelchair distance: 150+    Wheelchair 50 feet with 2 turns activity    Assist        Assist Level: Supervision/Verbal cueing   Wheelchair 150 feet activity     Assist      Assist Level: Supervision/Verbal cueing   Blood pressure 126/66, pulse 96, temperature 98.8 F (37.1 C), temperature source Oral, resp. rate 18, height 5' 11.5 (1.816 m), weight 118 kg, SpO2 100%.  Medical Problem List and Plan: 1. Functional deficits secondary to B/L knee OA s/p b/l TKA complicated by acute PE             -patient may shower, cover incisions please             -ELOS/Goals: 12-15 days, PT/OT Mod I             - Continue CIR  -Discussed with Dr. Addie, appreciate assistance, pt to alternate knee immobilizers between legs, he can DC this when he can do 10 straight leg raises with each leg   - Adjusted CPM order 2.  Acute DVT/PE/Antithrombotics: -DVT/anticoagulation:  Pharmaceutical:  Xarelto              -antiplatelet therapy: N/A 3. Pain Management: oxycodone  and robaxin  prn. 4. Mood/Behavior/Sleep: LCSW to follow for evaluation and support.              -antipsychotic agents: N/A 5. Neuropsych/cognition: This patient is capable of making decisions on his own behalf. 6. Skin/Wound Care: Routine pressure relief measures. --Surgical dressings intact on both knees. 7. Fluids/Electrolytes/Nutrition: Monitor I/O. Check CMET in am  8. OA bilateral knees s/p B-TKR  08/26 per Dr. Addie 9. BP/Orthostatic hypotension: BP improving. Continue to monitor.  10.  Hyponatremia/Hypochloremia: Improving with improvement in intake? -9/4 hyponatremia improved to 134, potassium stable at 4.5 11. ABLA: Recheck CBC in am. HGB 11.5 on 8/29  - 9/3 Hemoglobin down to 7.3, recheck today, FOBT.  Will recheck tomorrow to ensure it stable  -9/5 FOBT negative, hemoglobin a little improved.  Continue to monitor daily for now.  Xarelto  continued for now 12.  Low grade fevers: T-max 100.3 yesterday evening.  --Continue to monitor for signs of infection. CXR and UA neg --BC X 2  08/30 -->negative/pending.  -9/3 WBC a little improved to 12.7 -9/4 recheck chest x-ray, lactic acid, procalcitonin, urinalysis, blood cultures -9/5 lactic acid 2.2 initially yesterday but improved to 1.6, had fluid bolus.  Blood cultures with no growth, urine does not indicate infection, chest x-ray without acute changes 13. Constipation: Up all night after mag citrate.    -Continue Miralax  decrease to daily, senokot  -Dulcolax supp  PRN  -9/4 LBM today a little loose, decrease Senokot from twice daily to at bedtime -9/5 LBM today 14. Leucocytosis: Monitor WBC as rising from 4.7-->14.4             --monitor for fevers and other signs of infection.   - 9/5 WBC still elevated at 14.7 15. Impaired fasting glucose:  Check AIc in am. A1C 4.9 16. Orthostatic hypotension  - Encourage fluid intake, order abdominal binder       04/24/2024    5:48 AM 04/24/2024    4:30 AM 04/24/2024   12:06 AM  Vitals with BMI  Systolic 126 134 875  Diastolic 66 66 68  Pulse 96 98 93   17.  Transaminitis.  Has history of chronic mild elevation.  Continue to monitor        LOS: 2 days A FACE TO FACE EVALUATION WAS PERFORMED  Murray Collier 04/24/2024, 12:51 PM

## 2024-04-24 NOTE — IPOC Note (Signed)
 Overall Plan of Care [facility]) Patient Details Name: Roger Pace MRN: [id number] DOB: [birth year]  Admitting Diagnosis: OA (osteoarthritis) of knee  Hospital Problems: Principal Problem:   OA (osteoarthritis) of knee Active Problems:   Hyponatremia   ABLA (acute blood loss anemia)   Leukocytosis   Constipation   Acute pulmonary embolism (HCC)     Functional Problem List: Nursing Bladder, Bowel, Edema, Endurance, Medication Management, Motor, Pain, Safety, Skin Integrity  PT Balance, Edema, Endurance, Pain, Safety, Skin Integrity  OT Balance, Endurance, Motor, Pain  SLP    TR         Basic ADL's: OT Grooming, Dressing, Toileting, Bathing     Advanced  ADL's: OT       Transfers: PT Bed Mobility, Bed to Chair, Customer service manager, Tub/Shower     Locomotion: PT Ambulation, Wheelchair Mobility     Additional Impairments: OT None  SLP        TR      Anticipated Outcomes Item Anticipated Outcome  Self Feeding mod I  Swallowing      Basic self-care  SUP  Toileting  SUP   Bathroom Transfers SUP  Bowel/Bladder  manage bowels with medications/ managbe bladder with toileting assistance  Transfers  supervision  Locomotion  supervision  Communication     Cognition     Pain  <4 w/ prns  Safety/Judgment  manage safety with mod I assistance   Therapy Plan: PT Intensity: Minimum of 1-2 x/day ,45 to 90 minutes PT Frequency: 5 out of 7 days PT Duration Estimated Length of Stay: 12-15 days OT Intensity: Minimum of 1-2 x/day, 45 to 90 minutes OT Frequency: 5 out of 7 days OT Duration/Estimated Length of Stay: 10-12 days     Team Interventions: Nursing Interventions Patient/Family Education, Disease Management/Prevention, Pain Management, Discharge Planning, Skin Care/Wound Management  PT interventions Ambulation/gait training, Disease management/prevention, Pain management, Stair training, Visual/perceptual remediation/compensation, Designer, jewellery, DME/adaptive equipment instruction, Therapeutic Activities, Patient/family education, Wheelchair propulsion/positioning, Cognitive remediation/compensation, Functional electrical stimulation, Psychosocial support, Therapeutic Exercise, Community reintegration, Functional mobility training, Skin care/wound management, UE/LE Strength taining/ROM, Discharge planning, Neuromuscular re-education, Splinting/orthotics, UE/LE Coordination activities  OT Interventions Therapeutic Activities, Functional mobility training, Psychosocial support, Discharge planning, UE/LE Coordination activities, Warden/ranger, Self Care/advanced ADL retraining, Therapeutic Exercise, DME/adaptive equipment instruction, UE/LE Strength taining/ROM, Patient/family education, Community reintegration, Pain management, Disease mangement/prevention  SLP Interventions    TR Interventions    SW/CM Interventions Discharge Planning, Psychosocial Support, Patient/Family Education, Disease Management/Prevention   Barriers to Discharge MD  Medical stability  Nursing Decreased caregiver support, Home environment access/layout, Weight bearing restrictions Discharge: Apartment  Discharge Home Layout: One level  Discharge Home Access: Elevator, Level entry  PT Wound Care, Weight pain  OT Weight bearing restrictions wBAT,increased pain  SLP      SW       Team Discharge Planning: Destination: PT-Home ,OT- Home , SLP-  Projected Follow-up: PT-Home health PT, Outpatient PT, OT-  None, SLP-  Projected Equipment Needs: PT-To be determined, OT- 3 in 1 bedside comode, Rolling walker with 5 wheels, Tub/shower bench, SLP-  Equipment Details: PT- , OT-has 2 cpm Patient/family involved in discharge planning: PT- Patient, Family member/caregiver,  OT-Family Adult nurse, Patient, SLP-   MD ELOS: 10-14 Medical Rehab Prognosis:  Excellent Assessment: The patient has been admitted for CIR therapies with the diagnosis of  B/L knee OA s/p b/l TKA . The team will be addressing functional mobility, strength, stamina, balance, safety, adaptive techniques and equipment,  self-care, bowel and bladder mgt, patient and caregiver education. Goals have been set at sup. Anticipated discharge destination is home.        See Team Conference Notes for weekly updates to the plan of care

## 2024-04-24 NOTE — Progress Notes (Signed)
 Occupational Therapy Session Note  Patient Details  Name: Roger Pace MRN: [id number] Date of Birth: 07-22-1971   Session 1: Today's Date: 04/24/2024 OT Individual Time: 9153-9069 OT Individual Time Calculation (min): 44 min   Session 2: Today's Date: 04/24/2024 OT Individual Time: 1300-1401 OT Individual Time Calculation (min): 61 min   Short Term Goals: Week 1:  OT Short Term Goal 1 (Week 1): patient will complete sit to stand for LB ADLs with mod A OT Short Term Goal 2 (Week 1): patient will complete toileting 3/3 with mod A OT Short Term Goal 3 (Week 1): patient will demo ability to complete toilet transfers with mod A OT Short Term Goal 4 (Week 1): patient will demo ability to complete LB bathing with mod A  Session 1 Skilled Therapeutic Interventions/Progress Updates:  Patient agreeable to participate in OT session. Reports 3/10 pain level, premedicated just prior to session. .   Patient participated in skilled OT session focusing on ADL completeion. Patient with nursing upon OT arrival for medication. Patient completed bathing in supine due to pain and mobility. Patient completed UB bathing SUPA, LB bathing mod A with rolling. Patient completed UB dressing SU, LB dressing mod to max A. Patient footwear for ted hose dep. Patient completed sit to stand from elevated bed for wc transfer with min A. Patient able to complete standing portion of transfer with min A with RW. Patient left in room with all needs in reach, spouse in room.   Session 2 Skilled Therapeutic Interventions/Progress Updates:  Patient agreeable to participate in OT session. Reports 7/10 pain level.   Patient participated in skilled OT session focusing on LE strengthening, standing tolerance, standing balance. Patient able to complete standing in parallel bars with weight shifts with increase ability to remain upright. Patient completed abductor and adductor closed chain exercises 2x10 for increased LE strength  related to functional mobility for ADL participation. Patient completed heel slides to increase LE ROM to increase sit to stands, functional mobility, ability to complete LB dressing, and overall function. Completed transfer to bed and bed mobility with max A for LB management in and out of bed due to pain .    Therapy Documentation Precautions:  Precautions Precautions: Fall, Knee Recall of Precautions/Restrictions: Intact Precaution/Restrictions Comments: Pt educated on need for knee extension at this time. Bed locked in extension, and education provided that NO pillow/roll/ice pack should be placed under the knee. Required Braces or Orthoses: Knee Immobilizer - Left Knee Immobilizer - Right: Other (comment) (Dr. Addie, try to alternate knee immobilizer between legs to allow each leg the chance to bear more of the load when mobilizing.) Knee Immobilizer - Left: Other (comment) (Dr. Addie, try to alternate knee immobilizer between legs to allow each leg the chance to bear more of the load when mobilizing.) Restrictions Weight Bearing Restrictions Per Provider Order: No RLE Weight Bearing Per Provider Order: Weight bearing as tolerated LLE Weight Bearing Per Provider Order: Weight bearing as tolerated   Therapy/Group: Individual Therapy  Roger Pace 04/24/2024, 7:25 AM

## 2024-04-24 NOTE — Progress Notes (Signed)
 Orthopedic Tech Progress Note Patient Details:  Roger Pace May 19, 1971 [id number]  CPM Left Knee CPM Left Knee: On Left Knee Flexion (Degrees): 40 Left Knee Extension (Degrees): 0 Additional Comments: Pt has been alternating cpm left to right himself after this technician gets him started. CPM Right Knee CPM Right Knee: Off (off per patient request.) Right Knee Flexion (Degrees): 40 Right Knee Extension (Degrees): 10  Post Interventions Patient Tolerated: Well  Roger Pace 04/24/2024, 5:53 PM

## 2024-04-24 NOTE — Progress Notes (Signed)
 Physical Therapy Session Note  Patient Details  Name: Roger Pace MRN: [id number] Date of Birth: [birth year]  Today's Date: 04/24/2024 PT Individual Time: 1009-1108 PT Individual Time Calculation (min): 59 min   Short Term Goals: Week 1:  PT Short Term Goal 1 (Week 1): pt will perform sit to stand with RW and min A with LRAD PT Short Term Goal 2 (Week 1): pt will ambulate 100 feet with LRAD and min A PT Short Term Goal 3 (Week 1): pt will demonstrate ability to actively extend [name] to -5 deg or less to reduce dependence on knee immobilizer  Skilled Therapeutic Interventions/Progress Updates:    Pt presents in room in [facility], agreeable to PT. Pt denies pain at start of session. Session focused on gait training for upright tolerance, therapeutic activities for transfer training, activity tolerance, and vitals management, and therapeutic exercise for BLE strengthening and ROM. Vitals assessed throughout session, noted below. Pt self propels WC with BUEs from room to main gym ~200' with supervision, completed as warm up prior to upright mobility. Pt completes stands from [facility] with mod assist for maintaining forward weightshift with transitioning BUEs to RW secondary to poor BLE knee flexion. Pt then ambulates 12' and 51' with RW CGA/min assist, knee immobilizer on LLE initially, switched to RLE for 2nd ambulation trial. Pt provided with extended seated rest break between gait trials, HR remains elevated at 110bpm even with rest. Pt then completes sit to supine with mod assist for BLEs onto bed, participates with therapeutic exercise for BLE strengthening and ROM including: - SAQs x10 BLE (AAROM for full ROM bilaterally, cues for eccentric lowering) - heel slides x10 BLE (AAROM and cues for increasing ROM) Pt remains supine on mat at end of session to await OT session, OT notified of pt positioning at end of session.  Vitals: Seated prior to ambulation: BP 133/72, HR 99 Following first ambulation  trials: 115/64, HR 115   Therapy Documentation Precautions:  Precautions Precautions: Fall, Knee Recall of Precautions/Restrictions: Intact Precaution/Restrictions Comments: Pt educated on need for knee extension at this time. Bed locked in extension, and education provided that NO pillow/roll/ice pack should be placed under the knee. Required Braces or Orthoses: Knee Immobilizer - Left Knee Immobilizer - Right: Other (comment) (Dr. Addie, try to alternate knee immobilizer between legs to allow each leg the chance to bear more of the load when mobilizing.) Knee Immobilizer - Left: Other (comment) (Dr. Addie, try to alternate knee immobilizer between legs to allow each leg the chance to bear more of the load when mobilizing.) Restrictions Weight Bearing Restrictions Per Provider Order: No RLE Weight Bearing Per Provider Order: Weight bearing as tolerated LLE Weight Bearing Per Provider Order: Weight bearing as tolerated     Therapy/Group: Individual Therapy  Reche Ohara PT, DPT 04/24/2024, 11:40 AM

## 2024-04-24 NOTE — Progress Notes (Signed)
 Occupational Therapy Session Note  Patient Details  Name: Roger Pace MRN: [id number] Date of Birth: [birth year]  Today's Date: 04/24/2024 OT Individual Time: 1115-1200 OT Individual Time Calculation (min): 45 min    Short Term Goals: Week 1:  OT Short Term Goal 1 (Week 1): patient will complete sit to stand for LB ADLs with mod A OT Short Term Goal 2 (Week 1): patient will complete toileting 3/3 with mod A OT Short Term Goal 3 (Week 1): patient will demo ability to complete toilet transfers with mod A OT Short Term Goal 4 (Week 1): patient will demo ability to complete LB bathing with mod A  Skilled Therapeutic Interventions/Progress Updates:    Skilled OT intervention with focus on sit<>stand and standing balance to increase independence with BADLs. Sit<>stand from elevated surface with min A x 4. Standing balance at table to place and remove [place] from table with min A. No LOB noted. Stand step transfer to w/c with min A. Stand>sit with min A. Pt recalled compensatory strategies. Pt remained in w/c with all needs within reach. Wife present in room.   Therapy Documentation Precautions:  Precautions Precautions: Fall, Knee Recall of Precautions/Restrictions: Intact Precaution/Restrictions Comments: Pt educated on need for knee extension at this time. Bed locked in extension, and education provided that NO pillow/roll/ice pack should be placed under the knee. Required Braces or Orthoses: Knee Immobilizer - Left Knee Immobilizer - Right: Other (comment) (Dr. Addie, try to alternate knee immobilizer between legs to allow each leg the chance to bear more of the load when mobilizing.) Knee Immobilizer - Left: Other (comment) (Dr. Addie, try to alternate knee immobilizer between legs to allow each leg the chance to bear more of the load when mobilizing.) Restrictions Weight Bearing Restrictions Per Provider Order: Yes RLE Weight Bearing Per Provider Order: Weight bearing as  tolerated LLE Weight Bearing Per Provider Order: Weight bearing as tolerated   Pain: Pt reported pain of 6-8 depending on demand on BLE-6 at rest escalating to 8 when sit<>stand/standing; rest breaks provided throughout.  Therapy/Group: Individual Therapy  Roger Pace 04/24/2024, 3:00 PM

## 2024-04-25 DIAGNOSIS — Z96653 Presence of artificial knee joint, bilateral: Secondary | ICD-10-CM

## 2024-04-25 DIAGNOSIS — D72823 Leukemoid reaction: Secondary | ICD-10-CM | POA: Diagnosis not present

## 2024-04-25 DIAGNOSIS — M170 Bilateral primary osteoarthritis of knee: Secondary | ICD-10-CM | POA: Diagnosis not present

## 2024-04-25 DIAGNOSIS — D62 Acute posthemorrhagic anemia: Secondary | ICD-10-CM | POA: Diagnosis not present

## 2024-04-25 LAB — CBC
HCT: 23.1 % — ABNORMAL LOW (ref 39.0–52.0)
Hemoglobin: 7.5 g/dL — ABNORMAL LOW (ref 13.0–17.0)
MCH: 31 pg (ref 26.0–34.0)
MCHC: 32.5 g/dL (ref 30.0–36.0)
MCV: 95.5 fL (ref 80.0–100.0)
Platelets: 636 K/uL — ABNORMAL HIGH (ref 150–400)
RBC: 2.42 MIL/uL — ABNORMAL LOW (ref 4.22–5.81)
RDW: 14.7 % (ref 11.5–15.5)
WBC: 14 K/uL — ABNORMAL HIGH (ref 4.0–10.5)
nRBC: 0.9 % — ABNORMAL HIGH (ref 0.0–0.2)

## 2024-04-25 LAB — BASIC METABOLIC PANEL WITH GFR
Anion gap: 13 (ref 5–15)
BUN: 15 mg/dL (ref 6–20)
CO2: 20 mmol/L — ABNORMAL LOW (ref 22–32)
Calcium: 8.7 mg/dL — ABNORMAL LOW (ref 8.9–10.3)
Chloride: 101 mmol/L (ref 98–111)
Creatinine, Ser: 1.14 mg/dL (ref 0.61–1.24)
GFR, Estimated: >60 mL/min (ref >60)
Glucose, Bld: 115 mg/dL — ABNORMAL HIGH (ref 70–99)
Potassium: 4.3 mmol/L (ref 3.5–5.1)
Sodium: 134 mmol/L — ABNORMAL LOW (ref 135–145)

## 2024-04-25 NOTE — Plan of Care (Signed)
  Problem: Consults Goal: RH GENERAL PATIENT EDUCATION Description: See Patient Education module for education specifics. Outcome: Progressing   Problem: RH SKIN INTEGRITY Goal: RH STG SKIN FREE OF INFECTION/BREAKDOWN Description: Manage skin free of infection / breakdown with mod I assistance Outcome: Progressing   Problem: RH SAFETY Goal: RH STG ADHERE TO SAFETY PRECAUTIONS W/ASSISTANCE/DEVICE Description: STG Adhere to Safety Precautions With mod I  Assistance/Device. Outcome: Progressing   Problem: RH PAIN MANAGEMENT Goal: RH STG PAIN MANAGED AT OR BELOW PT'S PAIN GOAL Description: <4 w/ prns Outcome: Progressing

## 2024-04-25 NOTE — Progress Notes (Signed)
 PROGRESS NOTE   Subjective/Complaints: Pt feeling well. Slept most of the night. Pain being controlled. Still with low grade temp  ROS: Patient denies fever, rash, sore throat, blurred vision, dizziness, nausea, vomiting, diarrhea, cough, shortness of breath or chest pain, joint or back/neck pain, headache, or mood change.   Objective:   DG Chest 2 View Result Date: 04/23/2024 CLINICAL DATA:  Fever. EXAM: CHEST - 2 VIEW COMPARISON:  Chest radiograph dated 04/16/2024. CTA chest dated 04/20/2024. FINDINGS: The heart size and mediastinal contours are unchanged. No focal consolidation, pleural effusion, or pneumothorax. No acute osseous abnormality. IMPRESSION: No acute cardiopulmonary findings. Electronically Signed   By: Harrietta Sherry M.D.   On: 04/23/2024 19:00   Recent Labs    04/24/24 0531 04/25/24 0433  WBC 14.7* 14.0*  HGB 7.8* 7.5*  HCT 23.6* 23.1*  PLT 604* 636*   Recent Labs    04/23/24 0541 04/25/24 0433  NA 134* 134*  K 4.5 4.3  CL 99 101  CO2 20* 20*  GLUCOSE 122* 115*  BUN 15 15  CREATININE 1.06 1.14  CALCIUM  8.5* 8.7*    Intake/Output Summary (Last 24 hours) at 04/25/2024 0904 Last data filed at 04/25/2024 0656 Gross per 24 hour  Intake 474 ml  Output 2000 ml  Net -1526 ml        Physical Exam: Vital Signs Blood pressure 111/63, pulse 92, temperature 99.2 F (37.3 C), resp. rate 19, height 5' 11.5 (1.816 m), weight 118 kg, SpO2 97%.  Constitutional: No distress . Vital signs reviewed. HEENT: NCAT, EOMI, oral membranes moist Neck: supple Cardiovascular: RRR without murmur. No JVD    Respiratory/Chest: CTA Bilaterally without wheezes or rales. Normal effort    GI/Abdomen: BS +, non-tender, non-distended Ext: no clubbing, cyanosis, or edema Psych: pleasant and cooperative  Skin: Clean and intact without signs of breakdown. Knee incisions appear intact with post-op dressings in place. Neuro:      Mental Status: AAOx3, good insight and awareness Speech/Languate: Follows commands CRANIAL NERVES: 2-12 grossly intact   MOTOR: RUE: 5/5 Deltoid, 5/5 Biceps, 5/5 Triceps,5/5 Grip LUE: 5/5 Deltoid, 5/5 Biceps, 5/5 Triceps, 5/5 Grip RLE: HF 2/5, KE 2/5, ADF 4/5, APF 4/5 LLE: HF 2/5, KE 2/5, ADF 4/5, APF 4/5   SENSORY: Normal to touch all 4 extremities  MSK: Right knee immobilizer on left leg  CPM machine in room   Prior neuro assessment is c/w today's exam 04/25/2024.   Assessment/Plan: 1. Functional deficits which require 3+ hours per day of interdisciplinary therapy in a comprehensive inpatient rehab setting. Physiatrist is providing close team supervision and 24 hour management of active medical problems listed below. Physiatrist and rehab team continue to assess barriers to discharge/monitor patient progress toward functional and medical goals  Care Tool:  Bathing    Body parts bathed by patient: Right arm, Left arm, Chest, Abdomen, Front perineal area, Right upper leg, Left upper leg   Body parts bathed by helper: Left lower leg, Right lower leg, Buttocks     Bathing assist Assist Level: Maximal Assistance - Patient 24 - 49%     Upper Body Dressing/Undressing Upper body dressing  Upper body assist Assist Level: Set up assist    Lower Body Dressing/Undressing Lower body dressing            Lower body assist Assist for lower body dressing: Maximal Assistance - Patient 25 - 49%     Toileting Toileting    Toileting assist Assist for toileting: Maximal Assistance - Patient 25 - 49%     Transfers Chair/bed transfer  Transfers assist     Chair/bed transfer assist level: Minimal Assistance - Patient > 75%     Locomotion Ambulation   Ambulation assist      Assist level: Minimal Assistance - Patient > 75% Assistive device: Walker-rolling Max distance: 34ft   Walk 10 feet activity   Assist     Assist level: Minimal Assistance -  Patient > 75% Assistive device: Walker-rolling   Walk 50 feet activity   Assist Walk 50 feet with 2 turns activity did not occur: Safety/medical concerns (fatigue/pain)  Assist level: Minimal Assistance - Patient > 75% Assistive device: Walker-rolling    Walk 150 feet activity   Assist Walk 150 feet activity did not occur: Safety/medical concerns         Walk 10 feet on uneven surface  activity   Assist Walk 10 feet on uneven surfaces activity did not occur: Safety/medical concerns         Wheelchair     Assist Is the patient using a wheelchair?: Yes Type of Wheelchair: Manual    Wheelchair assist level: Supervision/Verbal cueing Max wheelchair distance: 150+    Wheelchair 50 feet with 2 turns activity    Assist        Assist Level: Supervision/Verbal cueing   Wheelchair 150 feet activity     Assist      Assist Level: Supervision/Verbal cueing   Blood pressure 111/63, pulse 92, temperature 99.2 F (37.3 C), resp. rate 19, height 5' 11.5 (1.816 m), weight 118 kg, SpO2 97%.  Medical Problem List and Plan: 1. Functional deficits secondary to B/L knee OA s/p b/l TKA complicated by acute PE             -patient may shower, cover incisions please             -ELOS/Goals: 12-15 days, PT/OT Mod I             -Discussed with Dr. Addie, appreciate assistance, pt to alternate knee immobilizers between legs, he can DC this when he can do 10 straight leg raises with each leg   -continue CPM  -Continue CIR therapies including PT, OT  2.  Acute DVT/PE/Antithrombotics: -DVT/anticoagulation:  Pharmaceutical: Xarelto              -antiplatelet therapy: N/A 3. Pain Management: oxycodone  and robaxin  prn.  -appears controlled 4. Mood/Behavior/Sleep: LCSW to follow for evaluation and support.              -antipsychotic agents: N/A 5. Neuropsych/cognition: This patient is capable of making decisions on his own behalf. 6. Skin/Wound Care: Routine pressure  relief measures. --Surgical dressings intact on both knees. 7. Fluids/Electrolytes/Nutrition: Monitor I/O. Check CMET in am  8. 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 Addie 9. BP/Orthostatic hypotension: BP improving. Continue to monitor.  10.  Hyponatremia/Hypochloremia: Improving with improvement in intake? -9/4 hyponatremia improved to 134, potassium stable at 4.5 11. ABLA: Recheck CBC in am. HGB 11.5 on 8/29  - 9/3 Hemoglobin down to 7.3, recheck today, FOBT.  Will recheck tomorrow to ensure it stable  -  9/5 FOBT negative, hemoglobin a little improved.  Continue to monitor daily for now.  Xarelto  continued for now  9/6 slight dop today but with range of variability. No signs of blood loss on exam.  12.  Low grade fevers: T-max 100.3 yesterday evening.  --Continue to monitor for signs of infection. CXR and UA neg --BC X 2  08/30 -->negative/pending.  -9/3 WBC a little improved to 12.7 -9/4 recheck chest x-ray, lactic acid, procalcitonin, urinalysis, blood cultures -9/5 lactic acid 2.2 initially yesterday but improved to 1.6, had fluid bolus.  Blood cultures with no growth, urine does not indicate infection, chest x-ray without acute changes 9/6 WBC's still 14k. UA negative. Wounds look ok. CXR NAD  -BCx pending, no growth so far   -recheck labs tomorrow   -monitor only for now 13. Constipation: Up all night after mag citrate.    -Continue Miralax  decrease to daily, senokot  -Dulcolax supp  PRN  -9/4 LBM today a little loose, decrease Senokot from twice daily to at bedtime -9/5 LBM  15. Impaired fasting glucose:  Check AIc in am. A1C 4.9 16. Orthostatic hypotension  - Encourage fluid intake, order abdominal binder      04/25/2024    4:52 AM 04/24/2024    8:04 PM 04/24/2024    4:00 PM  Vitals with BMI  Systolic 111 124 868  Diastolic 63 56 68  Pulse 92 100 96   17.  Transaminitis.  Has history of chronic mild elevation.  Continue to monitor        LOS: 3 days A FACE TO FACE  EVALUATION WAS PERFORMED  Arthea ONEIDA Gunther 04/25/2024, 9:04 AM

## 2024-04-25 NOTE — Progress Notes (Signed)
 Occupational Therapy Session Note  Patient Details  Name: Roger Pace MRN: [id number] Date of Birth: 1971-06-22  Today's Date: 04/25/2024 OT Individual Time: 8651-8497 OT Individual Time Calculation (min): 74 min    Short Term Goals: Week 1:  OT Short Term Goal 1 (Week 1): patient will complete sit to stand for LB ADLs with mod A OT Short Term Goal 2 (Week 1): patient will complete toileting 3/3 with mod A OT Short Term Goal 3 (Week 1): patient will demo ability to complete toilet transfers with mod A OT Short Term Goal 4 (Week 1): patient will demo ability to complete LB bathing with mod A  Skilled Therapeutic Interventions/Progress Updates:  Patient agreeable to participate in OT session. No reported  pain level, premedicated.   Patient participated in skilled OT session focusing on LE strengthening, functional mobility for ADLs, LE ROM to increase participation in ADLs. Patient began session with bed level LE exercises to increase ability to complete ADLs. Patient completed short arc quadriceps exercises, and SLR to increase LE strength related to LE dressing, toileting, and toileting transfers. Completed lateral transfer to wc. Completed ROM exercises and wc push ups with LE + UE assist for quad initiation. Completed Nustep for  increased ROM to increase patients functional abilities. Patient returned to bed via lateral transfer, sit to supine SUP. All needs in reach.   Therapy Documentation Precautions:  Precautions Precautions: Fall, Knee Recall of Precautions/Restrictions: Intact Precaution/Restrictions Comments: Pt educated on need for knee extension at this time. Bed locked in extension, and education provided that NO pillow/roll/ice pack should be placed under the knee. Required Braces or Orthoses: Knee Immobilizer - Left Knee Immobilizer - Right: Other (comment) (Dr. Addie, try to alternate knee immobilizer between legs to allow each leg the chance to bear more of the load  when mobilizing.) Knee Immobilizer - Left: Other (comment) (Dr. Addie, try to alternate knee immobilizer between legs to allow each leg the chance to bear more of the load when mobilizing.) Restrictions Weight Bearing Restrictions Per Provider Order: Yes RLE Weight Bearing Per Provider Order: Weight bearing as tolerated LLE Weight Bearing Per Provider Order: Weight bearing as tolerated  Therapy/Group: Individual Therapy  Roger Pace 04/25/2024, 3:07 PM

## 2024-04-25 NOTE — Plan of Care (Signed)
  Problem: Consults Goal: RH GENERAL PATIENT EDUCATION Description: See Patient Education module for education specifics. Outcome: Progressing   Problem: RH BOWEL ELIMINATION Goal: RH STG MANAGE BOWEL WITH ASSISTANCE Description: STG Manage Bowel with mod I Assistance. Outcome: Progressing   Problem: RH BLADDER ELIMINATION Goal: RH STG MANAGE BLADDER WITH ASSISTANCE Description: STG Manage Bladder With  mod I Assistance Outcome: Progressing   Problem: RH SKIN INTEGRITY Goal: RH STG SKIN FREE OF INFECTION/BREAKDOWN Description: Manage skin free of infection / breakdown with mod I assistance Outcome: Progressing   Problem: RH SAFETY Goal: RH STG ADHERE TO SAFETY PRECAUTIONS W/ASSISTANCE/DEVICE Description: STG Adhere to Safety Precautions With mod I  Assistance/Device. Outcome: Progressing   Problem: RH PAIN MANAGEMENT Goal: RH STG PAIN MANAGED AT OR BELOW PT'S PAIN GOAL Description: <4 w/ prns Outcome: Progressing   Problem: RH KNOWLEDGE DEFICIT GENERAL Goal: RH STG INCREASE KNOWLEDGE OF SELF CARE AFTER HOSPITALIZATION Description: Manage increase knowledge of self care after hospitalization with mod I assistance from wife using educational materials provided  Outcome: Progressing

## 2024-04-25 NOTE — Progress Notes (Signed)
 Physical Therapy Session Note  Patient Details  Name: Roger Pace MRN: [id number] Date of Birth: [birth year]  Today's Date: 04/25/2024 PT Individual Time: 0731-0842 PT Individual Time Calculation (min): 71 min   Short Term Goals: Week 1:  PT Short Term Goal 1 (Week 1): pt will perform sit to stand with RW and min A with LRAD PT Short Term Goal 2 (Week 1): pt will ambulate 100 feet with LRAD and min A PT Short Term Goal 3 (Week 1): pt will demonstrate ability to actively extend [name] to -5 deg or less to reduce dependence on knee immobilizer  Skilled Therapeutic Interventions/Progress Updates:   Received pt semi-reclined in bed, pt agreeable to PT treatment, and reported low pain, mainly stiffness in bilateral knees (premedicated). Session with emphasis on functional mobility/transfers, generalized strengthening and endurance, dynamic standing balance/coordination, and ambulation. Donned bilateral thigh high teds with max A and knee immobilizer with supervision. Pt transferred semi-reclined<>sitting R EOB with HOB elevated using gait belt to move BLEs with supervision and increased time.   Pt stood from extremly elevated EOB with RW and min A and performed stand<>pivot into WC with RW and CGA - noted difficulty flexing knees when transferring stand<>sit and sit<>stand and needing to extend knees prior to sitting. Pt performed WC mobility 164ft using BUE and supervision to main therapy gym with emphasis on UE strength/coordination. Worked on seated AAROM knee flexion x12 bilaterally with 5 second hold - 65 degrees knee flexion on RLE and 61 degrees flexion on LLE.  Stood from [facility] with RW and heavy mod A and ambulated 89ft with RW and min A fading to CGA. Pt ambulates at decreased cadence with heavy reliance on BUE support on RW and lacking knee flexion. Pt diaphoretic and required extended seated rest/water break in between ambulation bouts. Stood from elevated EOM with RW and min A and  ambulated additional 80ft with RW and CGA. Returned to room and concluded session with pt sitting in [facility] with all needs within reach.   Knee immobilizer on RLE initially, then transferred to LLE on 2nd ambulation bout - pt able to don/doff KI with supervision throughout session.   Vitals: Supine: BP: 125/62. SPO2 100%, HR 99bpm Sitting EOB: BP: 126/57 Standing EOB: 110/71 - slightly symptomatic Sitting after ambulating: 125/105 - symptomatic  Therapy Documentation Precautions:  Precautions Precautions: Fall, Knee Recall of Precautions/Restrictions: Intact Precaution/Restrictions Comments: Pt educated on need for knee extension at this time. Bed locked in extension, and education provided that NO pillow/roll/ice pack should be placed under the knee. Required Braces or Orthoses: Knee Immobilizer - Left Knee Immobilizer - Right: Other (comment) (Dr. Addie, try to alternate knee immobilizer between legs to allow each leg the chance to bear more of the load when mobilizing.) Knee Immobilizer - Left: Other (comment) (Dr. Addie, try to alternate knee immobilizer between legs to allow each leg the chance to bear more of the load when mobilizing.) Restrictions Weight Bearing Restrictions Per Provider Order: Yes RLE Weight Bearing Per Provider Order: Weight bearing as tolerated LLE Weight Bearing Per Provider Order: Weight bearing as tolerated  Therapy/Group: Individual Therapy Roger Pace Roger Pace PT, DPT 04/25/2024, 6:59 AM

## 2024-04-25 NOTE — Progress Notes (Signed)
 Physical Therapy Session Note  Patient Details  Name: Roger Pace MRN: [id number] Date of Birth: Jul 31, 1971  Today's Date: 04/25/2024 PT Individual Time: 1017-1102 PT Individual Time Calculation (min): 45 min   Short Term Goals: Week 1:  PT Short Term Goal 1 (Week 1): pt will perform sit to stand with RW and min A with LRAD PT Short Term Goal 2 (Week 1): pt will ambulate 100 feet with LRAD and min A PT Short Term Goal 3 (Week 1): pt will demonstrate ability to actively extend [name] to -5 deg or less to reduce dependence on knee immobilizer  Skilled Therapeutic Interventions/Progress Updates:  Pt was seen bedside in the am. Pt propelled w/c to gym with B UEs and S. In gym treatment focused on increased ROM B knees. Pt performed knee flexion seated in w/c for 3 second hold, 3 sets x 10 reps each. Pt performed quad sets B knees, 3 sets x 10 reps each with 5 second hold. Pt's ROM L knee knee flexion 67 degree and [name] flexion 69 degrees. Dicussed with pt in regards to increasing knee flexion with stretching B LEs while sitting up in w/c.  Pt propelled w/c back to room with B UEs and S. Pt left sitting up in bed with all needs within reach.   Therapy Documentation Precautions:  Precautions Precautions: Fall, Knee Recall of Precautions/Restrictions: Intact Precaution/Restrictions Comments: Pt educated on need for knee extension at this time. Bed locked in extension, and education provided that NO pillow/roll/ice pack should be placed under the knee. Required Braces or Orthoses: Knee Immobilizer - Left Knee Immobilizer - Right: Other (comment) (Dr. Addie, try to alternate knee immobilizer between legs to allow each leg the chance to bear more of the load when mobilizing.) Knee Immobilizer - Left: Other (comment) (Dr. Addie, try to alternate knee immobilizer between legs to allow each leg the chance to bear more of the load when mobilizing.) Restrictions Weight Bearing Restrictions Per  Provider Order: Yes RLE Weight Bearing Per Provider Order: Weight bearing as tolerated LLE Weight Bearing Per Provider Order: Weight bearing as tolerated General:   Pain: Pt c/o 4/10 [name] pain.      Therapy/Group: Individual Therapy  Roger Pace 04/25/2024, 12:06 PM

## 2024-04-26 DIAGNOSIS — D62 Acute posthemorrhagic anemia: Secondary | ICD-10-CM | POA: Diagnosis not present

## 2024-04-26 DIAGNOSIS — M170 Bilateral primary osteoarthritis of knee: Secondary | ICD-10-CM | POA: Diagnosis not present

## 2024-04-26 DIAGNOSIS — Z96653 Presence of artificial knee joint, bilateral: Secondary | ICD-10-CM | POA: Diagnosis not present

## 2024-04-26 DIAGNOSIS — D72823 Leukemoid reaction: Secondary | ICD-10-CM | POA: Diagnosis not present

## 2024-04-26 LAB — CBC
HCT: 22.7 % — ABNORMAL LOW (ref 39.0–52.0)
Hemoglobin: 7.3 g/dL — ABNORMAL LOW (ref 13.0–17.0)
MCH: 30.7 pg (ref 26.0–34.0)
MCHC: 32.2 g/dL (ref 30.0–36.0)
MCV: 95.4 fL (ref 80.0–100.0)
Platelets: 655 K/uL — ABNORMAL HIGH (ref 150–400)
RBC: 2.38 MIL/uL — ABNORMAL LOW (ref 4.22–5.81)
RDW: 15.3 % (ref 11.5–15.5)
WBC: 14.1 K/uL — ABNORMAL HIGH (ref 4.0–10.5)
nRBC: 0.9 % — ABNORMAL HIGH (ref 0.0–0.2)

## 2024-04-26 LAB — OCCULT BLOOD X 1 CARD TO LAB, STOOL: Fecal Occult Bld: NEGATIVE

## 2024-04-26 NOTE — Plan of Care (Signed)
  Problem: Consults Goal: RH GENERAL PATIENT EDUCATION Description: See Patient Education module for education specifics. Outcome: Progressing   Problem: RH BOWEL ELIMINATION Goal: RH STG MANAGE BOWEL WITH ASSISTANCE Description: STG Manage Bowel with mod I Assistance. Outcome: Progressing   Problem: RH BLADDER ELIMINATION Goal: RH STG MANAGE BLADDER WITH ASSISTANCE Description: STG Manage Bladder With  mod I Assistance Outcome: Progressing   Problem: RH SKIN INTEGRITY Goal: RH STG SKIN FREE OF INFECTION/BREAKDOWN Description: Manage skin free of infection / breakdown with mod I assistance Outcome: Progressing   Problem: RH SAFETY Goal: RH STG ADHERE TO SAFETY PRECAUTIONS W/ASSISTANCE/DEVICE Description: STG Adhere to Safety Precautions With mod I  Assistance/Device. Outcome: Progressing   Problem: RH PAIN MANAGEMENT Goal: RH STG PAIN MANAGED AT OR BELOW PT'S PAIN GOAL Description: <4 w/ prns Outcome: Progressing   Problem: RH KNOWLEDGE DEFICIT GENERAL Goal: RH STG INCREASE KNOWLEDGE OF SELF CARE AFTER HOSPITALIZATION Description: Manage increase knowledge of self care after hospitalization with mod I assistance from wife using educational materials provided  Outcome: Progressing

## 2024-04-26 NOTE — Progress Notes (Signed)
 PROGRESS NOTE   Subjective/Complaints: Was side lying in bed when I came in this morning as he was experiencing buttocks discomfort. Otherwise doing well. CPM only to about 60-65 degrees bilaterally  ROS: Patient denies fever, rash, sore throat, blurred vision, dizziness, nausea, vomiting, diarrhea, cough, shortness of breath or chest pain,   back/neck pain, headache, or mood change.   Objective:   No results found.  Recent Labs    04/25/24 0433 04/26/24 0453  WBC 14.0* 14.1*  HGB 7.5* 7.3*  HCT 23.1* 22.7*  PLT 636* 655*   Recent Labs    04/25/24 0433  NA 134*  K 4.3  CL 101  CO2 20*  GLUCOSE 115*  BUN 15  CREATININE 1.14  CALCIUM  8.7*    Intake/Output Summary (Last 24 hours) at 04/26/2024 0821 Last data filed at 04/26/2024 0420 Gross per 24 hour  Intake 660 ml  Output 1675 ml  Net -1015 ml        Physical Exam: Vital Signs Blood pressure 116/69, pulse (!) 102, temperature 98.8 F (37.1 C), resp. rate 19, height 5' 11.5 (1.816 m), weight 118 kg, SpO2 100%.  Constitutional: No distress . Vital signs reviewed. HEENT: NCAT, EOMI, oral membranes moist Neck: supple Cardiovascular: RRR without murmur. No JVD    Respiratory/Chest: CTA Bilaterally without wheezes or rales. Normal effort    GI/Abdomen: BS +, non-tender, non-distended Ext: no clubbing, cyanosis, or edema Psych: pleasant and cooperative  Skin: Clean and intact without signs of breakdown. Knee incisions appear intact with post-op dressings in place.Mild discomfort with palpation around the medial cleft of right buttock. No signs of irritation or breakdown. Neuro:      Mental Status: AAOx3, good insight and awareness Speech/Languate: Follows commands CRANIAL NERVES: 2-12 grossly intact   MOTOR: RUE: 5/5 Deltoid, 5/5 Biceps, 5/5 Triceps,5/5 Grip LUE: 5/5 Deltoid, 5/5 Biceps, 5/5 Triceps, 5/5 Grip RLE: HF 2/5, KE 2/5, ADF 4/5, APF 4/5 LLE: HF  2/5, KE 2/5, ADF 4/5, APF 4/5   SENSORY: Normal to touch all 4 extremities  MSK: both knees somewhat sensitive to palpation  CPM machine in room   Prior neuro assessment is c/w today's exam 04/26/2024.   Assessment/Plan: 1. Functional deficits which require 3+ hours per day of interdisciplinary therapy in a comprehensive inpatient rehab setting. Physiatrist is providing close team supervision and 24 hour management of active medical problems listed below. Physiatrist and rehab team continue to assess barriers to discharge/monitor patient progress toward functional and medical goals  Care Tool:  Bathing    Body parts bathed by patient: Right arm, Left arm, Chest, Abdomen, Front perineal area, Right upper leg, Left upper leg   Body parts bathed by helper: Left lower leg, Right lower leg, Buttocks     Bathing assist Assist Level: Maximal Assistance - Patient 24 - 49%     Upper Body Dressing/Undressing Upper body dressing        Upper body assist Assist Level: Set up assist    Lower Body Dressing/Undressing Lower body dressing            Lower body assist Assist for lower body dressing: Maximal Assistance - Patient 25 - 49%  Toileting Toileting    Toileting assist Assist for toileting: Maximal Assistance - Patient 25 - 49%     Transfers Chair/bed transfer  Transfers assist     Chair/bed transfer assist level: Minimal Assistance - Patient > 75%     Locomotion Ambulation   Ambulation assist      Assist level: Minimal Assistance - Patient > 75% Assistive device: Walker-rolling Max distance: 54ft   Walk 10 feet activity   Assist     Assist level: Minimal Assistance - Patient > 75% Assistive device: Walker-rolling   Walk 50 feet activity   Assist Walk 50 feet with 2 turns activity did not occur: Safety/medical concerns (fatigue/pain)  Assist level: Minimal Assistance - Patient > 75% Assistive device: Walker-rolling    Walk 150 feet  activity   Assist Walk 150 feet activity did not occur: Safety/medical concerns         Walk 10 feet on uneven surface  activity   Assist Walk 10 feet on uneven surfaces activity did not occur: Safety/medical concerns         Wheelchair     Assist Is the patient using a wheelchair?: Yes Type of Wheelchair: Manual    Wheelchair assist level: Supervision/Verbal cueing Max wheelchair distance: 150    Wheelchair 50 feet with 2 turns activity    Assist        Assist Level: Supervision/Verbal cueing   Wheelchair 150 feet activity     Assist      Assist Level: Supervision/Verbal cueing   Blood pressure 116/69, pulse (!) 102, temperature 98.8 F (37.1 C), resp. rate 19, height 5' 11.5 (1.816 m), weight 118 kg, SpO2 100%.  Medical Problem List and Plan: 1. Functional deficits secondary to B/L knee OA s/p b/l TKA complicated by acute PE             -patient may shower, cover incisions please             -ELOS/Goals: 12-15 days, PT/OT Mod I             -Discussed with Dr. Addie, appreciate assistance, pt to alternate knee immobilizers between legs, he can DC this when he can do 10 straight leg raises with each leg   -continue CPM  -Continue CIR therapies including PT, OT. Encouraged pt to push himself more on the CPM. He's only around 60 degrees currently  2.  Acute DVT/PE/Antithrombotics: -DVT/anticoagulation:  Pharmaceutical: Xarelto              -antiplatelet therapy: N/A 3. Pain Management: oxycodone  and robaxin  prn.  -appears controlled 4. Mood/Behavior/Sleep: LCSW to follow for evaluation and support.              -antipsychotic agents: N/A 5. Neuropsych/cognition: This patient is capable of making decisions on his own behalf. 6. Skin/Wound Care: Routine pressure relief measures. --Surgical dressings intact on both knees.  -9/7 buttock tenderness. I didn't see any signs of breakdown or pressure. Will continue to monitor, encouraged up to chair as  much as possible. Nursing to follow up 7. Fluids/Electrolytes/Nutrition: Monitor I/O. Check CMET in am  8. 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 Addie 9. BP/Orthostatic hypotension: BP improving. Continue to monitor.  10.  Hyponatremia/Hypochloremia: Improving with improvement in intake? -9/4 hyponatremia improved to 134, potassium stable at 4.5 11. ABLA: Recheck CBC in am. HGB 11.5 on 8/29  - 9/3 Hemoglobin down to 7.3, recheck today, FOBT.  Will recheck tomorrow to ensure it stable  -9/5  FOBT negative, hemoglobin a little improved.  Continue to monitor daily for now.  Xarelto  continued for now  9/7-sl dip this weekend (7.3), but he was at similar numbers mid last week   -continue to monitor serially 12.  Low grade fevers: T-max 100.3 yesterday evening.  --Continue to monitor for signs of infection. CXR and UA neg --BC X 2  08/30 -->negative/pending.  -9/3 WBC a little improved to 12.7 -9/4 recheck chest x-ray, lactic acid, procalcitonin, urinalysis, blood cultures -9/5 lactic acid 2.2 initially yesterday but improved to 1.6, had fluid bolus.  Blood cultures with no growth, urine does not indicate infection, chest x-ray without acute changes 9/7 WBC's still 14k. UA negative. Wounds look ok. CXR NAD  -BCx NG x2d   -monitor only for now 13. Constipation: Up all night after mag citrate.    -Continue Miralax  decrease to daily, senokot  -Dulcolax supp  PRN  -9/4 LBM today a little loose, decrease Senokot from twice daily to at bedtime -9/6 LBM  15. Impaired fasting glucose:  Check AIc in am. A1C 4.9 16. Orthostatic hypotension  - Encourage fluid intake, order abdominal binder      04/26/2024    4:22 AM 04/25/2024    8:14 PM 04/25/2024    3:37 PM  Vitals with BMI  Systolic 116 125 860  Diastolic 69 69 72  Pulse  102 898   17.  Transaminitis.  Has history of chronic mild elevation.  Continue to monitor        LOS: 4 days A FACE TO FACE EVALUATION WAS PERFORMED  Roger Pace 04/26/2024, 8:21 AM

## 2024-04-27 DIAGNOSIS — K5900 Constipation, unspecified: Secondary | ICD-10-CM | POA: Diagnosis not present

## 2024-04-27 DIAGNOSIS — I2699 Other pulmonary embolism without acute cor pulmonale: Secondary | ICD-10-CM | POA: Diagnosis not present

## 2024-04-27 DIAGNOSIS — D75839 Thrombocytosis, unspecified: Secondary | ICD-10-CM

## 2024-04-27 DIAGNOSIS — M170 Bilateral primary osteoarthritis of knee: Secondary | ICD-10-CM | POA: Diagnosis not present

## 2024-04-27 DIAGNOSIS — D62 Acute posthemorrhagic anemia: Secondary | ICD-10-CM | POA: Diagnosis not present

## 2024-04-27 LAB — CBC
HCT: 24.1 % — ABNORMAL LOW (ref 39.0–52.0)
Hemoglobin: 7.7 g/dL — ABNORMAL LOW (ref 13.0–17.0)
MCH: 31 pg (ref 26.0–34.0)
MCHC: 32 g/dL (ref 30.0–36.0)
MCV: 97.2 fL (ref 80.0–100.0)
Platelets: 724 K/uL — ABNORMAL HIGH (ref 150–400)
RBC: 2.48 MIL/uL — ABNORMAL LOW (ref 4.22–5.81)
RDW: 15.1 % (ref 11.5–15.5)
WBC: 11.8 K/uL — ABNORMAL HIGH (ref 4.0–10.5)
nRBC: 0.7 % — ABNORMAL HIGH (ref 0.0–0.2)

## 2024-04-27 LAB — BASIC METABOLIC PANEL WITH GFR
Anion gap: 8 (ref 5–15)
BUN: 15 mg/dL (ref 6–20)
CO2: 24 mmol/L (ref 22–32)
Calcium: 8.8 mg/dL — ABNORMAL LOW (ref 8.9–10.3)
Chloride: 101 mmol/L (ref 98–111)
Creatinine, Ser: 1.03 mg/dL (ref 0.61–1.24)
GFR, Estimated: >60 mL/min (ref >60)
Glucose, Bld: 113 mg/dL — ABNORMAL HIGH (ref 70–99)
Potassium: 4.5 mmol/L (ref 3.5–5.1)
Sodium: 133 mmol/L — ABNORMAL LOW (ref 135–145)

## 2024-04-27 LAB — HEPATIC FUNCTION PANEL
ALT: 83 U/L — ABNORMAL HIGH (ref 0–44)
AST: 86 U/L — ABNORMAL HIGH (ref 15–41)
Albumin: 2.7 g/dL — ABNORMAL LOW (ref 3.5–5.0)
Alkaline Phosphatase: 76 U/L (ref 38–126)
Bilirubin, Direct: 0.2 mg/dL (ref 0.0–0.2)
Indirect Bilirubin: 0.9 mg/dL (ref 0.3–0.9)
Total Bilirubin: 1.1 mg/dL (ref 0.0–1.2)
Total Protein: 7.3 g/dL (ref 6.5–8.1)

## 2024-04-27 NOTE — Progress Notes (Signed)
 Patient stable.  Pain controlled.  Was able to walk over 350 feet earlier. Using CPM and ice compression Range of motion still is around 70 degrees of flexion for each knee. Near full extension within 5 degrees Bilaterally.  Plan at this time is to work more on flexion.  Would like him to have at least 80 degrees of flexion in each knee by the end of the week.  Aquacel dressing removed today.  Both incisions intact.  No need for them to be covered at this point.

## 2024-04-27 NOTE — Discharge Instructions (Signed)
 Inpatient Rehab Discharge Instructions  CORDERRO KOLOSKI Discharge date and time: 05/01/24   Activities/Precautions/ Functional Status: Activity: no lifting, driving, or strenuous exercise till cleared by MD Diet: regular diet Wound Care: keep wound clean and dry. Contact MD if you develop any problems with your incision/wound--redness, swelling, increase in pain, drainage or if you develop fever or chills.    Functional status:  ___ No restrictions     ___ Walk up steps independently ___ 24/7 supervision/assistance   ___ Walk up steps with assistance ___ Intermittent supervision/assistance  ___ Bathe/dress independently ___ Walk with walker     ___ Bathe/dress with assistance ___ Walk Independently    ___ Shower independently ___ Walk with assistance    ___ Shower with assistance ___ No alcohol     ___ Return to work/school ________  Special Instructions:  COMMUNITY REFERRALS UPON DISCHARGE:    Home Health:   PT     OT         Agency: [facility] [phone]    Medical Equipment/Items Ordered:Bedside commode, Walker rolling                                                 Agency/Supplier: [facility]  My questions have been answered and I understand these instructions. I will adhere to these goals and the provided educational materials after my discharge from the hospital.  Patient/Caregiver Signature _______________________________ Date __________  Clinician Signature _______________________________________ Date __________  Please bring this form and your medication list with you to all your follow-up doctor's appointments.   Information on my medicine - XARELTO  (rivaroxaban )  This medication education was reviewed with me or my healthcare representative as part of my discharge preparation.  The pharmacist that spoke with me during my hospital stay was:  Gretta Levorn Requena, [facility]  WHY WAS XARELTO  PRESCRIBED FOR YOU? Xarelto  was prescribed to treat blood  clots that may have been found in the veins of your legs (deep vein thrombosis) or in your lungs (pulmonary embolism) and to reduce the risk of them occurring again.  What do you need to know about Xarelto ? The starting dose is one 15 mg tablet taken TWICE daily with food for the FIRST 21 DAYS then on 05/07/2024  the dose is changed to one 20 mg tablet taken ONCE A DAY with your evening meal.  DO NOT stop taking Xarelto  without talking to the health care provider who prescribed the medication.  Refill your prescription for 20 mg tablets before you run out.  After discharge, you should have regular check-up appointments with your healthcare provider that is prescribing your Xarelto .  In the future your dose may need to be changed if your kidney function changes by a significant amount.  What do you do if you miss a dose? If you are taking Xarelto  TWICE DAILY and you miss a dose, take it as soon as you remember. You may take two 15 mg tablets (total 30 mg) at the same time then resume your regularly scheduled 15 mg twice daily the next day.  If you are taking Xarelto  ONCE DAILY and you miss a dose, take it as soon as you remember on the same day then continue your regularly scheduled once daily regimen the next day. Do not take two doses of Xarelto  at the same time.   Important Safety Information Xarelto   is a blood thinner medicine that can cause bleeding. You should call your healthcare provider right away if you experience any of the following: Bleeding from an injury or your nose that does not stop. Unusual colored urine (red or dark brown) or unusual colored stools (red or black). Unusual bruising for unknown reasons. A serious fall or if you hit your head (even if there is no bleeding).  Some medicines may interact with Xarelto  and might increase your risk of bleeding while on Xarelto . To help avoid this, consult your healthcare provider or pharmacist prior to using any new  prescription or non-prescription medications, including herbals, vitamins, non-steroidal anti-inflammatory drugs (NSAIDs) and supplements.  This website has more information on Xarelto : [email or web address] .com.

## 2024-04-27 NOTE — Progress Notes (Signed)
 Physical Therapy Session Note  Patient Details  Name: Roger Pace MRN: [id number] Date of Birth: Dec 22, 1970  {CHL IP REHAB PT TIME CALCULATION:[id number]}  Short Term Goals: Week 1:  PT Short Term Goal 1 (Week 1): pt will perform sit to stand with RW and min A with LRAD PT Short Term Goal 2 (Week 1): pt will ambulate 100 feet with LRAD and min A PT Short Term Goal 3 (Week 1): pt will demonstrate ability to actively extend [name] to -5 deg or less to reduce dependence on knee immobilizer  Skilled Therapeutic Interventions/Progress Updates:      Pt seated in WC upon arrival. Pt agreeable to therapy. Pt reports 7/10 pain LLE > [name]. Pt wearing [name] KI at start of session. Pt requesting pain medicine. Notified nurse. Nurse present at start of session to administer medication. Pt wearing [name] thigh high ted hose, pt denies any dizziness/lightheadedness throughout session.   WC propulsion: Pt self propelled WC room to ortho gym with B UE and supervision.   Transfers: pt performed stand pivot transfer from elevated surface with RW and min A, pt heavily dependent on B UE support, PT providing assistance for knee flexion (with LE without KI), pt utilizing LE with KI as kick stand. Pt performed stand pivot transfer WC to car simulator with min A (pt wearing KI on [name]), pt requires assistance for mangement of [name], pt scooter posteriorly in seat into long sitting position. Prolonged seated rest break provided for fatigue management.   Gait: pt ambulated 1x36 (pt wearing KI on R leg)  1x168 feet (pt wear KI on L LE-ortho gym to room) with RW and CGA, verbal cues provided for reciprocal gait and upright posture.   Pt performed the following therex for [name] strength/ROM:   1x10 [name] ***  Therapy Documentation Precautions:  Precautions Precautions: Fall, Knee Recall of Precautions/Restrictions: Intact Precaution/Restrictions Comments: Pt educated on need for knee extension at this time. Bed  locked in extension, and education provided that NO pillow/roll/ice pack should be placed under the knee. Required Braces or Orthoses: Knee Immobilizer - Left Knee Immobilizer - Right: Other (comment) (Dr. Addie, try to alternate knee immobilizer between legs to allow each leg the chance to bear more of the load when mobilizing.) Knee Immobilizer - Left: Other (comment) (Dr. Addie, try to alternate knee immobilizer between legs to allow each leg the chance to bear more of the load when mobilizing.) Restrictions Weight Bearing Restrictions Per Provider Order: Yes RLE Weight Bearing Per Provider Order: Weight bearing as tolerated LLE Weight Bearing Per Provider Order: Weight bearing as tolerated   Therapy/Group: Individual Therapy  [facility] Doreene Orris, [place], DPT  04/27/2024, 8:37 AM

## 2024-04-27 NOTE — Discharge Summary (Signed)
 Physician Discharge Summary      Patient ID: Roger Pace MRN: [id number] DOB/AGE: 54/02/1971 52 y.o.  Admit date: 04/14/2024 Discharge date: 04/22/2024  Admission Diagnoses:  Principal Problem:   S/P TKR (total knee replacement), bilateral Active Problems:   Class 2 obesity without serious comorbidity with body mass index (BMI) of 38.0 to 38.9 in adult   DVT (deep venous thrombosis) (HCC)   Tachycardia   Fever   Arthritis of right knee   Arthritis of left knee   Discharge Diagnoses:  Same  Surgeries: Procedure(s): ARTHROPLASTY, KNEE, BILATERAL, TOTAL REMOVAL, HARDWARE on 04/14/2024   Consultants: Treatment Team:  Seena Marsa NOVAK, MD Trixie Nilda HERO, MD Jonel Lonni SQUIBB, MD  Discharged Condition: [facility] Course: Roger Pace is an 53 y.o. male who was admitted 04/14/2024 with a chief complaint of bilateral knee pain, and found to have a diagnosis of bilateral knee arthritis.  They were brought to the operating room on 04/14/2024 and underwent the above named procedures.  Pt awoke from anesthesia without complication and was transferred to the floor. On POD1, patient's pain was overall controlled.  He was able to mobilize with PT quite well over the first few days postoperatively.  On 04/16/2024, screening ultrasounds of bilateral calves demonstrated bilateral lower extremity DVTs.  He was fairly asymptomatic from these until he developed orthostatic hypotension in the following days and had CTA demonstrating right sided pulmonary emboli.  His mobility improved and he was discharged to inpatient rehab where he remains as of today (04/27/2024)..  Pt will f/u with Dr. Addie 2 weeks postoperatively.  Antibiotics given:  Anti-infectives (From admission, onward)    Start     Dose/Rate Route Frequency Ordered Stop   04/14/24 1630  ceFAZolin  (ANCEF ) IVPB 2g/100 mL premix        2 g 200 mL/hr over 30 Minutes Intravenous Every 8 hours 04/14/24 1536 04/15/24  1639   04/14/24 0930  vancomycin  (VANCOCIN ) powder  Status:  Discontinued          As needed 04/14/24 1054 04/14/24 1305   04/14/24 0615  ceFAZolin  (ANCEF ) IVPB 3g/150 mL premix        3 g 300 mL/hr over 30 Minutes Intravenous On call to O.R. 04/14/24 0543 04/14/24 1203   04/14/24 0545  ceFAZolin  (ANCEF ) 3-4 GM/150ML-% IVPB       Note to Pharmacy: Junette Massa A: cabinet override      04/14/24 0545 04/14/24 0753     .  Recent vital signs:  Vitals:   04/22/24 0611 04/22/24 0755  BP: (!) 148/66 (!) 145/68  Pulse: (!) 101 (!) 105  Resp: 17 18  Temp: 97.7 F (36.5 C) 98.8 F (37.1 C)  SpO2: 100% 100%    Recent laboratory studies:  Results for orders placed or performed during the hospital encounter of 04/14/24  CBC   Collection Time: 04/16/24  1:47 PM  Result Value Ref Range   WBC 14.3 (H) 4.0 - 10.5 K/uL   RBC 3.81 (L) 4.22 - 5.81 MIL/uL   Hemoglobin 11.9 (L) 13.0 - 17.0 g/dL   HCT 64.8 (L) 60.9 - 47.9 %   MCV 92.1 80.0 - 100.0 fL   MCH 31.2 26.0 - 34.0 pg   MCHC 33.9 30.0 - 36.0 g/dL   RDW 86.6 88.4 - 84.4 %   Platelets 232 150 - 400 K/uL   nRBC 0.0 0.0 - 0.2 %  Basic metabolic panel   Collection Time: 04/16/24  1:47  PM  Result Value Ref Range   Sodium 133 (L) 135 - 145 mmol/L   Potassium 3.7 3.5 - 5.1 mmol/L   Chloride 99 98 - 111 mmol/L   CO2 23 22 - 32 mmol/L   Glucose, Bld 147 (H) 70 - 99 mg/dL   BUN 13 6 - 20 mg/dL   Creatinine, Ser 8.90 0.61 - 1.24 mg/dL   Calcium  8.9 8.9 - 10.3 mg/dL   GFR, Estimated >39 >39 mL/min   Anion gap 11 5 - 15  Urinalysis, Routine w reflex microscopic -Urine, Clean Catch   Collection Time: 04/16/24  2:32 PM  Result Value Ref Range   Color, Urine YELLOW YELLOW   APPearance CLEAR CLEAR   Specific Gravity, Urine 1.012 1.005 - 1.030   pH 6.0 5.0 - 8.0   Glucose, UA 50 (A) NEGATIVE mg/dL   Hgb urine dipstick SMALL (A) NEGATIVE   Bilirubin Urine NEGATIVE NEGATIVE   Ketones, ur NEGATIVE NEGATIVE mg/dL   Protein, ur NEGATIVE  NEGATIVE mg/dL   Nitrite NEGATIVE NEGATIVE   Leukocytes,Ua NEGATIVE NEGATIVE   RBC / HPF 0-5 0 - 5 RBC/hpf   WBC, UA 0-5 0 - 5 WBC/hpf   Bacteria, UA NONE SEEN NONE SEEN   Squamous Epithelial / HPF 0-5 0 - 5 /HPF   Mucus PRESENT   CBC with Differential/Platelet   Collection Time: 04/17/24  7:50 AM  Result Value Ref Range   WBC 14.4 (H) 4.0 - 10.5 K/uL   RBC 3.69 (L) 4.22 - 5.81 MIL/uL   Hemoglobin 11.5 (L) 13.0 - 17.0 g/dL   HCT 66.2 (L) 60.9 - 47.9 %   MCV 91.3 80.0 - 100.0 fL   MCH 31.2 26.0 - 34.0 pg   MCHC 34.1 30.0 - 36.0 g/dL   RDW 86.8 88.4 - 84.4 %   Platelets 229 150 - 400 K/uL   nRBC 0.0 0.0 - 0.2 %   Neutrophils Relative % 71 %   Neutro Abs 10.3 (H) 1.7 - 7.7 K/uL   Lymphocytes Relative 18 %   Lymphs Abs 2.5 0.7 - 4.0 K/uL   Monocytes Relative 11 %   Monocytes Absolute 1.5 (H) 0.1 - 1.0 K/uL   Eosinophils Relative 0 %   Eosinophils Absolute 0.0 0.0 - 0.5 K/uL   Basophils Relative 0 %   Basophils Absolute 0.0 0.0 - 0.1 K/uL   Immature Granulocytes 0 %   Abs Immature Granulocytes 0.06 0.00 - 0.07 K/uL  Comprehensive metabolic panel   Collection Time: 04/17/24  7:50 AM  Result Value Ref Range   Sodium 134 (L) 135 - 145 mmol/L   Potassium 3.5 3.5 - 5.1 mmol/L   Chloride 95 (L) 98 - 111 mmol/L   CO2 24 22 - 32 mmol/L   Glucose, Bld 138 (H) 70 - 99 mg/dL   BUN 11 6 - 20 mg/dL   Creatinine, Ser 8.87 0.61 - 1.24 mg/dL   Calcium  8.9 8.9 - 10.3 mg/dL   Total Protein 7.1 6.5 - 8.1 g/dL   Albumin 3.0 (L) 3.5 - 5.0 g/dL   AST 40 15 - 41 U/L   ALT 18 0 - 44 U/L   Alkaline Phosphatase 33 (L) 38 - 126 U/L   Total Bilirubin 1.2 0.0 - 1.2 mg/dL   GFR, Estimated >39 >39 mL/min   Anion gap 15 5 - 15  Culture, blood (Routine X 2) w Reflex to ID Panel   Collection Time: 04/18/24 10:40 AM   Specimen: BLOOD LEFT  ARM  Result Value Ref Range   Specimen Description BLOOD LEFT ARM    Special Requests      BOTTLES DRAWN AEROBIC AND ANAEROBIC Blood Culture results may not be  optimal due to an inadequate volume of blood received in culture bottles   Culture      NO GROWTH 5 DAYS Performed at [facility], [address] [place], [place] [address]    Report Status 04/23/2024 FINAL   Culture, blood (Routine X 2) w Reflex to ID Panel   Collection Time: 04/18/24 10:53 AM   Specimen: BLOOD LEFT ARM  Result Value Ref Range   Specimen Description BLOOD LEFT ARM    Special Requests      BOTTLES DRAWN AEROBIC AND ANAEROBIC Blood Culture results may not be optimal due to an inadequate volume of blood received in culture bottles   Culture      NO GROWTH 5 DAYS Performed at [facility], [address] [place], [place] [address]    Report Status 04/23/2024 FINAL   ECHOCARDIOGRAM COMPLETE   Collection Time: 04/19/24  1:07 PM  Result Value Ref Range   Weight 4,160 oz   Height 71.5 in   BP 149/72 mmHg   S' Lateral 2.80 cm   AR max vel 3.48 cm2   AV Peak grad 9.7 mmHg   Ao pk vel 1.56 m/s   Area-P 1/2 5.02 cm2   Est EF 65 - 70%   Basic metabolic panel   Collection Time: 04/20/24 12:09 PM  Result Value Ref Range   Sodium 128 (L) 135 - 145 mmol/L   Potassium 4.0 3.5 - 5.1 mmol/L   Chloride 92 (L) 98 - 111 mmol/L   CO2 24 22 - 32 mmol/L   Glucose, Bld 133 (H) 70 - 99 mg/dL   BUN 18 6 - 20 mg/dL   Creatinine, Ser 8.84 0.61 - 1.24 mg/dL   Calcium  8.2 (L) 8.9 - 10.3 mg/dL   GFR, Estimated >39 >39 mL/min   Anion gap 12 5 - 15  Basic metabolic panel with GFR   Collection Time: 04/22/24  3:34 AM  Result Value Ref Range   Sodium 131 (L) 135 - 145 mmol/L   Potassium 4.3 3.5 - 5.1 mmol/L   Chloride 96 (L) 98 - 111 mmol/L   CO2 25 22 - 32 mmol/L   Glucose, Bld 115 (H) 70 - 99 mg/dL   BUN 15 6 - 20 mg/dL   Creatinine, Ser 9.00 0.61 - 1.24 mg/dL   Calcium  8.2 (L) 8.9 - 10.3 mg/dL   GFR, Estimated >39 >39 mL/min   Anion gap 10 5 - 15    Discharge Medications:   Allergies as of 04/22/2024       Reactions   Shellfish Allergy Hives         Medication List     STOP taking these medications    CREATINE PO       TAKE these medications    acetaminophen  325 MG tablet Commonly known as: TYLENOL  Take 1-2 tablets (325-650 mg total) by mouth every 6 (six) hours as needed for mild pain (pain score 1-3) or fever (or temp > 100.5).   b complex vitamins capsule Take 1 capsule by mouth at bedtime.   celecoxib  200 MG capsule Commonly known as: CELEBREX  Take 1 capsule (200 mg total) by mouth 2 (two) times daily as needed. What changed:  how much to take when to take this reasons  to take this   COLLAGEN PO Take 1 capsule by mouth at bedtime.   docusate sodium  100 MG capsule Commonly known as: COLACE Take 1 capsule (100 mg total) by mouth 2 (two) times daily.   gabapentin  100 MG capsule Commonly known as: NEURONTIN  Take 2 capsules (200 mg total) by mouth 3 (three) times daily.   MAGNESIUM  PO Take 1 capsule by mouth at bedtime.   methocarbamol  500 MG tablet Commonly known as: ROBAXIN  Take 1 tablet (500 mg total) by mouth every 6 (six) hours as needed for muscle spasms.   oxyCODONE  5 MG immediate release tablet Commonly known as: Oxy IR/ROXICODONE  Take 1-2 tablets (5-10 mg total) by mouth every 4 (four) hours as needed for moderate pain (pain score 4-6) (pain score 4-6).   POTASSIUM PO Take 1 capsule by mouth at bedtime.   Rivaroxaban  Starter Pack (15 mg and 20 mg) Commonly known as: XARELTO  STARTER PACK Follow package directions: Take one 15mg  tablet by mouth twice a day. On day 22, switch to one 20mg  tablet once a day. Take with food.   VITAMIN C PO Take 1 tablet by mouth at bedtime.   VITAMIN D -3 PO Take 1 tablet by mouth at bedtime.        Diagnostic Studies: DG Chest 2 View Result Date: 04/23/2024 CLINICAL DATA:  Fever. EXAM: CHEST - 2 VIEW COMPARISON:  Chest radiograph dated 04/16/2024. CTA chest dated 04/20/2024. FINDINGS: The heart size and mediastinal contours are unchanged. No focal  consolidation, pleural effusion, or pneumothorax. No acute osseous abnormality. IMPRESSION: No acute cardiopulmonary findings. Electronically Signed   By: Harrietta Sherry M.D.   On: 04/23/2024 19:00   CT Angio Chest Pulmonary Embolism (PE) W or WO Contrast Addendum Date: 04/20/2024 ADDENDUM REPORT: 04/20/2024 16:12 ADDENDUM: Critical Value/emergent results were called by telephone at the time of interpretation on 04/20/2024 at 4:11 pm to provider COSTIN GHERGHE , who verbally acknowledged these results. Electronically Signed   By: Leita Birmingham M.D.   On: 04/20/2024 16:12   Result Date: 04/20/2024 CLINICAL DATA:  Pulmonary embolism suspected, high probability. EXAM: CT ANGIOGRAPHY CHEST WITH CONTRAST TECHNIQUE: Multidetector CT imaging of the chest was performed using the standard protocol during bolus administration of intravenous contrast. Multiplanar CT image reconstructions and MIPs were obtained to evaluate the vascular anatomy. RADIATION DOSE REDUCTION: This exam was performed according to the departmental dose-optimization program which includes automated exposure control, adjustment of the mA and/or kV according to patient size and/or use of iterative reconstruction technique. CONTRAST:  75mL OMNIPAQUE  IOHEXOL  350 MG/ML SOLN COMPARISON:  None Available. FINDINGS: Cardiovascular: The heart is normal in size and there is no pericardial effusion. There is atherosclerotic calcification of the aorta without evidence of aneurysm. The pulmonary trunk is normal in caliber. Examination is limited due to mixing artifact and respiratory motion. Segmental and subsegmental pulmonary emboli are present in the right lower lobe. No definite evidence of right heart strain. Mediastinum/Nodes: No enlarged mediastinal, hilar, or axillary lymph nodes. Thyroid  gland, trachea, and esophagus demonstrate no significant findings. Lungs/Pleura: Mild atelectasis is noted bilaterally. No effusion or pneumothorax. Upper Abdomen: No  acute abnormality. Musculoskeletal: No acute osseous abnormality. Review of the MIP images confirms the above findings. IMPRESSION: 1. Segmental and subsegmental pulmonary emboli in the right lower lobe. No evidence of right heart strain. 2. Aortic atherosclerosis. Electronically Signed: By: Leita Birmingham M.D. On: 04/20/2024 15:56   ECHOCARDIOGRAM COMPLETE Result Date: 04/19/2024    ECHOCARDIOGRAM REPORT   Patient Name:  Roger Pace Date of Exam: 04/19/2024 Medical Rec #:  [id number]         Height:       71.5 in Accession #:    [id number]        Weight:       260.0 lb Date of Birth:  [birth year]         BSA:          2.370 m Patient Age:    52 years          BP:           137/63 mmHg Patient Gender: M                 HR:           117 bpm. Exam Location:  Inpatient Procedure: 2D Echo, Cardiac Doppler and Color Doppler (Both Spectral and Color            Flow Doppler were utilized during procedure). Indications:    Abnormal ECG R94.31  History:        Patient has no prior history of Echocardiogram examinations.                 Arrythmias:Tachycardia.  Sonographer:    Thea Norlander RCS Referring Phys: [phone] NILDA HERO GHERGHE IMPRESSIONS  1. Left ventricular ejection fraction, by estimation, is 65 to 70%. The left ventricle has normal function. The left ventricle has no regional wall motion abnormalities. Left ventricular diastolic parameters were normal.  2. Right ventricular systolic function is hyperdynamic. The right ventricular size is mildly enlarged.  3. The mitral valve is normal in structure. No evidence of mitral valve regurgitation. No evidence of mitral stenosis.  4. The aortic valve is tricuspid. Aortic valve regurgitation is not visualized. No aortic stenosis is present.  5. Aortic dilatation noted. There is mild dilatation of the aortic root, measuring 40 mm.  6. The inferior vena cava is normal in size with greater than 50% respiratory variability, suggesting right atrial pressure of 3 mmHg.  Comparison(s): No prior Echocardiogram. FINDINGS  Left Ventricle: Left ventricular ejection fraction, by estimation, is 65 to 70%. The left ventricle has normal function. The left ventricle has no regional wall motion abnormalities. The left ventricular internal cavity size was normal in size. There is  no left ventricular hypertrophy. Left ventricular diastolic parameters were normal. Right Ventricle: The right ventricular size is mildly enlarged. No increase in right ventricular wall thickness. Right ventricular systolic function is hyperdynamic. Left Atrium: Left atrial size was normal in size. Right Atrium: Right atrial size was normal in size. Pericardium: There is no evidence of pericardial effusion. Mitral Valve: The mitral valve is normal in structure. Mild mitral annular calcification. No evidence of mitral valve regurgitation. No evidence of mitral valve stenosis. Tricuspid Valve: The tricuspid valve is normal in structure. Tricuspid valve regurgitation is not demonstrated. No evidence of tricuspid stenosis. Aortic Valve: The aortic valve is tricuspid. Aortic valve regurgitation is not visualized. No aortic stenosis is present. Aortic valve peak gradient measures 9.7 mmHg. Pulmonic Valve: The pulmonic valve was normal in structure. Pulmonic valve regurgitation is not visualized. No evidence of pulmonic stenosis. Aorta: Aortic dilatation noted. There is mild dilatation of the aortic root, measuring 40 mm. Venous: The inferior vena cava is normal in size with greater than 50% respiratory variability, suggesting right atrial pressure of 3 mmHg. IAS/Shunts: The interatrial septum appears to be lipomatous. No atrial level shunt detected by color flow Doppler.  LEFT VENTRICLE PLAX 2D LVIDd:         4.40 cm   Diastology LVIDs:         2.80 cm   LV e' medial:    10.10 cm/s LV PW:         1.10 cm   LV E/e' medial:  7.8 LV IVS:        1.00 cm   LV e' lateral:   16.90 cm/s LVOT diam:     2.40 cm   LV E/e' lateral:  4.7 LV SV:         70 LV SV Index:   29 LVOT Area:     4.52 cm  LEFT ATRIUM           Index        RIGHT ATRIUM           Index LA diam:      3.50 cm 1.48 cm/m   RA Area:     10.90 cm LA Vol (A2C): 41.4 ml 17.47 ml/m  RA Volume:   17.30 ml  7.30 ml/m LA Vol (A4C): 36.7 ml 15.49 ml/m  AORTIC VALVE AV Area (Vmax): 3.48 cm AV Vmax:        156.00 cm/s AV Peak Grad:   9.7 mmHg LVOT Vmax:      120.00 cm/s LVOT Vmean:     74.600 cm/s LVOT VTI:       0.154 m  AORTA Ao Root diam: 4.00 cm Ao Asc diam:  3.60 cm MITRAL VALVE MV Area (PHT): 5.02 cm    SHUNTS MV Decel Time: 151 msec    Systemic VTI:  0.15 m MV E velocity: 78.60 cm/s  Systemic Diam: 2.40 cm MV A velocity: 95.40 cm/s MV E/A ratio:  0.82 Stanly Leavens MD Electronically signed by Stanly Leavens MD Signature Date/Time: 04/19/2024/2:25:06 PM    Final    DG CHEST PORT 1 VIEW Result Date: 04/16/2024 CLINICAL DATA:  Leukocytosis EXAM: PORTABLE CHEST 1 VIEW COMPARISON:  None Available. FINDINGS: The heart size and mediastinal contours are within normal limits. Both lungs are clear. The visualized skeletal structures are unremarkable. IMPRESSION: No active disease. Electronically Signed   By: Luke Bun M.D.   On: 04/16/2024 15:39   VAS US  LOWER EXTREMITY VENOUS (DVT) Result Date: 04/16/2024  Lower Venous DVT Study Patient Name:  Roger Pace  Date of Exam:   04/16/2024 Medical Rec #: [id number]          Accession #:    [id number] Date of Birth: 07/25/1971          Patient Gender: M Patient Age:   3 years Exam Location:  [facility] Procedure:      VAS US  LOWER EXTREMITY VENOUS (DVT) Referring Phys: CARLIN CALIX --------------------------------------------------------------------------------  Indications: Swelling, and Edema.  Risk Factors: Surgery Bilat knee surgery 04/14/2024. Performing Technologist: Elmarie Lindau, RVT  Examination Guidelines: A complete evaluation includes B-mode imaging, spectral Doppler, color Doppler, and  power Doppler as needed of all accessible portions of each vessel. Bilateral testing is considered an integral part of a complete examination. Limited examinations for reoccurring indications may be performed as noted. The reflux portion of the exam is performed with the patient in reverse Trendelenburg.  +---------+---------------+---------+-----------+----------+--------------+ RIGHT    CompressibilityPhasicitySpontaneityPropertiesThrombus Aging +---------+---------------+---------+-----------+----------+--------------+ CFV      Full           Yes      Yes                                 +---------+---------------+---------+-----------+----------+--------------+  SFJ      Full                                                        +---------+---------------+---------+-----------+----------+--------------+ FV Prox  Full                                                        +---------+---------------+---------+-----------+----------+--------------+ FV Mid   Full                                                        +---------+---------------+---------+-----------+----------+--------------+ FV DistalFull                                                        +---------+---------------+---------+-----------+----------+--------------+ PFV      Full                                                        +---------+---------------+---------+-----------+----------+--------------+ POP      Full           Yes      Yes                                 +---------+---------------+---------+-----------+----------+--------------+ PTV      Full                                                        +---------+---------------+---------+-----------+----------+--------------+ PERO     None                                         Acute          +---------+---------------+---------+-----------+----------+--------------+    +---------+---------------+---------+-----------+----------+--------------+ LEFT     CompressibilityPhasicitySpontaneityPropertiesThrombus Aging +---------+---------------+---------+-----------+----------+--------------+ CFV      Full           Yes      Yes                                 +---------+---------------+---------+-----------+----------+--------------+ SFJ      Full                                                        +---------+---------------+---------+-----------+----------+--------------+  FV Prox  Full                                                        +---------+---------------+---------+-----------+----------+--------------+ FV Mid   Full                                                        +---------+---------------+---------+-----------+----------+--------------+ FV DistalFull                                                        +---------+---------------+---------+-----------+----------+--------------+ PFV      Full                                                        +---------+---------------+---------+-----------+----------+--------------+ POP      Full           Yes      Yes                                 +---------+---------------+---------+-----------+----------+--------------+ PTV      None                                         Acute          +---------+---------------+---------+-----------+----------+--------------+ PERO     Full                                                        +---------+---------------+---------+-----------+----------+--------------+     *See table(s) above for measurements and observations. Electronically signed by Gaile New MD on 04/16/2024 at 12:29:37 PM.    Final     Disposition:   Discharge Instructions     Call MD / Call 911   Complete by: As directed    If you experience chest pain or shortness of breath, CALL 911 and be transported to the hospital emergency  room.  If you develope a fever above 101 F, pus (white drainage) or increased drainage or redness at the wound, or calf pain, call your surgeon's office.   Constipation Prevention   Complete by: As directed    Drink plenty of fluids.  Prune juice may be helpful.  You may use a stool softener, such as Colace (over the counter) 100 mg twice a day.  Use MiraLax  (over the counter) for constipation as needed.   Diet - low sodium heart healthy   Complete by: As directed    Discharge instructions   Complete by: As directed    From Dr.  Dean 1.  Okay to shower dressings are waterproof 2.  CPM machine 1 hour 3 times a day each knee increasing the degrees daily 3.  Use the blue cradle boot for 15 to 20 minutes each knee before you do the CPM machine 4.  Use the knee immobilizer on one of your legs when you are walking in order to have full support when ambulating and using the walker 5.  Take the blood thinners as prescribed for at least 3 months.   Per [facility] clinic policy, our goal is ensure optimal postoperative pain control with a multimodal pain management strategy. For all [facility] patients, our goal is to wean post-operative narcotic medications by 6 weeks post-operatively. If this is not possible due to utilization of pain medication prior to surgery, your [facility] doctor will support your acute post-operative pain control for the first 6 weeks postoperatively, with a plan to transition you back to your primary pain team following that. Maralee will work to ensure a Therapist, occupational.   Increase activity slowly as tolerated   Complete by: As directed    Post-operative opioid taper instructions:   Complete by: As directed    POST-OPERATIVE OPIOID TAPER INSTRUCTIONS: It is important to wean off of your opioid medication as soon as possible. If you do not need pain medication after your surgery it is ok to stop day one. Opioids include: Codeine, Hydrocodone(Norco, Vicodin), Oxycodone (Percocet,  oxycontin ) and hydromorphone  amongst others.  Long term and even short term use of opiods can cause: Increased pain response Dependence Constipation Depression Respiratory depression And more.  Withdrawal symptoms can include Flu like symptoms Nausea, vomiting And more Techniques to manage these symptoms Hydrate well Eat regular healthy meals Stay active Use relaxation techniques(deep breathing, meditating, yoga) Do Not substitute Alcohol to help with tapering If you have been on opioids for less than two weeks and do not have pain than it is ok to stop all together.  Plan to wean off of opioids This plan should start within one week post op of your joint replacement. Maintain the same interval or time between taking each dose and first decrease the dose.  Cut the total daily intake of opioids by one tablet each day Next start to increase the time between doses. The last dose that should be eliminated is the evening dose.           Follow-up Information     Hilts, Ozell, MD Follow up.   Specialty: Family Medicine Contact information: [address] [place] [address] [phone]         [facility] Follow up.   Specialty: Home Health Services Why: home health services will be provided by [facility] information: [address] [place] [address] [phone]                  Signed: Herlene Calix 04/27/2024, 10:16 AM

## 2024-04-27 NOTE — Plan of Care (Signed)
  Problem: Consults Goal: RH GENERAL PATIENT EDUCATION Description: See Patient Education module for education specifics. Outcome: Progressing   Problem: RH BOWEL ELIMINATION Goal: RH STG MANAGE BOWEL WITH ASSISTANCE Description: STG Manage Bowel with mod I Assistance. Outcome: Progressing   Problem: RH BLADDER ELIMINATION Goal: RH STG MANAGE BLADDER WITH ASSISTANCE Description: STG Manage Bladder With  mod I Assistance Outcome: Progressing   Problem: RH SKIN INTEGRITY Goal: RH STG SKIN FREE OF INFECTION/BREAKDOWN Description: Manage skin free of infection / breakdown with mod I assistance Outcome: Progressing   Problem: RH SAFETY Goal: RH STG ADHERE TO SAFETY PRECAUTIONS W/ASSISTANCE/DEVICE Description: STG Adhere to Safety Precautions With mod I  Assistance/Device. Outcome: Progressing   Problem: RH PAIN MANAGEMENT Goal: RH STG PAIN MANAGED AT OR BELOW PT'S PAIN GOAL Description: <4 w/ prns Outcome: Progressing   Problem: RH KNOWLEDGE DEFICIT GENERAL Goal: RH STG INCREASE KNOWLEDGE OF SELF CARE AFTER HOSPITALIZATION Description: Manage increase knowledge of self care after hospitalization with mod I assistance from wife using educational materials provided  Outcome: Progressing

## 2024-04-27 NOTE — Progress Notes (Signed)
 Occupational Therapy Session Note  Patient Details  Name: Roger Pace MRN: [id number] Date of Birth: 1971-02-25  Today's Date: 04/27/2024 OT Individual Time: 0735-0826+1053-1207 OT Individual Time Calculation (min): 51 min    Short Term Goals: Week 1:  OT Short Term Goal 1 (Week 1): patient will complete sit to stand for LB ADLs with mod A OT Short Term Goal 2 (Week 1): patient will complete toileting 3/3 with mod A OT Short Term Goal 3 (Week 1): patient will demo ability to complete toilet transfers with mod A OT Short Term Goal 4 (Week 1): patient will demo ability to complete LB bathing with mod A  Skilled Therapeutic Interventions/Progress Updates:  Session 1: Pt greeted supine, pt agreeable to OT intervention.   Unrated pain reported in bil knees. Rest breaks provided as needed.    Transfers/bed mobility/functional mobility: pt completed bed mobility with CGA. Pt completed lateral scoot transfer with CGA to stabilize w/c.  Worked on sit>stands in gym from 22 inch mat and with MODA. Lowered mat to 21 inches as pt reports low/soft furniture at home. Pt required MODA to rise in to standing.  Pt completed short distance functional ambulation in gym ~ 10 ft with RW and CGA.     Exercises: worked on knee flexion with washcloth placed under each BLE to decrease fricittion and facilitate active assist ROM. Pt able to achieve 66* of knee flexion on RLE and 61* of knee flexion on LLE.   Of note retrieved [facility] over toilet to provide more support during toilet transfers. Pt also reports fatigue when ambulating in/out of bathroom, recommended pt have nursing bring BSC to bed side for energy conservation. Pt agreeable to attempt.                 Ended session with pt seated in w/c with all needs within reach.       Session 2: Pt greeted seated in w/c, pt agreeable to OT intervention.     Unrated pain reported in bil knees, rest breaks provided as needed.  Transfers/bed  mobility/functional mobility:  Pt completed sit>stands with heavy MODA. Pt completed 382 ft of functional ambulation with RW and CGA with w/c follow. Pt needed 2 standing rest breaks d/t fatigue.     Education: discussed options for getting in shower at home. Pt reports walkin and tub shower, educated pt on various transfer methods to both.     Exercises:  Pt completed Standing toe taps to yoga block at 2 lowest heights for 5 reps each as precursor to shower transfer and to improve standing balance for LB ADLS.  Pt completed seated leg lifts over 1 cone to simulate elevating BLEs over tub from TTB. Pt completed 5 reps each side. Increased difficulty on L side. Graded task up and had pt elevate leg over height of 3 cones. Emphasis on improved knee flexion in functional context.   Pt completed 5 mins on kinetron for LB strengthening at 25 lbs and to maintain consistent knee flexion.  Ended session with pt supine in bed with all needs within reach and bed alarm activated.                    Therapy Documentation Precautions:  Precautions Precautions: Fall, Knee Recall of Precautions/Restrictions: Intact Precaution/Restrictions Comments: Pt educated on need for knee extension at this time. Bed locked in extension, and education provided that NO pillow/roll/ice pack should be placed under the knee. Required Braces or Orthoses:  Knee Immobilizer - Left Knee Immobilizer - Right: Other (comment) (Dr. Addie, try to alternate knee immobilizer between legs to allow each leg the chance to bear more of the load when mobilizing.) Knee Immobilizer - Left: Other (comment) (Dr. Addie, try to alternate knee immobilizer between legs to allow each leg the chance to bear more of the load when mobilizing.) Restrictions Weight Bearing Restrictions Per Provider Order: Yes RLE Weight Bearing Per Provider Order: Weight bearing as tolerated LLE Weight Bearing Per Provider Order: Weight bearing as  tolerated    Therapy/Group: Individual Therapy  Ronal Gift [facility] 04/27/2024, 12:09 PM

## 2024-04-27 NOTE — Progress Notes (Signed)
 PROGRESS NOTE   Subjective/Complaints: No new complaints or concerns.  Reports pain with therapy but overall under control.  Denies recent fevers.  ROS: Patient denies fever, new vision changes, dizziness, nausea, vomiting, diarrhea,  shortness of breath or chest pain, headache, or mood change.   Objective:   No results found.  Recent Labs    04/26/24 0453 04/27/24 0512  WBC 14.1* 11.8*  HGB 7.3* 7.7*  HCT 22.7* 24.1*  PLT 655* 724*   Recent Labs    04/25/24 0433 04/27/24 0512  NA 134* 133*  K 4.3 4.5  CL 101 101  CO2 20* 24  GLUCOSE 115* 113*  BUN 15 15  CREATININE 1.14 1.03  CALCIUM  8.7* 8.8*    Intake/Output Summary (Last 24 hours) at 04/27/2024 1104 Last data filed at 04/26/2024 2100 Gross per 24 hour  Intake 920 ml  Output 575 ml  Net 345 ml        Physical Exam: Vital Signs Blood pressure 122/71, pulse 99, temperature 98.8 F (37.1 C), resp. rate 19, height 5' 11.5 (1.816 m), weight 118 kg, SpO2 97%.  Constitutional: No distress . Vital signs reviewed.  Working in the gym with therapy  HEENT: NCAT, EOMI, oral membranes a little dry Neck: supple Cardiovascular: RRR without murmur. No JVD    Respiratory/Chest: CTA Bilaterally without wheezes or rales. Normal effort    GI/Abdomen: BS +, non-tender, non-distended Ext: no clubbing, cyanosis, or edema Psych: pleasant and cooperative  Skin: Clean and intact without signs of breakdown. Knee incisions appear intact with post-op dressings in place. Neuro:      Mental Status: AAOx3, good insight and awareness Speech/Languate: Follows commands CRANIAL NERVES: 2-12 grossly intact   MOTOR: RUE: 5/5 Deltoid, 5/5 Biceps, 5/5 Triceps,5/5 Grip LUE: 5/5 Deltoid, 5/5 Biceps, 5/5 Triceps, 5/5 Grip RLE: HF 2/5, KE 2/5, ADF 4/5, APF 4/5 LLE: HF 2/5, KE 2/5, ADF 4/5, APF 4/5   SENSORY: Normal to touch all 4 extremities  MSK: both knees somewhat sensitive to  palpation   Prior neuro assessment is c/w today's exam 04/27/2024.   Assessment/Plan: 1. Functional deficits which require 3+ hours per day of interdisciplinary therapy in a comprehensive inpatient rehab setting. Physiatrist is providing close team supervision and 24 hour management of active medical problems listed below. Physiatrist and rehab team continue to assess barriers to discharge/monitor patient progress toward functional and medical goals  Care Tool:  Bathing    Body parts bathed by patient: Right arm, Left arm, Chest, Abdomen, Front perineal area, Right upper leg, Left upper leg   Body parts bathed by helper: Left lower leg, Right lower leg, Buttocks     Bathing assist Assist Level: Maximal Assistance - Patient 24 - 49%     Upper Body Dressing/Undressing Upper body dressing        Upper body assist Assist Level: Set up assist    Lower Body Dressing/Undressing Lower body dressing            Lower body assist Assist for lower body dressing: Maximal Assistance - Patient 25 - 49%     Toileting Toileting    Toileting assist Assist for toileting: Maximal Assistance -  Patient 25 - 49%     Transfers Chair/bed transfer  Transfers assist     Chair/bed transfer assist level: Minimal Assistance - Patient > 75%     Locomotion Ambulation   Ambulation assist      Assist level: Minimal Assistance - Patient > 75% Assistive device: Walker-rolling Max distance: 72ft   Walk 10 feet activity   Assist     Assist level: Minimal Assistance - Patient > 75% Assistive device: Walker-rolling   Walk 50 feet activity   Assist Walk 50 feet with 2 turns activity did not occur: Safety/medical concerns (fatigue/pain)  Assist level: Minimal Assistance - Patient > 75% Assistive device: Walker-rolling    Walk 150 feet activity   Assist Walk 150 feet activity did not occur: Safety/medical concerns         Walk 10 feet on uneven surface   activity   Assist Walk 10 feet on uneven surfaces activity did not occur: Safety/medical concerns         Wheelchair     Assist Is the patient using a wheelchair?: Yes Type of Wheelchair: Manual    Wheelchair assist level: Supervision/Verbal cueing Max wheelchair distance: 150    Wheelchair 50 feet with 2 turns activity    Assist        Assist Level: Supervision/Verbal cueing   Wheelchair 150 feet activity     Assist      Assist Level: Supervision/Verbal cueing   Blood pressure 122/71, pulse 99, temperature 98.8 F (37.1 C), resp. rate 19, height 5' 11.5 (1.816 m), weight 118 kg, SpO2 97%.  Medical Problem List and Plan: 1. Functional deficits secondary to B/L knee OA s/p b/l TKA complicated by acute PE             -patient may shower, cover incisions please             -ELOS/Goals: 12-15 days, PT/OT Mod I             -Discussed with Dr. Addie, appreciate assistance, pt to alternate knee immobilizers between legs, he can DC this when he can do 10 straight leg raises with each leg   -continue CPM  -Continue CIR therapies including PT, OT. Encouraged pt to push himself more on the CPM. He's only around 60 degrees currently   - Discussed plan for team meeting on Wednesday and will likely determine expected discharge date at that time 2.  Acute DVT/PE/Antithrombotics: -DVT/anticoagulation:  Pharmaceutical: Xarelto              -antiplatelet therapy: N/A 3. Pain Management: oxycodone  and robaxin  prn.  -appears controlled 4. Mood/Behavior/Sleep: LCSW to follow for evaluation and support.              -antipsychotic agents: N/A 5. Neuropsych/cognition: This patient is capable of making decisions on his own behalf. 6. Skin/Wound Care: Routine pressure relief measures. --Surgical dressings intact on both knees.  -9/7 buttock tenderness. I didn't see any signs of breakdown or pressure. Will continue to monitor, encouraged up to chair as much as possible. Nursing  to follow up 7. Fluids/Electrolytes/Nutrition: Monitor I/O. Check CMET in am  8. 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 Addie 9. BP/Orthostatic hypotension: BP improving. Continue to monitor.  10.  Hyponatremia/Hypochloremia: Improving with improvement in intake? -9/4 hyponatremia improved to 134, potassium stable at 4.5 -9/8 sodium 133 today, potassium 4.5, continue to monitor trend 11. ABLA: Recheck CBC in am. HGB 11.5 on 8/29  - 9/3 Hemoglobin down  to 7.3, recheck today, FOBT.  Will recheck tomorrow to ensure it stable  -9/5 FOBT negative, hemoglobin a little improved.  Continue to monitor daily for now.  Xarelto  continued for now  9/7-sl dip this weekend (7.3), but he was at similar numbers mid last week   -continue to monitor serially  9/8 hemoglobin stable at 7.7 continue to follow 12.  Low grade fevers: T-max 100.3 yesterday evening.  --Continue to monitor for signs of infection. CXR and UA neg --BC X 2  08/30 -->negative/pending.  -9/3 WBC a little improved to 12.7 -9/4 recheck chest x-ray, lactic acid, procalcitonin, urinalysis, blood cultures -9/5 lactic acid 2.2 initially yesterday but improved to 1.6, had fluid bolus.  Blood cultures with no growth, urine does not indicate infection, chest x-ray without acute changes 9/7 WBC's still 14k. UA negative. Wounds look ok. CXR NAD  -BCx NG x2d   -monitor only for now  9/8 WBC improved today to 11.8, not having significant fevers.  Continue to monitor 13. Constipation: Up all night after mag citrate.    -Continue Miralax  decrease to daily, senokot  -Dulcolax supp  PRN  -9/4 LBM today a little loose, decrease Senokot from twice daily to at bedtime -9/8 LBM yesterday 15. Impaired fasting glucose:  Check AIc in am. A1C 4.9 16. Orthostatic hypotension  - Encourage fluid intake, order abdominal binder      04/27/2024    4:17 AM 04/26/2024    7:34 PM 04/26/2024    3:39 PM  Vitals with BMI  Systolic 122 126 867  Diastolic 71 64 74   Pulse 99 106 101   17.  Transaminitis.  Has history of chronic mild elevation.  Continue to monitor  - 9/8 AST and ALT a little decreased today from last measurement 18.  Thrombocytosis.  Likely reactive.  Continue to monitor      LOS: 5 days A FACE TO FACE EVALUATION WAS PERFORMED  Murray Collier 04/27/2024, 11:04 AM

## 2024-04-28 DIAGNOSIS — M67971 Unspecified disorder of synovium and tendon, right ankle and foot: Secondary | ICD-10-CM

## 2024-04-28 LAB — CULTURE, BLOOD (ROUTINE X 2)
Culture: NO GROWTH
Culture: NO GROWTH
Special Requests: ADEQUATE
Special Requests: ADEQUATE

## 2024-04-28 MED ORDER — DICLOFENAC SODIUM 1 % EX GEL
2.0000 g | Freq: Four times a day (QID) | CUTANEOUS | Status: DC
  Administered 2024-04-28 – 2024-05-01 (×11): 2 g via TOPICAL
  Filled 2024-04-28: qty 100

## 2024-04-28 MED ORDER — POLYETHYLENE GLYCOL 3350 17 G PO PACK: 17.0000 g | PACK | Freq: Every day | ORAL | Status: DC | PRN

## 2024-04-28 NOTE — Plan of Care (Signed)
  Problem: Consults Goal: RH GENERAL PATIENT EDUCATION Description: See Patient Education module for education specifics. Outcome: Progressing   Problem: RH BOWEL ELIMINATION Goal: RH STG MANAGE BOWEL WITH ASSISTANCE Description: STG Manage Bowel with mod I Assistance. Outcome: Progressing   Problem: RH BLADDER ELIMINATION Goal: RH STG MANAGE BLADDER WITH ASSISTANCE Description: STG Manage Bladder With  mod I Assistance Outcome: Progressing   Problem: RH SKIN INTEGRITY Goal: RH STG SKIN FREE OF INFECTION/BREAKDOWN Description: Manage skin free of infection / breakdown with mod I assistance Outcome: Progressing   Problem: RH SAFETY Goal: RH STG ADHERE TO SAFETY PRECAUTIONS W/ASSISTANCE/DEVICE Description: STG Adhere to Safety Precautions With mod I  Assistance/Device. Outcome: Progressing   Problem: RH PAIN MANAGEMENT Goal: RH STG PAIN MANAGED AT OR BELOW PT'S PAIN GOAL Description: <4 w/ prns Outcome: Progressing   Problem: RH KNOWLEDGE DEFICIT GENERAL Goal: RH STG INCREASE KNOWLEDGE OF SELF CARE AFTER HOSPITALIZATION Description: Manage increase knowledge of self care after hospitalization with mod I assistance from wife using educational materials provided  Outcome: Progressing

## 2024-04-28 NOTE — Progress Notes (Signed)
 Occupational Therapy Session Note  Patient Details  Name: KEISHAUN HAZEL MRN: [id number] Date of Birth: [birth year]  Today's Date: 04/28/2024 OT Individual Time: 8951-8789 OT Individual Time Calculation (min): 82 min    Short Term Goals: Week 1:  OT Short Term Goal 1 (Week 1): patient will complete sit to stand for LB ADLs with mod A OT Short Term Goal 2 (Week 1): patient will complete toileting 3/3 with mod A OT Short Term Goal 3 (Week 1): patient will demo ability to complete toilet transfers with mod A OT Short Term Goal 4 (Week 1): patient will demo ability to complete LB bathing with mod A  Skilled Therapeutic Interventions/Progress Updates:  Patient agreeable to participate in OT session. Reports 6/10 pain level, premedicated.   Patient participated in skilled OT session focusing on functional mobility, LE strengthening and ROM.  Patient found in wc. Completed sit to stand mod A. Completed functional mobility to the gym approx 200 ft. Completed leg lifts over cone to increase ability to complete tub/ toilet transfers. Completed ROM for therapeutic exercise for knee extension and knee flexion utilizing sliding material to increase ROM to approx 70 degrees on bilateral knee flexion. Required several rest breaks. Completed functional mobility back to room, bed mobility CGA. All needs met.     Therapy Documentation Precautions:  Precautions Precautions: Fall, Knee Recall of Precautions/Restrictions: Intact Precaution/Restrictions Comments: Pt educated on need for knee extension at this time. Bed locked in extension, and education provided that NO pillow/roll/ice pack should be placed under the knee. Required Braces or Orthoses: Knee Immobilizer - Left Knee Immobilizer - Right: Other (comment) (Dr. Addie, try to alternate knee immobilizer between legs to allow each leg the chance to bear more of the load when mobilizing.) Knee Immobilizer - Left: Other (comment) (Dr. Addie, try to  alternate knee immobilizer between legs to allow each leg the chance to bear more of the load when mobilizing.) Restrictions Weight Bearing Restrictions Per Provider Order: Yes RLE Weight Bearing Per Provider Order: Weight bearing as tolerated LLE Weight Bearing Per Provider Order: Weight bearing as tolerated   Therapy/Group: Individual Therapy  D'mariea L Iyanna Drummer 04/28/2024, 6:47 AM

## 2024-04-28 NOTE — Progress Notes (Signed)
 PROGRESS NOTE   Subjective/Complaints: No new complaints or concerns.  Seen by Dr. Addie yesterday, recommending to work on more flexion of his knees.  ROS: Patient denies fever, new vision changes, dizziness, nausea, vomiting,   shortness of breath or chest pain, headache, or mood change. + loose Bms + Achilles pain right-chronic issue for him   Objective:   No results found.  Recent Labs    04/26/24 0453 04/27/24 0512  WBC 14.1* 11.8*  HGB 7.3* 7.7*  HCT 22.7* 24.1*  PLT 655* 724*   Recent Labs    04/27/24 0512  NA 133*  K 4.5  CL 101  CO2 24  GLUCOSE 113*  BUN 15  CREATININE 1.03  CALCIUM  8.8*    Intake/Output Summary (Last 24 hours) at 04/28/2024 1445 Last data filed at 04/28/2024 1353 Gross per 24 hour  Intake 956 ml  Output 1450 ml  Net -494 ml        Physical Exam: Vital Signs Blood pressure 135/67, pulse (!) 109, temperature 99.2 F (37.3 C), temperature source Oral, resp. rate 20, height 5' 11.5 (1.816 m), weight 118 kg, SpO2 100%.  Constitutional: No distress . Vital signs reviewed.  Sitting in wheelchair in his room HEENT: NCAT, EOMI, oral membranes moist Neck: supple Cardiovascular: RRR without murmur. No JVD    Respiratory/Chest: CTA Bilaterally without wheezes or rales. Normal effort    GI/Abdomen: BS +, non-tender, non-distended, soft Ext: no clubbing, cyanosis, or edema Psych: pleasant and cooperative  Skin: Clean and intact without signs of breakdown. Knee incisions appear intact with post-op dressings in place. Neuro:      Mental Status: AAOx3, good insight and awareness Speech/Languate: Follows commands CRANIAL NERVES: 2-12 grossly intact   MOTOR: RUE: 5/5 Deltoid, 5/5 Biceps, 5/5 Triceps,5/5 Grip LUE: 5/5 Deltoid, 5/5 Biceps, 5/5 Triceps, 5/5 Grip RLE: HF 2/5, KE 2/5, ADF 4/5, APF 4/5 LLE: HF 2/5, KE 2/5, ADF 4/5, APF 4/5   SENSORY: Normal to touch all 4  extremities  MSK: both knees somewhat sensitive to palpation Right Achilles tenderness palpation   Prior neuro assessment is c/w today's exam 04/28/2024.   Assessment/Plan: 1. Functional deficits which require 3+ hours per day of interdisciplinary therapy in a comprehensive inpatient rehab setting. Physiatrist is providing close team supervision and 24 hour management of active medical problems listed below. Physiatrist and rehab team continue to assess barriers to discharge/monitor patient progress toward functional and medical goals  Care Tool:  Bathing    Body parts bathed by patient: Right arm, Left arm, Chest, Abdomen, Front perineal area, Right upper leg, Left upper leg   Body parts bathed by helper: Left lower leg, Right lower leg, Buttocks     Bathing assist Assist Level: Maximal Assistance - Patient 24 - 49%     Upper Body Dressing/Undressing Upper body dressing        Upper body assist Assist Level: Set up assist    Lower Body Dressing/Undressing Lower body dressing            Lower body assist Assist for lower body dressing: Maximal Assistance - Patient 25 - 49%     Toileting Toileting  Toileting assist Assist for toileting: Maximal Assistance - Patient 25 - 49%     Transfers Chair/bed transfer  Transfers assist     Chair/bed transfer assist level: Contact Guard/Touching assist     Locomotion Ambulation   Ambulation assist      Assist level: Minimal Assistance - Patient > 75% Assistive device: Walker-rolling Max distance: 83ft   Walk 10 feet activity   Assist     Assist level: Minimal Assistance - Patient > 75% Assistive device: Walker-rolling   Walk 50 feet activity   Assist Walk 50 feet with 2 turns activity did not occur: Safety/medical concerns (fatigue/pain)  Assist level: Minimal Assistance - Patient > 75% Assistive device: Walker-rolling    Walk 150 feet activity   Assist Walk 150 feet activity did not occur:  Safety/medical concerns         Walk 10 feet on uneven surface  activity   Assist Walk 10 feet on uneven surfaces activity did not occur: Safety/medical concerns         Wheelchair     Assist Is the patient using a wheelchair?: Yes Type of Wheelchair: Manual    Wheelchair assist level: Supervision/Verbal cueing Max wheelchair distance: 150    Wheelchair 50 feet with 2 turns activity    Assist        Assist Level: Supervision/Verbal cueing   Wheelchair 150 feet activity     Assist      Assist Level: Supervision/Verbal cueing   Blood pressure 135/67, pulse (!) 109, temperature 99.2 F (37.3 C), temperature source Oral, resp. rate 20, height 5' 11.5 (1.816 m), weight 118 kg, SpO2 100%.  Medical Problem List and Plan: 1. Functional deficits secondary to B/L knee OA s/p b/l TKA complicated by acute PE             -patient may shower, cover incisions please             -ELOS/Goals: 12-15 days, PT/OT Mod I             -Discussed with Dr. Addie, appreciate assistance, pt to alternate knee immobilizers between legs, he can DC this when he can do 10 straight leg raises with each leg   -continue CPM  -Continue CIR therapies including PT, OT. Encouraged pt to push himself more on the CPM. He's only around 60 degrees currently   - Discussed plan for team meeting on Wednesday and will likely determine expected discharge date at that time-team conference tomorrow 2.  Acute DVT/PE/Antithrombotics: -DVT/anticoagulation:  Pharmaceutical: Xarelto              -antiplatelet therapy: N/A 3. Pain Management: oxycodone  and robaxin  prn.  -appears controlled  - 9/9 right Achilles pain- Voltaren  and heat 4. Mood/Behavior/Sleep: LCSW to follow for evaluation and support.              -antipsychotic agents: N/A 5. Neuropsych/cognition: This patient is capable of making decisions on his own behalf. 6. Skin/Wound Care: Routine pressure relief measures. --Surgical dressings  intact on both knees.  -9/7 buttock tenderness. I didn't see any signs of breakdown or pressure. Will continue to monitor, encouraged up to chair as much as possible. Nursing to follow up 7. Fluids/Electrolytes/Nutrition: Monitor I/O. Check CMET in am  8. 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 Addie 9. BP/Orthostatic hypotension: BP improving. Continue to monitor.  10.  Hyponatremia/Hypochloremia: Improving with improvement in intake? -9/4 hyponatremia improved to 134, potassium stable at 4.5 -9/8 sodium 133 today, potassium  4.5, continue to monitor trend Recheck tomorrow 11. ABLA: Recheck CBC in am. HGB 11.5 on 8/29  - 9/3 Hemoglobin down to 7.3, recheck today, FOBT.  Will recheck tomorrow to ensure it stable  -9/5 FOBT negative, hemoglobin a little improved.  Continue to monitor daily for now.  Xarelto  continued for now  9/7-sl dip this weekend (7.3), but he was at similar numbers mid last week   -continue to monitor serially  9/8 hemoglobin stable at 7.7 continue to follow  Recheck tomorrow 12.  Low grade fevers: T-max 100.3 yesterday evening.  --Continue to monitor for signs of infection. CXR and UA neg --BC X 2  08/30 -->negative/pending.  -9/3 WBC a little improved to 12.7 -9/4 recheck chest x-ray, lactic acid, procalcitonin, urinalysis, blood cultures -9/5 lactic acid 2.2 initially yesterday but improved to 1.6, had fluid bolus.  Blood cultures with no growth, urine does not indicate infection, chest x-ray without acute changes 9/7 WBC's still 14k. UA negative. Wounds look ok. CXR NAD  -BCx NG x2d   -monitor only for now  9/8 WBC improved today to 11.8, not having significant fevers.  Continue to monitor  9/9 no significant fevers in the last 24 hours, recheck CBC tomorrow 13. Constipation: Up all night after mag citrate.    -Continue Miralax  decrease to daily, senokot  -Dulcolax supp  PRN  -9/4 LBM today a little loose, decrease Senokot from twice daily to at bedtime -9/9  patient reports loose bowel movements, will discontinue Senokot and change MiraLAX  to as needed 15. Impaired fasting glucose:  Check AIc in am. A1C 4.9 16. Orthostatic hypotension  - Encourage fluid intake, order abdominal binder      04/28/2024    1:44 PM 04/28/2024    5:20 AM 04/27/2024    8:00 PM  Vitals with BMI  Systolic 135 114 862  Diastolic 67 58 65  Pulse 109 91 107   17.  Transaminitis.  Has history of chronic mild elevation.  Continue to monitor  - 9/8 AST and ALT a little decreased today from last measurement 18.  Thrombocytosis.  Likely reactive.  Continue to monitor      LOS: 6 days A FACE TO FACE EVALUATION WAS PERFORMED  Murray Collier 04/28/2024, 2:45 PM

## 2024-04-28 NOTE — Progress Notes (Signed)
 Physical Therapy Session Note  Patient Details  Name: Roger Pace MRN: [id number] Date of Birth: 1970-10-17  Today's Date: 04/28/2024 PT Individual Time: 0800-0900, 8668-8571 PT Individual Time Calculation (min): 60 min, 57 min  Short Term Goals: Week 1:  PT Short Term Goal 1 (Week 1): pt will perform sit to stand with RW and min A with LRAD PT Short Term Goal 2 (Week 1): pt will ambulate 100 feet with LRAD and min A PT Short Term Goal 3 (Week 1): pt will demonstrate ability to actively extend [name] to -5 deg or less to reduce dependence on knee immobilizer  Skilled Therapeutic Interventions/Progress Updates:      Treatment Session 1   Pt long sitting in bed upon arrival stretching R calf with gait belt, pt endorses 8/10 [name] achilles tendon pain post use of CPM machine. Premedicated. Pt endorses previous achielles sports injury at baseline. PT provided rest breaks/repositioning/active stretching/ice at end of session. Pt endorses pain decreasing with mobility.   Pt ambulated room <>ortho gym <> with RW and close supervision, with heavy UE support on RW, verbal cues provided for upright posture/forward gaze and reduced reliance on RW.   Pt completed 10 min on nu step with therapist progressively moving chair forward for improved knee flexion ROM (seat initally at 15, progressed to 13).   Sit to stand with light min A/CGA, with therapist encouraging pt to bend [name] as much as possible   Pt seated in WC at end of session with all needs within reach and bed alarm on.   Treatment Session 2  Pt supine in bed upon arrival. Pt agreeable to therapy. Pt reports 8/10 [name] achilles pain, and 4/10 [name] pain. PT donned MHP to [name] while pt performed [name] calf stretch with use of gait belt and ankle pumps. Nurse present to administer pain medication. Pt endorses pain decreased to 5/10 with heat and therex.   Pt performed the following therex for [name] ROM/strengthening:   1x10 AA LAQ  B - pt demos -20 knee extension lag actively   1x10 AA seated quad sets with LE propped up on therapist leg-pt demonstrates improved activation with pt achieve -5 deg extension    Pt self propelled WC with B UE to facilitate improved knee flexion/extension activation. Pt required mod A for initiation progressing to supervision with +++ time.   Pt seated EOB at end of session with all needs within reach and bed alarm on.           Therapy Documentation Precautions:  Precautions Precautions: Fall, Knee Recall of Precautions/Restrictions: Intact Precaution/Restrictions Comments: Pt educated on need for knee extension at this time. Bed locked in extension, and education provided that NO pillow/roll/ice pack should be placed under the knee. Required Braces or Orthoses: Knee Immobilizer - Left Knee Immobilizer - Right: Other (comment) (Dr. Addie, try to alternate knee immobilizer between legs to allow each leg the chance to bear more of the load when mobilizing.) Knee Immobilizer - Left: Other (comment) (Dr. Addie, try to alternate knee immobilizer between legs to allow each leg the chance to bear more of the load when mobilizing.) Restrictions Weight Bearing Restrictions Per Provider Order: Yes RLE Weight Bearing Per Provider Order: Weight bearing as tolerated LLE Weight Bearing Per Provider Order: Weight bearing as tolerated  Therapy/Group: Individual Therapy  [facility] Doreene Orris, [place], DPT  04/28/2024, 7:51 AM

## 2024-04-28 NOTE — Progress Notes (Signed)
 Patient ID: Roger Pace, male   DOB: 08-17-1971, 53 y.o.   MRN: [id number] Met with the patient to review current medical situation, rehab process, team conference and plan of care. Discussed pain management, CPM use (only up to 60 degrees flexion on machine); has two machines at home, and DVT prophylaxis.  Wearing TTEDs with steri strips to incisions. Continue to follow along to address educational needs to facilitate preparation for discharge. [name]

## 2024-04-29 ENCOUNTER — Encounter: Admitting: Orthopedic Surgery

## 2024-04-29 DIAGNOSIS — M25562 Pain in left knee: Secondary | ICD-10-CM

## 2024-04-29 DIAGNOSIS — M25561 Pain in right knee: Secondary | ICD-10-CM

## 2024-04-29 DIAGNOSIS — G8929 Other chronic pain: Secondary | ICD-10-CM

## 2024-04-29 LAB — CBC
HCT: 26.4 % — ABNORMAL LOW (ref 39.0–52.0)
Hemoglobin: 8.4 g/dL — ABNORMAL LOW (ref 13.0–17.0)
MCH: 31 pg (ref 26.0–34.0)
MCHC: 31.8 g/dL (ref 30.0–36.0)
MCV: 97.4 fL (ref 80.0–100.0)
Platelets: 702 K/uL — ABNORMAL HIGH (ref 150–400)
RBC: 2.71 MIL/uL — ABNORMAL LOW (ref 4.22–5.81)
RDW: 15.3 % (ref 11.5–15.5)
WBC: 10.3 K/uL (ref 4.0–10.5)
nRBC: 0.2 % (ref 0.0–0.2)

## 2024-04-29 LAB — COMPREHENSIVE METABOLIC PANEL WITH GFR
ALT: 63 U/L — ABNORMAL HIGH (ref 0–44)
AST: 73 U/L — ABNORMAL HIGH (ref 15–41)
Albumin: 3 g/dL — ABNORMAL LOW (ref 3.5–5.0)
Alkaline Phosphatase: 79 U/L (ref 38–126)
Anion gap: 11 (ref 5–15)
BUN: 20 mg/dL (ref 6–20)
CO2: 21 mmol/L — ABNORMAL LOW (ref 22–32)
Calcium: 8.8 mg/dL — ABNORMAL LOW (ref 8.9–10.3)
Chloride: 101 mmol/L (ref 98–111)
Creatinine, Ser: 1.14 mg/dL (ref 0.61–1.24)
GFR, Estimated: >60 mL/min (ref >60)
Glucose, Bld: 116 mg/dL — ABNORMAL HIGH (ref 70–99)
Potassium: 4 mmol/L (ref 3.5–5.1)
Sodium: 133 mmol/L — ABNORMAL LOW (ref 135–145)
Total Bilirubin: 0.9 mg/dL (ref 0.0–1.2)
Total Protein: 7.2 g/dL (ref 6.5–8.1)

## 2024-04-29 NOTE — Progress Notes (Signed)
 Occupational Therapy Weekly Progress Note  Patient Details  Name: Roger Pace MRN: [id number] Date of Birth: Jul 30, 1971  Beginning of progress report period: April 23, 2024 End of progress report period: April 29, 2024  Today's Date: 04/29/2024 OT Individual Time: 8951-8798 OT Individual Time Calculation (min): 73 min    Patient has met 4 of 4 short term goals.  Patient has  Patient continues to demonstrate the following deficits: muscle weakness, decreased cardiorespiratoy endurance, and decreased standing balance and decreased balance strategies and therefore will continue to benefit from skilled OT intervention to enhance overall performance with BADL.  Patient progressing toward long term goals..  Continue plan of care.  OT Short Term Goals Week 2:  OT Short Term Goal 1 (Week 2): STG=LTG d/t Elos  Skilled Therapeutic Interventions/Progress Updates:    Patient agreeable to participate in OT session. Reports 6/10 pain level.   Patient participated in skilled OT session focusing on functional mobility, ROM, LE strengthening, and straight leg raises. Patient completed  functional mobility to day room from room, 300+ ft. Completed kinetron to increase ROM and LE strength. Patient able to achieve 75 degrees of flexion and increase LE strength on B/L Legs. Completed lateral transfer to mat table. Completed leg slides to increase ROM. Completed leg lifts with light decrease extensor leg with straight leg raises. Able to complete 2x5 on Both legs with hold at top of leg with increased rest following. Patient returned to room all needs in reach.    Therapy Documentation Precautions:  Precautions Precautions: Fall, Knee Recall of Precautions/Restrictions: Intact Precaution/Restrictions Comments: Pt educated on need for knee extension at this time. Bed locked in extension, and education provided that NO pillow/roll/ice pack should be placed under the knee. Required Braces or  Orthoses: Knee Immobilizer - Left Knee Immobilizer - Right: Other (comment) (Dr. Addie, try to alternate knee immobilizer between legs to allow each leg the chance to bear more of the load when mobilizing.) Knee Immobilizer - Left: Other (comment) (Dr. Addie, try to alternate knee immobilizer between legs to allow each leg the chance to bear more of the load when mobilizing.) Restrictions Weight Bearing Restrictions Per Provider Order: Yes RLE Weight Bearing Per Provider Order: Weight bearing as tolerated LLE Weight Bearing Per Provider Order: Weight bearing as tolerated  Therapy/Group: Individual Therapy  [name] 04/29/2024, 7:28 AM

## 2024-04-29 NOTE — Plan of Care (Signed)
  Problem: Consults Goal: RH GENERAL PATIENT EDUCATION Description: See Patient Education module for education specifics. Outcome: Progressing   Problem: RH BOWEL ELIMINATION Goal: RH STG MANAGE BOWEL WITH ASSISTANCE Description: STG Manage Bowel with mod I Assistance. Outcome: Progressing   Problem: RH BLADDER ELIMINATION Goal: RH STG MANAGE BLADDER WITH ASSISTANCE Description: STG Manage Bladder With  mod I Assistance Outcome: Progressing   Problem: RH SKIN INTEGRITY Goal: RH STG SKIN FREE OF INFECTION/BREAKDOWN Description: Manage skin free of infection / breakdown with mod I assistance Outcome: Progressing   Problem: RH SAFETY Goal: RH STG ADHERE TO SAFETY PRECAUTIONS W/ASSISTANCE/DEVICE Description: STG Adhere to Safety Precautions With mod I  Assistance/Device. Outcome: Progressing   Problem: RH PAIN MANAGEMENT Goal: RH STG PAIN MANAGED AT OR BELOW PT'S PAIN GOAL Description: <4 w/ prns Outcome: Progressing   Problem: RH KNOWLEDGE DEFICIT GENERAL Goal: RH STG INCREASE KNOWLEDGE OF SELF CARE AFTER HOSPITALIZATION Description: Manage increase knowledge of self care after hospitalization with mod I assistance from wife using educational materials provided  Outcome: Progressing

## 2024-04-29 NOTE — Progress Notes (Signed)
 Physical Therapy Session Note  Patient Details  Name: Roger Pace MRN: [id number] Date of Birth: July 30, 1971  Today's Date: 04/29/2024 PT Individual Time: 9099-9041, 8672-8569 PT Individual Time Calculation (min): 58 min, 63 min   Short Term Goals: Week 1:  PT Short Term Goal 1 (Week 1): pt will perform sit to stand with RW and min A with LRAD PT Short Term Goal 2 (Week 1): pt will ambulate 100 feet with LRAD and min A PT Short Term Goal 3 (Week 1): pt will demonstrate ability to actively extend [name] to -5 deg or less to reduce dependence on knee immobilizer  Skilled Therapeutic Interventions/Progress Updates:      Treatment Session 1   Pt supine in bed upon arrival. Pt agreeable to therapy. Pt denies any pain.   Pt seated EOB with bed elevated and performed passive and AA knee flexion B with use of contract release technique.   ROM: L knee flexion 75 actively, passively 80; [name] flexion actively 70, passively 76   Pt endorses he has been seated EOB and attempting to bend as much as possible. Encouraged pt to also utilized alternative LE or gait belt to assist with knee flexion ROM.   Pt performed standing marching x10 B with use of RW, verbal and tactile cues provided for hip and trunk extension for reduced reliance on UE and improved activation on [name].   Pt performed passive supine hip and knee flexion---pt then performed active assisted knee flexion with use of gait belt. Recommended pt perform in room (actively as far as pt can go, using belt to assist with remaining motion and holding for 5 at end range). Pt currently able to achieve 85 deg.   Pt endorses improved ability to tolerate the bone foam tolerating 15 min at a time. Pt endorses not using CPM last night 2/2 achilles pain. Plan to trail CPM next session.   Pt supine in bed at end of session with all needs within reach and bed alarm on.   Treatment Session 2   Pt supine in bed upon arrival. Pt agreeable to  therapy. Pt denies any pain.   Pt performed sit to stand throughout session from bed and WC level with RW and CGA to stabilize RW. Pt attempting to bend non immobilized knee as much as tolerated.   Alternated knee immobilizer throughout session.   Pt ambulated outside x~100 feet on even/uneven pavement with RW and supervision, verbal cues provided for upright posture, heel toe pattern.   Pt navigated 2x3 3inch steps with B handrails and step to gait and CGA, verbal cues provided for sequencing (ascending forwards, descending backwards--ascending leading with non immobilized LE descending leading with immobilized LE   Pt performed toe taps 1x10 B on 6 inch steps with B handrails, verabl cues provided for improved hip and knee flexion for improved clearance while minimizing compensations (circumduction and hip hike)  Verbal/tactile cues provided for terminal knee extension in stance. Pt utilized L LE on elevated step as lever to improve AA knee flexion ROM as tolerated.   Pt seated in WC at end of session with all needs wtihin reach and bed alarm on.         Therapy Documentation Precautions:  Precautions Precautions: Fall, Knee Recall of Precautions/Restrictions: Intact Precaution/Restrictions Comments: Pt educated on need for knee extension at this time. Bed locked in extension, and education provided that NO pillow/roll/ice pack should be placed under the knee. Required Braces or Orthoses:  Knee Immobilizer - Left Knee Immobilizer - Right: Other (comment) (Dr. Addie, try to alternate knee immobilizer between legs to allow each leg the chance to bear more of the load when mobilizing.) Knee Immobilizer - Left: Other (comment) (Dr. Addie, try to alternate knee immobilizer between legs to allow each leg the chance to bear more of the load when mobilizing.) Restrictions Weight Bearing Restrictions Per Provider Order: Yes RLE Weight Bearing Per Provider Order: Weight bearing as  tolerated LLE Weight Bearing Per Provider Order: Weight bearing as tolerated  Therapy/Group: Individual Therapy  [facility] [place], [place], DPT  04/29/2024, 9:21 AM

## 2024-04-29 NOTE — Progress Notes (Addendum)
 PROGRESS NOTE   Subjective/Complaints: No acute events overnight. Continues to have limited knee flexion, therapy working on this issue.   ROS: Patient denies recent fever or chills, new vision changes, dizziness, nausea, vomiting,   shortness of breath or chest pain, headache, or mood change. + Achilles pain right-chronic issue for him +Loose Bms improving  Objective:   No results found.  Recent Labs    04/27/24 0512  WBC 11.8*  HGB 7.7*  HCT 24.1*  PLT 724*   Recent Labs    04/27/24 0512  NA 133*  K 4.5  CL 101  CO2 24  GLUCOSE 113*  BUN 15  CREATININE 1.03  CALCIUM  8.8*    Intake/Output Summary (Last 24 hours) at 04/29/2024 1214 Last data filed at 04/29/2024 1113 Gross per 24 hour  Intake 780 ml  Output 1500 ml  Net -720 ml        Physical Exam: Vital Signs Blood pressure 124/64, pulse 92, temperature 98.7 F (37.1 C), resp. rate 20, height 5' 11.5 (1.816 m), weight 118 kg, SpO2 99%.  Constitutional: No distress . Vital signs reviewed.  Working with therapy in bed HEENT: NCAT, EOMI, oral membranes moist Neck: supple Cardiovascular: RRR without murmur. No JVD    Respiratory/Chest: CTA Bilaterally without wheezes or rales. Normal effort    GI/Abdomen: BS +, non-tender, non-distended, soft Ext: no clubbing, cyanosis, or edema Psych: pleasant and cooperative  Skin: Clean and intact without signs of breakdown. Knee incisions appear intact with post-op dressings in place. Neuro:      Mental Status: AAOx3, good insight and awareness Speech/Languate: Follows commands CRANIAL NERVES: 2-12 grossly intact   MOTOR: RUE: 5/5 Deltoid, 5/5 Biceps, 5/5 Triceps,5/5 Grip LUE: 5/5 Deltoid, 5/5 Biceps, 5/5 Triceps, 5/5 Grip RLE: HF 2-3/5, KE 2-3/5, ADF 4/5, APF 4/5 LLE: HF 2-3/5, KE 2-3/5, ADF 4/5, APF 4/5   SENSORY: Normal to touch all 4 extremities  MSK: both knees somewhat sensitive to palpation Right  Achilles tenderness palpation- unchanged   Prior neuro assessment is c/w today's exam 04/29/2024.   Assessment/Plan: 1. Functional deficits which require 3+ hours per day of interdisciplinary therapy in a comprehensive inpatient rehab setting. Physiatrist is providing close team supervision and 24 hour management of active medical problems listed below. Physiatrist and rehab team continue to assess barriers to discharge/monitor patient progress toward functional and medical goals  Care Tool:  Bathing    Body parts bathed by patient: Right arm, Left arm, Chest, Abdomen, Front perineal area, Right upper leg, Left upper leg   Body parts bathed by helper: Left lower leg, Right lower leg, Buttocks     Bathing assist Assist Level: Maximal Assistance - Patient 24 - 49%     Upper Body Dressing/Undressing Upper body dressing        Upper body assist Assist Level: Set up assist    Lower Body Dressing/Undressing Lower body dressing            Lower body assist Assist for lower body dressing: Maximal Assistance - Patient 25 - 49%     Toileting Toileting    Toileting assist Assist for toileting: Maximal Assistance - Patient 25 - 49%  Transfers Chair/bed transfer  Transfers assist     Chair/bed transfer assist level: Contact Guard/Touching assist     Locomotion Ambulation   Ambulation assist      Assist level: Minimal Assistance - Patient > 75% Assistive device: Walker-rolling Max distance: 9ft   Walk 10 feet activity   Assist     Assist level: Minimal Assistance - Patient > 75% Assistive device: Walker-rolling   Walk 50 feet activity   Assist Walk 50 feet with 2 turns activity did not occur: Safety/medical concerns (fatigue/pain)  Assist level: Minimal Assistance - Patient > 75% Assistive device: Walker-rolling    Walk 150 feet activity   Assist Walk 150 feet activity did not occur: Safety/medical concerns         Walk 10 feet on  uneven surface  activity   Assist Walk 10 feet on uneven surfaces activity did not occur: Safety/medical concerns         Wheelchair     Assist Is the patient using a wheelchair?: Yes Type of Wheelchair: Manual    Wheelchair assist level: Supervision/Verbal cueing Max wheelchair distance: 150    Wheelchair 50 feet with 2 turns activity    Assist        Assist Level: Supervision/Verbal cueing   Wheelchair 150 feet activity     Assist      Assist Level: Supervision/Verbal cueing   Blood pressure 124/64, pulse 92, temperature 98.7 F (37.1 C), resp. rate 20, height 5' 11.5 (1.816 m), weight 118 kg, SpO2 99%.  Medical Problem List and Plan: 1. Functional deficits secondary to B/L knee OA s/p b/l TKA complicated by acute PE             -patient may shower, cover incisions please             -ELOS/Goals: 12-15 days, PT/OT Mod I             -Discussed with Dr. Addie, appreciate assistance, pt to alternate knee immobilizers between legs, he can DC this when he can do 10 straight leg raises with each leg   -continue CPM  -Continue CIR therapies including PT, OT. Encouraged pt to push himself more on the CPM. He's only around 60 degrees currently   -Team conference today please see physician documentation under team conference tab, met with team  to discuss problems,progress, and goals. Formulized individual treatment plan based on medical history, underlying problem and comorbidities.   2.  Acute DVT/PE/Antithrombotics: -DVT/anticoagulation:  Pharmaceutical: Xarelto              -antiplatelet therapy: N/A 3. Pain Management: oxycodone  and robaxin  prn.  -appears controlled  - 9/9 right Achilles pain- Voltaren  and heat  -9/10 continue current regimen of PRN oxycodone , not using too frequently 4. Mood/Behavior/Sleep: LCSW to follow for evaluation and support.              -antipsychotic agents: N/A 5. Neuropsych/cognition: This patient is capable of making  decisions on his own behalf. 6. Skin/Wound Care: Routine pressure relief measures. --Surgical dressings intact on both knees.  -9/7 buttock tenderness. I didn't see any signs of breakdown or pressure. Will continue to monitor, encouraged up to chair as much as possible. Nursing to follow up 7. Fluids/Electrolytes/Nutrition: Monitor I/O. Check CMET in am  8. 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 Addie 9. BP/Orthostatic hypotension: BP improving. Continue to monitor.  10.  Hyponatremia/Hypochloremia: Improving with improvement in intake? -9/4 hyponatremia improved to 134, potassium stable at  4.5 -9/8 sodium 133 today, potassium 4.5, continue to monitor trend Recheck today 11. ABLA: Recheck CBC in am. HGB 11.5 on 8/29  - 9/3 Hemoglobin down to 7.3, recheck today, FOBT.  Will recheck tomorrow to ensure it stable  -9/5 FOBT negative, hemoglobin a little improved.  Continue to monitor daily for now.  Xarelto  continued for now  9/7-sl dip this weekend (7.3), but he was at similar numbers mid last week   -continue to monitor serially  9/8 hemoglobin stable at 7.7 continue to follow  Recheck today 12.  Low grade fevers: T-max 100.3 yesterday evening.  --Continue to monitor for signs of infection. CXR and UA neg --BC X 2  08/30 -->negative/pending.  -9/3 WBC a little improved to 12.7 -9/4 recheck chest x-ray, lactic acid, procalcitonin, urinalysis, blood cultures -9/5 lactic acid 2.2 initially yesterday but improved to 1.6, had fluid bolus.  Blood cultures with no growth, urine does not indicate infection, chest x-ray without acute changes 9/7 WBC's still 14k. UA negative. Wounds look ok. CXR NAD  -BCx NG x2d   -monitor only for now  9/8 WBC improved today to 11.8, not having significant fevers.  Continue to monitor  9/9 no significant fevers in the last 24 hours, recheck CBC tomorrow  CBC today, afebrile 13. Constipation: Up all night after mag citrate.    -Continue Miralax  decrease to  daily, senokot  -Dulcolax supp  PRN  -9/4 LBM today a little loose, decrease Senokot from twice daily to at bedtime -9/9 patient reports loose bowel movements, will discontinue Senokot and change MiraLAX  to as needed 15. Impaired fasting glucose:  Check AIc in am. A1C 4.9 16. Orthostatic hypotension  - Encourage fluid intake, order abdominal binder  -9/10 improved with TED hose      04/29/2024    4:07 AM 04/28/2024    7:37 PM 04/28/2024    1:44 PM  Vitals with BMI  Systolic 124 129 864  Diastolic 64 71 67  Pulse 92 107 109   17.  Transaminitis.  Has history of chronic mild elevation.  Continue to monitor  - 9/8 AST and ALT a little decreased today from last measurement  Recheck today 18.  Thrombocytosis.  Likely reactive.  Continue to monitor  -9/10 recheck labs      LOS: 7 days A FACE TO FACE EVALUATION WAS PERFORMED  Murray Collier 04/29/2024, 12:14 PM

## 2024-04-29 NOTE — Patient Care Conference (Signed)
 Inpatient RehabilitationTeam Conference and Plan of Care Update Date: 04/29/2024   Time: 12:10 AM    Patient Name: Roger Pace      Medical Record Number: [id number]  Date of Birth: [birth year] Sex: Male         Room/Bed: 4M11C/4M11C-01 Payor Info: Payor: Advertising copywriter / Plan: [employer] OTHER / Product Type: *No Product type* /    Admit Date/Time:  04/22/2024  3:27 PM  Primary Diagnosis:  OA (osteoarthritis) of knee  Hospital Problems: Principal Problem:   OA (osteoarthritis) of knee Active Problems:   Hyponatremia   ABLA (acute blood loss anemia)   Leukocytosis   Constipation   Acute pulmonary embolism [facility])    Expected Discharge Date: Expected Discharge Date: 05/01/24  Team Members Present: Physician leading conference: Dr. Murray Collier Social Worker Present: Other (comment) (DiAsia Loreli, SW) Nurse Present: Barnie Ronde, RN PT Present: Doreene Orris, PT OT Present: Other (comment) ANCEL Seip OT) SLP Present: Rosina Downy, SLP PPS Coordinator present : Eleanor Colon, SLP     Current Status/Progress Goal Weekly Team Focus  Bowel/Bladder   Continent B/B   LBM 09/08   Will remain continent with normal pattern   Assist with toilet needs qshift/prn    Swallow/Nutrition/ Hydration               ADL's   LB mod, sit to stands mod to max, lateral transfers SUP to CG. completing ADLs in bed. will work on upright ADLS.   SUP   sit to stands, transfers, LB ADLS, LE ROM and strength related to ADL performance and mobility    Mobility   bed mobility: supervision, sit to stand CGA elevated surface, CGA.light min A from standard height (fluctuates with pain/fatigue), pt heavily dependent on B UE strength, gait x 150+ feet with RW and supervision, car transfer to back seat with RW and min A   supervision/min A  D/C 9/12, follow up HHPT, DME: RW work on progressing independence with mobility, knee flexion/extesino ROM    Communication                 Safety/Cognition/ Behavioral Observations               Pain   Verbalizes pain 8/10 to surgical sites. Scheduled/prn pain medication to manage pain   Will verbalizes pain level <3   Assess pain qshift/prn    Skin   Surgical incision to bilateral knees   Skin will be free from infection  Assess skin for s/s of infection and promote healing      Discharge Planning:  Plans to discharge home with wife, Rosaline who will provide transportation upon discharge and 24/7 care. Awaiting therapy follow up and recommendations   Team Discussion: Patient admitted post bilateral TKR with post op tachycardia and fever; workup noted bilateral DVTs started on Xarelto  and pulmonary embolism in the right LL.  Progress limited by pain and orthostasis.  Patient on target to meet rehab goals: yes, currently needs supervision for lateral transfers and toileting. Functional progress limited by previous  achilles injury flare with CPM use and limited ROM in right leg. PROM up to 85 degrees and AROM up to 75 degrees. Goals for discharge set for supervision overall.  *See Care Plan and progress notes for long and short-term goals.   Revisions to Treatment Plan:  N/a   Teaching Needs: Safety, medication, transfers, toileting, CPM use/management, etc.   Current Barriers to Discharge: Decreased caregiver support  Possible  Resolutions to Barriers: Family education HH follow up services DME: RW     Medical Summary Current Status: B/L knee OA s/p b/l TKA complicated by acute PE, fevers, transamnitis, Orthostatic hypotension, achilles pain, loose bms  Barriers to Discharge: Hypotension;Medical stability;Self-care education  Barriers to Discharge Comments: B/L knee OA s/p b/l TKA complicated by acute PE, fevers, transamnitis, Orthostatic hypotension, achilles pain, loose bms Possible Resolutions to [employer] Focus: CBC/BMP tomorrow, monitor bowel function, pain control, work on knee  felxion   Continued Need for Acute Rehabilitation Level of Care: The patient requires daily medical management by a physician with specialized training in physical medicine and rehabilitation for the following reasons: Direction of a multidisciplinary physical rehabilitation program to maximize functional independence : Yes Medical management of patient stability for increased activity during participation in an intensive rehabilitation regime.: Yes Analysis of laboratory values and/or radiology reports with any subsequent need for medication adjustment and/or medical intervention. : Yes   I attest that I was present, lead the team conference, and concur with the assessment and plan of the team.   [name] 04/29/2024, 6:48 PM

## 2024-04-30 MED ORDER — SODIUM CHLORIDE 1 G PO TABS
1.0000 g | ORAL_TABLET | Freq: Every day | ORAL | Status: DC
  Administered 2024-04-30: 1 g via ORAL
  Filled 2024-04-30: qty 1

## 2024-04-30 NOTE — Progress Notes (Signed)
 PROGRESS NOTE   Subjective/Complaints: Working on knee flexion in the gym with therapy this morning.  Pain is overall under control.  No new complaints or concerns.  LBM about 2 days ago.  ROS: Patient denies recent fever or chills, new vision changes, dizziness, nausea, vomiting,   shortness of breath or chest pain, headache, or mood change. + Achilles pain right-chronic issue for him +Loose Bms improved  Objective:   No results found.  Recent Labs    04/29/24 1459  WBC 10.3  HGB 8.4*  HCT 26.4*  PLT 702*   Recent Labs    04/29/24 1459  NA 133*  K 4.0  CL 101  CO2 21*  GLUCOSE 116*  BUN 20  CREATININE 1.14  CALCIUM  8.8*    Intake/Output Summary (Last 24 hours) at 04/30/2024 1036 Last data filed at 04/29/2024 2306 Gross per 24 hour  Intake 413 ml  Output 2025 ml  Net -1612 ml        Physical Exam: Vital Signs Blood pressure 117/69, pulse 93, temperature 99 F (37.2 C), resp. rate 19, height 5' 11.5 (1.816 m), weight 118 kg, SpO2 94%.  Constitutional: No distress . Vital signs reviewed.  Working with therapy in the gym HEENT: NCAT, EOMI, oral membranes moist Neck: supple Cardiovascular: RRR without murmur. No JVD    Respiratory/Chest: CTA Bilaterally without wheezes or rales. Normal effort    GI/Abdomen: BS +, non-tender, non-distended, soft Ext: no clubbing, cyanosis, or edema Psych: pleasant and cooperative  Skin: Clean and intact without signs of breakdown. Knee incisions appear intact with post-op dressings in place. Neuro:      Mental Status: AAOx3, good insight and awareness Speech/Languate: Follows commands CRANIAL NERVES: 2-12 grossly intact   MOTOR: RUE: 5/5 Deltoid, 5/5 Biceps, 5/5 Triceps,5/5 Grip LUE: 5/5 Deltoid, 5/5 Biceps, 5/5 Triceps, 5/5 Grip RLE: HF 2-3/5, KE 2-3/5, ADF 4/5, APF 4/5 LLE: HF 2-3/5, KE 2-3/5, ADF 4/5, APF 4/5   SENSORY: Normal to touch all 4  extremities  MSK: both knees somewhat sensitive to palpation Right Achilles tenderness palpation- unchanged Sitting in the gym-looks like knees at about 80-85 degrees of flexion Prior neuro assessment is c/w today's exam 04/30/2024.   Assessment/Plan: 1. Functional deficits which require 3+ hours per day of interdisciplinary therapy in a comprehensive inpatient rehab setting. Physiatrist is providing close team supervision and 24 hour management of active medical problems listed below. Physiatrist and rehab team continue to assess barriers to discharge/monitor patient progress toward functional and medical goals  Care Tool:  Bathing    Body parts bathed by patient: Right arm, Left arm, Chest, Abdomen, Front perineal area, Right upper leg, Left upper leg, Right lower leg, Left lower leg, Face, Buttocks   Body parts bathed by helper: Left lower leg, Right lower leg, Buttocks     Bathing assist Assist Level: Supervision/Verbal cueing     Upper Body Dressing/Undressing Upper body dressing        Upper body assist Assist Level: Independent with assistive device    Lower Body Dressing/Undressing Lower body dressing            Lower body assist Assist for lower body dressing:  Supervision/Verbal cueing     Toileting Toileting    Toileting assist Assist for toileting: Supervision/Verbal cueing     Transfers Chair/bed transfer  Transfers assist     Chair/bed transfer assist level: Contact Guard/Touching assist     Locomotion Ambulation   Ambulation assist      Assist level: Minimal Assistance - Patient > 75% Assistive device: Walker-rolling Max distance: 32ft   Walk 10 feet activity   Assist     Assist level: Minimal Assistance - Patient > 75% Assistive device: Walker-rolling   Walk 50 feet activity   Assist Walk 50 feet with 2 turns activity did not occur: Safety/medical concerns (fatigue/pain)  Assist level: Minimal Assistance - Patient >  75% Assistive device: Walker-rolling    Walk 150 feet activity   Assist Walk 150 feet activity did not occur: Safety/medical concerns         Walk 10 feet on uneven surface  activity   Assist Walk 10 feet on uneven surfaces activity did not occur: Safety/medical concerns         Wheelchair     Assist Is the patient using a wheelchair?: Yes Type of Wheelchair: Manual    Wheelchair assist level: Supervision/Verbal cueing Max wheelchair distance: 150    Wheelchair 50 feet with 2 turns activity    Assist        Assist Level: Supervision/Verbal cueing   Wheelchair 150 feet activity     Assist      Assist Level: Supervision/Verbal cueing   Blood pressure 117/69, pulse 93, temperature 99 F (37.2 C), resp. rate 19, height 5' 11.5 (1.816 m), weight 118 kg, SpO2 94%.  Medical Problem List and Plan: 1. Functional deficits secondary to B/L knee OA s/p b/l TKA complicated by acute PE             -patient may shower, cover incisions please             -ELOS/Goals: 12-15 days, PT/OT Mod I             -Discussed with Dr. Addie, appreciate assistance, pt to alternate knee immobilizers between legs, he can DC this when he can do 10 straight leg raises with each leg   -continue CPM  -Continue CIR therapies including PT, OT. Encouraged pt to push himself more on the CPM. He's only around 60 degrees currently   - Expected discharge tomorrow  2.  Acute DVT/PE/Antithrombotics: -DVT/anticoagulation:  Pharmaceutical: Xarelto              -antiplatelet therapy: N/A 3. Pain Management: oxycodone  and robaxin  prn.  -appears controlled  - 9/9 right Achilles pain- Voltaren  and heat  -9/11 pain overall controlled, using oxycodone  about twice a day 4. Mood/Behavior/Sleep: LCSW to follow for evaluation and support.              -antipsychotic agents: N/A 5. Neuropsych/cognition: This patient is capable of making decisions on his own behalf. 6. Skin/Wound Care: Routine  pressure relief measures. --Surgical dressings intact on both knees.  -9/7 buttock tenderness. I didn't see any signs of breakdown or pressure. Will continue to monitor, encouraged up to chair as much as possible. Nursing to follow up 7. Fluids/Electrolytes/Nutrition: Monitor I/O. Check CMET in am   - 9/11 BUN/creatinine appear to be around his baseline, yesterday 20/1.14.  Encourage oral fluid intake 8. 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 Addie 9. BP/Orthostatic hypotension: BP improving. Continue to monitor.  10.  Hyponatremia/Hypochloremia: Improving with improvement in  intake? -9/4 hyponatremia improved to 134, potassium stable at 4.5 -9/8 sodium 133 today, potassium 4.5, continue to monitor trend -9/11 sodium remains a little low at 133, will add daily salt tab 11. ABLA: Recheck CBC in am. HGB 11.5 on 8/29  - 9/3 Hemoglobin down to 7.3, recheck today, FOBT.  Will recheck tomorrow to ensure it stable  -9/5 FOBT negative, hemoglobin a little improved.  Continue to monitor daily for now.  Xarelto  continued for now  9/7-sl dip this weekend (7.3), but he was at similar numbers mid last week   -continue to monitor serially  9/8 hemoglobin stable at 7.7 continue to follow  9/11 stable at 8.4 12.  Low grade fevers: T-max 100.3 yesterday evening.  --Continue to monitor for signs of infection. CXR and UA neg --BC X 2  08/30 -->negative/pending.  -9/3 WBC a little improved to 12.7 -9/4 recheck chest x-ray, lactic acid, procalcitonin, urinalysis, blood cultures -9/5 lactic acid 2.2 initially yesterday but improved to 1.6, had fluid bolus.  Blood cultures with no growth, urine does not indicate infection, chest x-ray without acute changes 9/7 WBC's still 14k. UA negative. Wounds look ok. CXR NAD  -BCx NG x2d   -monitor only for now  9/8 WBC improved today to 11.8, not having significant fevers.  Continue to monitor  9/9 no significant fevers in the last 24 hours, recheck CBC tomorrow  9/11  fevers remain improved WBC down to 10.3 13. Constipation: Up all night after mag citrate.    -Continue Miralax  decrease to daily, senokot  -Dulcolax supp  PRN  -9/4 LBM today a little loose, decrease Senokot from twice daily to at bedtime -9/9 patient reports loose bowel movements, will discontinue Senokot and change MiraLAX  to as needed -9/11 discussed as needed laxatives available for use later today if does not have a bowel movement before then 15. Impaired fasting glucose:  Check AIc in am. A1C 4.9 16. Orthostatic hypotension  - Encourage fluid intake, order abdominal binder  -9/10 improved with TED hose  - 9/11 add salt tabs as above      04/30/2024    6:00 AM 04/29/2024    8:10 PM 04/29/2024    4:07 AM  Vitals with BMI  Systolic 117 119 875  Diastolic 69 64 64  Pulse 93 103 92   17.  Transaminitis.  Has history of chronic mild elevation.  Continue to monitor  - 9/8 AST and ALT a little decreased today from last measurement  Recheck today 18.  Thrombocytosis.  Likely reactive.  Continue to monitor  -9/11 a little improved at 702    LOS: 8 days A FACE TO FACE EVALUATION WAS PERFORMED  Murray Collier 04/30/2024, 10:36 AM

## 2024-04-30 NOTE — Progress Notes (Signed)
 Occupational Therapy Session Note  Patient Details  Name: DEVLIN BRINK MRN: [id number] Date of Birth: 09/19/1970  Today's Date: 04/30/2024 OT Individual Time: 0805-0901 OT Individual Time Calculation (min): 56 min    Short Term Goals: Week 2:  OT Short Term Goal 1 (Week 2): STG=LTG d/t Elos  Skilled Therapeutic Interventions/Progress Updates:  Patient agreeable to participate in OT session. Reports 8/10  pain level premedicated.   Patient participated in skilled OT session focusing on discharge planning, caregiver education, and LE ROM. Completed family training with patient spouse. Therapist educated family regarding tub transfers, bed transfers, ROM, safety. Education also provided on strategies and compensatory techniques to use if patient required additional assistance with mobility. Education provided for gait belt use and handling technique. Patient spouse completed hands on training for dressing, toileting, and all transfers with therapist providing VC as needed for technique and form. All education completed and all questions. Patient followed up caregiver training with Passive / active assist . Patient able to complete approximately 75 degrees of flexion and complete 5 leg raises on each side. Patient required light assist on R leg, no assist on L leg to decrease extensor lag. Passed off to pt.   Therapy Documentation Precautions:  Precautions Precautions: Fall, Knee Recall of Precautions/Restrictions: Intact Precaution/Restrictions Comments: Pt educated on need for knee extension at this time. Bed locked in extension, and education provided that NO pillow/roll/ice pack should be placed under the knee. Required Braces or Orthoses: Knee Immobilizer - Left Knee Immobilizer - Right: Other (comment) (Dr. Addie, try to alternate knee immobilizer between legs to allow each leg the chance to bear more of the load when mobilizing.) Knee Immobilizer - Left: Other (comment) (Dr. Addie,  try to alternate knee immobilizer between legs to allow each leg the chance to bear more of the load when mobilizing.) Restrictions Weight Bearing Restrictions Per Provider Order: Yes RLE Weight Bearing Per Provider Order: Weight bearing as tolerated LLE Weight Bearing Per Provider Order: Weight bearing as tolerated   Therapy/Group: Individual Therapy  D'mariea L Dereke Neumann 04/30/2024, 7:24 AM

## 2024-04-30 NOTE — Progress Notes (Signed)
 Inpatient Rehabilitation Care Coordinator Discharge Note   Patient Details  Name: Roger Pace MRN: [id number] Date of Birth: 06/25/1971   Discharge location: Home with wife, Roger Pace who will proivide care and support  Length of Stay: 9 days  Discharge activity level: Supervision/Verbal cueing  Home/community participation: Active in the community  Patient response un:Yzjouy Literacy - How often do you need to have someone help you when you read instructions, pamphlets, or other written material from your doctor or pharmacy?: Never  Patient response un:Dnrpjo Isolation - How often do you feel lonely or isolated from those around you?: Never  Services provided included: MD, RD, PT, OT, SLP, RN, CM, TR, Pharmacy, SW  Financial Services:  Field seismologist Utilized: Private Insurance [employer] / Advertising copywriter OTHER  Choices offered to/list presented to: Patient, wife  Follow-up services arranged:  Home Health, DME Home Health Agency: [facility] [phone]    DME : Ordered through [facility]    Patient response to transportation need: Is the patient able to respond to transportation needs?: Yes In the past 12 months, has lack of transportation kept you from medical appointments or from getting medications?: No In the past 12 months, has lack of transportation kept you from meetings, work, or from getting things needed for daily living?: No   Patient/Family verbalized understanding of follow-up arrangements:  Yes  Individual responsible for coordination of the follow-up plan: Patient or wife  Confirmed correct DME delivered: Roger Pace 04/30/2024    Comments (or additional information):Wife, Roger Pace participated in family education to address 24/7 care needs for patient   Summary of Stay    Date/Time Discharge Planning CSW  04/28/24 1212 Plans to discharge home with wife, Roger Pace who will provide transportation upon discharge and 24/7 care. Awaiting  therapy follow up and recommendations DS       Roger Pace

## 2024-04-30 NOTE — Plan of Care (Signed)
  Problem: Consults Goal: RH GENERAL PATIENT EDUCATION Description: See Patient Education module for education specifics. Outcome: Progressing   Problem: RH BOWEL ELIMINATION Goal: RH STG MANAGE BOWEL WITH ASSISTANCE Description: STG Manage Bowel with mod I Assistance. Outcome: Progressing   Problem: RH BLADDER ELIMINATION Goal: RH STG MANAGE BLADDER WITH ASSISTANCE Description: STG Manage Bladder With  mod I Assistance Outcome: Progressing   Problem: RH SKIN INTEGRITY Goal: RH STG SKIN FREE OF INFECTION/BREAKDOWN Description: Manage skin free of infection / breakdown with mod I assistance Outcome: Progressing   Problem: RH SAFETY Goal: RH STG ADHERE TO SAFETY PRECAUTIONS W/ASSISTANCE/DEVICE Description: STG Adhere to Safety Precautions With mod I  Assistance/Device. Outcome: Progressing   Problem: RH PAIN MANAGEMENT Goal: RH STG PAIN MANAGED AT OR BELOW PT'S PAIN GOAL Description: <4 w/ prns Outcome: Progressing   Problem: RH KNOWLEDGE DEFICIT GENERAL Goal: RH STG INCREASE KNOWLEDGE OF SELF CARE AFTER HOSPITALIZATION Description: Manage increase knowledge of self care after hospitalization with mod I assistance from wife using educational materials provided  Outcome: Progressing

## 2024-04-30 NOTE — Progress Notes (Signed)
 Physical Therapy Session Note  Patient Details  Name: Roger Pace MRN: [id number] Date of Birth: 09/10/1970  Today's Date: 04/30/2024 PT Individual Time: 8698-8668 PT Individual Time Calculation (min): 30 min   Short Term Goals: Week 1:  PT Short Term Goal 1 (Week 1): pt will perform sit to stand with RW and min A with LRAD PT Short Term Goal 2 (Week 1): pt will ambulate 100 feet with LRAD and min A PT Short Term Goal 3 (Week 1): pt will demonstrate ability to actively extend [name] to -5 deg or less to reduce dependence on knee immobilizer  Skilled Therapeutic Interventions/Progress Updates:   Received pt semi-reclined in bed, pt agreeable to PT treatment, and reported pain in knees was good (unrated). Session with emphasis on functional mobility/transfers, generalized strengthening and endurance, ambulation, and stair navigation. Donned knee immobilizer to LLE with setup assist and transferred semi-reclined<>sitting R EOB mod I and donned slip on shoes with mod A. Pt performed all transfers from elevated surfaces with RW and supervision throughout session. Pt ambulated 159ft x 2 trials with RW and supervision/mod I to/from main therapy gym. Pt navigated 4 6in steps with bilateral handrails leading with RLE and CGA fading to supervision, then sat and switched knee immobilizer to RLE, and navigated additional 4 6in steps leading with LLE and supervision - cues to decrease reliance on UE support. Returned to room and transferred into semi-reclined mod I. Concluded session with pt semi-reclined in bed with all needs within reach and wife at bedside.    Therapy Documentation Precautions:  Precautions Precautions: Fall, Knee Recall of Precautions/Restrictions: Intact Precaution/Restrictions Comments: Pt educated on need for knee extension at this time. Bed locked in extension, and education provided that NO pillow/roll/ice pack should be placed under the knee. Required Braces or Orthoses:  Knee Immobilizer - Left Knee Immobilizer - Right: Other (comment) (Dr. Addie, try to alternate knee immobilizer between legs to allow each leg the chance to bear more of the load when mobilizing.) Knee Immobilizer - Left: Other (comment) (Dr. Addie, try to alternate knee immobilizer between legs to allow each leg the chance to bear more of the load when mobilizing.) Restrictions Weight Bearing Restrictions Per Provider Order: Yes RLE Weight Bearing Per Provider Order: Weight bearing as tolerated LLE Weight Bearing Per Provider Order: Weight bearing as tolerated  Therapy/Group: Individual Therapy [name] Therisa Stains PT, DPT 04/30/2024, 7:07 AM

## 2024-04-30 NOTE — Group Note (Signed)
 Patient Details Name: Roger Pace MRN: [id number] DOB: 06-Apr-1971 Today's Date: 04/30/2024  Time Calculation: OT Group Time Calculation OT Group Start Time: 1430 OT Group Stop Time: 1530 OT Group Time Calculation (min): 60 min      Group Description: Dance Group: Pt participated in dance group with an emphasis on social interaction, motor planning, increasing overall activity tolerance and bimanual tasks. All songs were selected by group members. Dance moves included AROM of BUE/BLE gross motor movements with an emphasis on building functional endurance.   Individual level documentation: Patient completed group from sitting level. Patientt needed supervision to complete various dance moves with OT providing visual model.  Patient able to create his own modifications during group.  Pain:  0/10  Precautions: Precautions: Fall, Knee  Katheryn SHAUNNA [place]ines 04/30/2024, 3:53 PM

## 2024-04-30 NOTE — Progress Notes (Addendum)
 Occupational Therapy Discharge Summary  Patient Details  Name: Roger Pace MRN: [id number] Date of Birth: February 22, 1971  Date of Discharge from OT service:April 30, 2024   Patient has met 9 of 10 long term goals due to improved activity tolerance, improved balance, postural control, and ability to compensate for deficits.  Patient to discharge at overall Supervision level.  Patient's care partner is independent to provide the necessary physical assistance at discharge.    Reasons goals not met: patient unable to meet sit to stand goal due to patient decreased LE strength. Patient is able to complete functional lateral transfers and sit to stands with SUP from elevated surfaces. Patient able to ambulate to all locations.   Recommendation:  Patient will benefit from ongoing skilled OT services in home health setting to continue to advance functional skills in the area of BADL and iADL.  Equipment: Long handled sponge, dressing stick.  Reasons for discharge: treatment goals met and discharge from hospital  Patient/family agrees with progress made and goals achieved: Yes  OT Discharge Precautions/Restrictions  Precautions Precautions: Fall;Knee Recall of Precautions/Restrictions: Intact Precaution/Restrictions Comments: Pt educated on need for knee extension at this time. Bed locked in extension, and education provided that NO pillow/roll/ice pack should be placed under the knee. Required Braces or Orthoses: Knee Immobilizer - Left;Knee Immobilizer - Right Knee Immobilizer - Right: Other (comment) (switch on legs while walking) Restrictions Weight Bearing Restrictions Per Provider Order: Yes RLE Weight Bearing Per Provider Order: Weight bearing as tolerated LLE Weight Bearing Per Provider Order: Weight bearing as tolerated General   Vital Signs Therapy Vitals Temp: 99 F (37.2 C) Pulse Rate: 93 Resp: 19 BP: 117/69 Patient Position (if appropriate): Lying Oxygen  Therapy SpO2: 94 % O2 Device: Room Air Pain Pain Assessment Pain Scale: 0-10 Pain Score: 8  Pain Type: Acute pain Pain Location: Knee Pain Intervention(s): Medication (See eMAR) ADL ADL Eating: Modified independent Grooming: Modified independent Upper Body Bathing: Modified independent Where Assessed-Upper Body Bathing: Bed level Lower Body Bathing: Supervision/safety Where Assessed-Lower Body Bathing: Bed level, Sitting at sink Upper Body Dressing: Independent Where Assessed-Upper Body Dressing: Chair Lower Body Dressing: Supervision/safety Where Assessed-Lower Body Dressing: Edge of bed Toileting: Supervision/safety Where Assessed-Toileting: Bedside Commode, Bed level Toilet Transfer: Close supervision Toilet Transfer Method: Proofreader: Raised toilet seat, Extra wide bedside commode Geologist, engineering Method: Designer, industrial/product: Grab bars Vision Baseline Vision/History: 1 Wears glasses;0 No visual deficits Patient Visual Report: No change from baseline Vision Assessment?: No apparent visual deficits Perception  Perception: Within Functional Limits Praxis Praxis: WFL Cognition Cognition Overall Cognitive Status: Within Functional Limits for tasks assessed Arousal/Alertness: Awake/alert Orientation Level: Person;Place;Situation Awareness: Appears intact Problem Solving: Appears intact Brief Interview for Mental Status (BIMS) Repetition of Three Words (First Attempt): 3 Temporal Orientation: Year: Correct Temporal Orientation: Month: Accurate within 5 days Temporal Orientation: Day: Correct Recall: Sock: Yes, no cue required Recall: Blue: Yes, no cue required Recall: Bed: Yes, no cue required BIMS Summary Score: 15 Sensation Sensation Light Touch: Appears Intact Coordination Gross Motor Movements are Fluid and Coordinated: No Fine Motor Movements are Fluid and  Coordinated: Yes Coordination and Movement Description: coordination impaired by strength/ROM deficits/pain; LLE impairements > [name] impairments Finger Nose Finger Test: [facility] Motor  Motor Motor: Within Functional Limits Mobility  Bed Mobility Bed Mobility: Rolling Right;Rolling Left;Sit to Supine;Supine to Sit Rolling Right: Supervision/verbal cueing Rolling Left: Supervision/Verbal cueing Supine to Sit: Supervision/Verbal cueing Sit to  Supine: Supervision/Verbal cueing Transfers Sit to Stand: Minimal Assistance - Patient > 75% Stand to Sit: Minimal Assistance - Patient > 75%  Trunk/Postural Assessment  Cervical Assessment Cervical Assessment: Within Functional Limits Thoracic Assessment Thoracic Assessment: Within Functional Limits Lumbar Assessment Lumbar Assessment: Within Functional Limits Postural Control Postural Control: Within Functional Limits  Balance Balance Balance Assessed: Yes Dynamic Sitting Balance Dynamic Sitting - Balance Support: Feet supported Dynamic Sitting - Level of Assistance: 6: Modified independent (Device/Increase time) Static Standing Balance Static Standing - Balance Support: Bilateral upper extremity supported Static Standing - Level of Assistance: 5: Stand by assistance Dynamic Standing Balance Dynamic Standing - Balance Support: Bilateral upper extremity supported;During functional activity Dynamic Standing - Level of Assistance: 5: Stand by assistance Dynamic Standing - Comments: CGA Extremity/Trunk Assessment RUE Assessment RUE Assessment: Within Functional Limits General Strength Comments: 5/5 LUE Assessment LUE Assessment: Within Functional Limits General Strength Comments: 5/5   D'mariea L Myrlene Riera 04/30/2024, 8:57 AM

## 2024-04-30 NOTE — Plan of Care (Signed)
  Problem: Sit to Stand Goal: LTG:  Patient will perform sit to stand with assistance level (PT) Description: LTG:  Patient will perform sit to stand with assistance level (PT) Outcome: Adequate for Discharge   Problem: RH Balance Goal: LTG Patient will maintain dynamic sitting balance (PT) Description: LTG:  Patient will maintain dynamic sitting balance with assistance during mobility activities (PT) Outcome: Completed/Met Goal: LTG Patient will maintain dynamic standing balance (PT) Description: LTG:  Patient will maintain dynamic standing balance with assistance during mobility activities (PT) Outcome: Completed/Met   Problem: RH Bed Mobility Goal: LTG Patient will perform bed mobility with assist (PT) Description: LTG: Patient will perform bed mobility with assistance, with/without cues (PT). Outcome: Completed/Met   Problem: RH Bed to Chair Transfers Goal: LTG Patient will perform bed/chair transfers w/assist (PT) Description: LTG: Patient will perform bed to chair transfers with assistance (PT). Outcome: Completed/Met   Problem: RH Car Transfers Goal: LTG Patient will perform car transfers with assist (PT) Description: LTG: Patient will perform car transfers with assistance (PT). Outcome: Completed/Met   Problem: RH Ambulation Goal: LTG Patient will ambulate in controlled environment (PT) Description: LTG: Patient will ambulate in a controlled environment, # of feet with assistance (PT). Outcome: Completed/Met Goal: LTG Patient will ambulate in home environment (PT) Description: LTG: Patient will ambulate in home environment, # of feet with assistance (PT). Outcome: Completed/Met

## 2024-04-30 NOTE — Progress Notes (Signed)
 Physical Therapy Discharge Summary  Patient Details  Name: Roger Pace MRN: [id number] Date of Birth: 1970/08/22  Date of Discharge from PT service:June 30, 2024  Today's Date: 04/30/2024 PT Individual Time: 0900-1014 PT Individual Time Calculation (min): 74 min    Patient has met 7 of 8 long term goals due to improved activity tolerance, improved balance, increased strength, increased range of motion, decreased pain, ability to compensate for deficits, improved attention, improved awareness, and improved coordination.  Patient to discharge at an ambulatory level CGA/supervision.   Patient's care partner is independent and requires assistance to provide the necessary physical assistance at discharge.  Reasons goals not met: pt requires CGA/ intermittent min A to stabilize RW for sit to stand.   Recommendation:  Patient will benefit from ongoing skilled PT services in home health setting to continue to advance safe functional mobility, address ongoing impairments in ROM/strength/balance/gait/endurance, and minimize fall risk.  Equipment: RW and BSC supplied in acute care   Reasons for discharge: treatment goals met and discharge from hospital  Patient/family agrees with progress made and goals achieved: Yes  PT Discharge Precautions/Restrictions Precautions Precautions: Fall;Knee Recall of Precautions/Restrictions: Intact Precaution/Restrictions Comments: Pt educated on need for knee extension at this time. Bed locked in extension, and education provided that NO pillow/roll/ice pack should be placed under the knee. Required Braces or Orthoses: Knee Immobilizer - Left;Knee Immobilizer - Right Knee Immobilizer - Right: Other (comment) (Dr. Addie, try to alternate knee immobilizer between legs to allow each leg the chance to bear more of the load when mobilizing. Pt needs to continue KI until he is able to complete 10 LAQ) Knee Immobilizer - Left: Other (comment) (Dr. Addie, try  to alternate knee immobilizer between legs to allow each leg the chance to bear more of the load when mobilizing. Pt needs to continue KI until he is able to complete 10 LAQ) Restrictions Weight Bearing Restrictions Per Provider Order: Yes RLE Weight Bearing Per Provider Order: Weight bearing as tolerated LLE Weight Bearing Per Provider Order: Weight bearing as tolerated Pain Interference Pain Interference Pain Effect on Sleep: 1. Rarely or not at all Pain Interference with Therapy Activities: 1. Rarely or not at all Pain Interference with Day-to-Day Activities: 1. Rarely or not at all Vision/Perception  Vision - History Ability to See in Adequate Light: 0 Adequate Perception Perception: Within Functional Limits Praxis Praxis: WFL  Cognition Overall Cognitive Status: Within Functional Limits for tasks assessed Arousal/Alertness: Awake/alert Orientation Level: Oriented X4 Memory: Appears intact Awareness: Appears intact Problem Solving: Appears intact Safety/Judgment: Appears intact Sensation Sensation Light Touch: Appears Intact Coordination Gross Motor Movements are Fluid and Coordinated: No Fine Motor Movements are Fluid and Coordinated: Yes Coordination and Movement Description: coordination impaired by strength/ROM deficits/pain; LLE impairements > [name] impairments Finger Nose Finger Test: [facility] Motor  Motor Motor: Other (comment) Motor - Skilled Clinical Observations: B TKA  Mobility Bed Mobility Bed Mobility: Rolling Right;Rolling Left;Sit to Supine;Supine to Sit Rolling Right: Supervision/verbal cueing Rolling Left: Supervision/Verbal cueing Supine to Sit: Supervision/Verbal cueing Sit to Supine: Supervision/Verbal cueing Transfers Transfers: Sit to Stand;Stand to Sit;Stand Pivot Transfers Sit to Stand: Contact Guard/Touching assist;Minimal Assistance - Patient > 75% Stand to Sit: Contact Guard/Touching assist;Minimal Assistance - Patient > 75% Stand Pivot  Transfers: Minimal Assistance - Patient > 75%;Contact Guard/Touching assist Stand Pivot Transfer Details: Verbal cues for sequencing;Verbal cues for precautions/safety;Verbal cues for safe use of DME/AE Transfer (Assistive device): Rolling walker Locomotion  Gait Ambulation: Yes Gait Assistance: Supervision/Verbal  cueing Gait Distance (Feet): 150 Feet Assistive device: Rolling walker Gait Assistance Details: Verbal cues for gait pattern;Verbal cues for sequencing;Visual cues for safe use of DME/AE;Verbal cues for precautions/safety Gait Gait: Yes Gait Pattern: Impaired Gait Pattern: Antalgic;Decreased hip/knee flexion - right;Step-through pattern;Trunk flexed Gait velocity: Decreased Stairs / Additional Locomotion Stairs: Yes Stairs Assistance: Contact Guard/Touching assist Stair Management Technique: Two rails Number of Stairs: 3 Height of Stairs: 3 Wheelchair Mobility Wheelchair Mobility: Yes Wheelchair Assistance: Independent with Scientist, research (life sciences): Both upper extremities Wheelchair Parts Management: Supervision/cueing Distance: 150+  Trunk/Postural Assessment  Cervical Assessment Cervical Assessment: Within Functional Limits Thoracic Assessment Thoracic Assessment: Within Functional Limits Lumbar Assessment Lumbar Assessment: Within Functional Limits Postural Control Postural Control: Within Functional Limits  Balance Balance Balance Assessed: Yes Dynamic Sitting Balance Dynamic Sitting - Balance Support: Feet supported Dynamic Sitting - Level of Assistance: 6: Modified independent (Device/Increase time) Static Standing Balance Static Standing - Balance Support: Bilateral upper extremity supported Static Standing - Level of Assistance: 5: Stand by assistance Dynamic Standing Balance Dynamic Standing - Balance Support: Bilateral upper extremity supported;During functional activity Dynamic Standing - Level of Assistance: 5: Stand by  assistance Dynamic Standing - Comments: supervision Extremity Assessment  RUE Assessment RUE Assessment: Within Functional Limits General Strength Comments: 5/5 LUE Assessment LUE Assessment: Within Functional Limits General Strength Comments: 5/5 RLE Assessment RLE Assessment: Exceptions to [facility] Passive Range of Motion (PROM) Comments: passive knee flexion ~90 deg, passive knee extension lacking ~5 deg Active Range of Motion (AROM) Comments: active knee extension lag -20 deg (seated) -5 deg standing, active knee flexion to ~80 deg General Strength Comments: grossly 2/5 with exception of ankle DF/PF 4/5. LLE Assessment LLE Assessment: Exceptions to [facility] Passive Range of Motion (PROM) Comments: passive knee extension lacking ~5 deg, passive knee flexion ~80 deg Active Range of Motion (AROM) Comments: active knee extension limited -20 deg (seated), -5 deg (standing) active knee flexion ~75  Today's Interventions   Pt received from OT. Pt agreeable to therapy. Pt denies any pain.   Reviewed HEP. Handout provided.   Access Code: [email or web address] URL: [email or web address] Date: 04/30/2024 Prepared by: Doreene Orris  Exercises - Supine Heel Slide with Strap  - 1 x daily - 7 x weekly - 3 sets - 10 reps - Seated Heel Slide  - 1 x daily - 7 x weekly - 3 sets - 10 reps - Supine Knee Extension Strengthening  - 1 x daily - 7 x weekly - 3 sets - 10 reps - Seated Long Arc Quad  - 1 x daily - 7 x weekly - 3 sets - 10 reps - Long Sitting Quad Set with Towel Roll Under Heel  - 1 x daily - 7 x weekly - 3 sets - 10 reps - Standing Quad Set  - 1 x daily - 7 x weekly - 3 sets - 10 reps  Pt wife for family training.   Education provided on pt impairments including ROM/strength deficits. Recommending supervision with ambulation with use of RW with pt alternative knee immobilizer between R and L LE. Emphasized pt need for CGA/min A for sit to stand with RW. Pt and pt wife returned  demonstration of stand pivot transfer, car transfer and gait.   Initiated community integration with outdoor ambulation with RW and supervision and transfer to outdoor bench with RW and min A.   Pt supine in bed with all needs within reach and bed alarm on.    [facility] [place], [place],  DPT  04/30/2024, 12:36 PM

## 2024-04-30 NOTE — Plan of Care (Signed)
  Problem: Sit to Stand Goal: LTG:  Patient will perform sit to stand in prep for activites of daily living with assistance level (OT) Description: LTG:  Patient will perform sit to stand in prep for activites of daily living with assistance level (OT) Outcome: Not Met (add Reason) Note: Min A from low surface SUP from elevated surface   Problem: RH Balance Goal: LTG: Patient will maintain dynamic sitting balance (OT) Description: LTG:  Patient will maintain dynamic sitting balance with assistance during activities of daily living (OT) Outcome: Completed/Met Goal: LTG Patient will maintain dynamic standing with ADLs (OT) Description: LTG:  Patient will maintain dynamic standing balance with assist during activities of daily living (OT)  Outcome: Completed/Met   Problem: RH Grooming Goal: LTG Patient will perform grooming w/assist,cues/equip (OT) Description: LTG: Patient will perform grooming with assist, with/without cues using equipment (OT) Outcome: Completed/Met   Problem: RH Bathing Goal: LTG Patient will bathe all body parts with assist levels (OT) Description: LTG: Patient will bathe all body parts with assist levels (OT) Outcome: Completed/Met   Problem: RH Dressing Goal: LTG Patient will perform upper body dressing (OT) Description: LTG Patient will perform upper body dressing with assist, with/without cues (OT). Outcome: Completed/Met Goal: LTG Patient will perform lower body dressing w/assist (OT) Description: LTG: Patient will perform lower body dressing with assist, with/without cues in positioning using equipment (OT) Outcome: Completed/Met   Problem: RH Toileting Goal: LTG Patient will perform toileting task (3/3 steps) with assistance level (OT) Description: LTG: Patient will perform toileting task (3/3 steps) with assistance level (OT)  Outcome: Completed/Met   Problem: RH Toilet Transfers Goal: LTG Patient will perform toilet transfers w/assist  (OT) Description: LTG: Patient will perform toilet transfers with assist, with/without cues using equipment (OT) Outcome: Completed/Met   Problem: RH Tub/Shower Transfers Goal: LTG Patient will perform tub/shower transfers w/assist (OT) Description: LTG: Patient will perform tub/shower transfers with assist, with/without cues using equipment (OT) Outcome: Completed/Met

## 2024-05-01 ENCOUNTER — Other Ambulatory Visit (HOSPITAL_COMMUNITY): Payer: Self-pay

## 2024-05-01 LAB — BASIC METABOLIC PANEL WITH GFR
Anion gap: 9 (ref 5–15)
BUN: 18 mg/dL (ref 6–20)
CO2: 25 mmol/L (ref 22–32)
Calcium: 8.5 mg/dL — ABNORMAL LOW (ref 8.9–10.3)
Chloride: 102 mmol/L (ref 98–111)
Creatinine, Ser: 0.92 mg/dL (ref 0.61–1.24)
GFR, Estimated: >60 mL/min (ref >60)
Glucose, Bld: 106 mg/dL — ABNORMAL HIGH (ref 70–99)
Potassium: 4.2 mmol/L (ref 3.5–5.1)
Sodium: 136 mmol/L (ref 135–145)

## 2024-05-01 MED ORDER — RIVAROXABAN 20 MG PO TABS
20.0000 mg | ORAL_TABLET | Freq: Every day | ORAL | 0 refills | Status: DC
  Filled 2024-05-01: qty 30, 30d supply, fill #0

## 2024-05-01 MED ORDER — DICLOFENAC SODIUM 1 % EX GEL
2.0000 g | Freq: Four times a day (QID) | CUTANEOUS | 0 refills | Status: AC
  Filled 2024-05-01: qty 300, 38d supply, fill #0

## 2024-05-01 MED ORDER — SODIUM CHLORIDE 1 G PO TABS
1.0000 g | ORAL_TABLET | Freq: Every day | ORAL | 0 refills | Status: AC
  Filled 2024-05-01: qty 15, 15d supply, fill #0

## 2024-05-01 MED ORDER — GABAPENTIN 100 MG PO CAPS
200.0000 mg | ORAL_CAPSULE | Freq: Three times a day (TID) | ORAL | 0 refills | Status: AC
  Filled 2024-05-01: qty 180, 30d supply, fill #0

## 2024-05-01 MED ORDER — RIVAROXABAN 15 MG PO TABS
15.0000 mg | ORAL_TABLET | Freq: Two times a day (BID) | ORAL | 0 refills | Status: AC
  Filled 2024-05-01: qty 13, 7d supply, fill #0

## 2024-05-01 MED ORDER — ACETAMINOPHEN 325 MG PO TABS: 325.0000 mg | ORAL_TABLET | ORAL | Status: AC | PRN

## 2024-05-01 MED ORDER — METHOCARBAMOL 750 MG PO TABS
750.0000 mg | ORAL_TABLET | Freq: Four times a day (QID) | ORAL | 0 refills | Status: AC
  Filled 2024-05-01: qty 120, 30d supply, fill #0

## 2024-05-01 MED ORDER — RIVAROXABAN 20 MG PO TABS
20.0000 mg | ORAL_TABLET | Freq: Every day | ORAL | 0 refills | Status: AC
  Filled 2024-05-01: qty 30, 30d supply, fill #0

## 2024-05-01 MED ORDER — OXYCODONE HCL 10 MG PO TABS
5.0000 mg | ORAL_TABLET | Freq: Four times a day (QID) | ORAL | 0 refills | Status: AC | PRN
  Filled 2024-05-01: qty 21, 7d supply, fill #0

## 2024-05-01 NOTE — Discharge Summary (Signed)
 Physician Discharge Summary  Patient ID: Roger Pace MRN: [id number] DOB/AGE: 54/02/1971 52 y.o.  Admit date: 04/22/2024 Discharge date: 05/01/2024  Discharge Diagnoses:  Principal Problem:   OA (osteoarthritis) of knee Active Problems:   Hyponatremia   ABLA (acute blood loss anemia)   Leukocytosis   Constipation   Acute pulmonary embolism (HCC)   Discharged Condition: stable  Significant Diagnostic Studies: DG Chest 2 View Result Date: 04/23/2024 CLINICAL DATA:  Fever. EXAM: CHEST - 2 VIEW COMPARISON:  Chest radiograph dated 04/16/2024. CTA chest dated 04/20/2024. FINDINGS: The heart size and mediastinal contours are unchanged. No focal consolidation, pleural effusion, or pneumothorax. No acute osseous abnormality. IMPRESSION: No acute cardiopulmonary findings. Electronically Signed   By: Harrietta Sherry M.D.   On: 04/23/2024 19:00   CT Angio Chest Pulmonary Embolism (PE) W or WO Contrast Addendum Date: 04/20/2024 ADDENDUM REPORT: 04/20/2024 16:12 ADDENDUM: Critical Value/emergent results were called by telephone at the time of interpretation on 04/20/2024 at 4:11 pm to provider COSTIN GHERGHE , who verbally acknowledged these results. Electronically Signed   By: Leita Birmingham M.D.   On: 04/20/2024 16:12   Result Date: 04/20/2024 CLINICAL DATA:  Pulmonary embolism suspected, high probability. EXAM: CT ANGIOGRAPHY CHEST WITH CONTRAST TECHNIQUE: Multidetector CT imaging of the chest was performed using the standard protocol during bolus administration of intravenous contrast. Multiplanar CT image reconstructions and MIPs were obtained to evaluate the vascular anatomy. RADIATION DOSE REDUCTION: This exam was performed according to the departmental dose-optimization program which includes automated exposure control, adjustment of the mA and/or kV according to patient size and/or use of iterative reconstruction technique. CONTRAST:  75mL OMNIPAQUE  IOHEXOL  350 MG/ML SOLN COMPARISON:  None  Available. FINDINGS: Cardiovascular: The heart is normal in size and there is no pericardial effusion. There is atherosclerotic calcification of the aorta without evidence of aneurysm. The pulmonary trunk is normal in caliber. Examination is limited due to mixing artifact and respiratory motion. Segmental and subsegmental pulmonary emboli are present in the right lower lobe. No definite evidence of right heart strain. Mediastinum/Nodes: No enlarged mediastinal, hilar, or axillary lymph nodes. Thyroid  gland, trachea, and esophagus demonstrate no significant findings. Lungs/Pleura: Mild atelectasis is noted bilaterally. No effusion or pneumothorax. Upper Abdomen: No acute abnormality. Musculoskeletal: No acute osseous abnormality. Review of the MIP images confirms the above findings. IMPRESSION: 1. Segmental and subsegmental pulmonary emboli in the right lower lobe. No evidence of right heart strain. 2. Aortic atherosclerosis. Electronically Signed: By: Leita Birmingham M.D. On: 04/20/2024 15:56   ECHOCARDIOGRAM COMPLETE Result Date: 04/19/2024    ECHOCARDIOGRAM REPORT   Patient Name:   Roger Pace Date of Exam: 04/19/2024 Medical Rec #:  [id number]         Height:       71.5 in Accession #:    [id number]        Weight:       260.0 lb Date of Birth:  1970/08/24         BSA:          2.370 m Patient Age:    52 years          BP:           137/63 mmHg Patient Gender: M                 HR:           117 bpm. Exam Location:  Inpatient Procedure: 2D Echo, Cardiac Doppler and Color Doppler (Both Spectral and  Color            Flow Doppler were utilized during procedure). Indications:    Abnormal ECG R94.31  History:        Patient has no prior history of Echocardiogram examinations.                 Arrythmias:Tachycardia.  Sonographer:    Thea Norlander RCS Referring Phys: [phone] NILDA HERO GHERGHE IMPRESSIONS  1. Left ventricular ejection fraction, by estimation, is 65 to 70%. The left ventricle has normal function. The  left ventricle has no regional wall motion abnormalities. Left ventricular diastolic parameters were normal.  2. Right ventricular systolic function is hyperdynamic. The right ventricular size is mildly enlarged.  3. The mitral valve is normal in structure. No evidence of mitral valve regurgitation. No evidence of mitral stenosis.  4. The aortic valve is tricuspid. Aortic valve regurgitation is not visualized. No aortic stenosis is present.  5. Aortic dilatation noted. There is mild dilatation of the aortic root, measuring 40 mm.  6. The inferior vena cava is normal in size with greater than 50% respiratory variability, suggesting right atrial pressure of 3 mmHg. Comparison(s): No prior Echocardiogram. FINDINGS  Left Ventricle: Left ventricular ejection fraction, by estimation, is 65 to 70%. The left ventricle has normal function. The left ventricle has no regional wall motion abnormalities. The left ventricular internal cavity size was normal in size. There is  no left ventricular hypertrophy. Left ventricular diastolic parameters were normal. Right Ventricle: The right ventricular size is mildly enlarged. No increase in right ventricular wall thickness. Right ventricular systolic function is hyperdynamic. Left Atrium: Left atrial size was normal in size. Right Atrium: Right atrial size was normal in size. Pericardium: There is no evidence of pericardial effusion. Mitral Valve: The mitral valve is normal in structure. Mild mitral annular calcification. No evidence of mitral valve regurgitation. No evidence of mitral valve stenosis. Tricuspid Valve: The tricuspid valve is normal in structure. Tricuspid valve regurgitation is not demonstrated. No evidence of tricuspid stenosis. Aortic Valve: The aortic valve is tricuspid. Aortic valve regurgitation is not visualized. No aortic stenosis is present. Aortic valve peak gradient measures 9.7 mmHg. Pulmonic Valve: The pulmonic valve was normal in structure. Pulmonic valve  regurgitation is not visualized. No evidence of pulmonic stenosis. Aorta: Aortic dilatation noted. There is mild dilatation of the aortic root, measuring 40 mm. Venous: The inferior vena cava is normal in size with greater than 50% respiratory variability, suggesting right atrial pressure of 3 mmHg. IAS/Shunts: The interatrial septum appears to be lipomatous. No atrial level shunt detected by color flow Doppler.  LEFT VENTRICLE PLAX 2D LVIDd:         4.40 cm   Diastology LVIDs:         2.80 cm   LV e' medial:    10.10 cm/s LV PW:         1.10 cm   LV E/e' medial:  7.8 LV IVS:        1.00 cm   LV e' lateral:   16.90 cm/s LVOT diam:     2.40 cm   LV E/e' lateral: 4.7 LV SV:         70 LV SV Index:   29 LVOT Area:     4.52 cm  LEFT ATRIUM           Index        RIGHT ATRIUM  Index LA diam:      3.50 cm 1.48 cm/m   RA Area:     10.90 cm LA Vol (A2C): 41.4 ml 17.47 ml/m  RA Volume:   17.30 ml  7.30 ml/m LA Vol (A4C): 36.7 ml 15.49 ml/m  AORTIC VALVE AV Area (Vmax): 3.48 cm AV Vmax:        156.00 cm/s AV Peak Grad:   9.7 mmHg LVOT Vmax:      120.00 cm/s LVOT Vmean:     74.600 cm/s LVOT VTI:       0.154 m  AORTA Ao Root diam: 4.00 cm Ao Asc diam:  3.60 cm MITRAL VALVE MV Area (PHT): 5.02 cm    SHUNTS MV Decel Time: 151 msec    Systemic VTI:  0.15 m MV E velocity: 78.60 cm/s  Systemic Diam: 2.40 cm MV A velocity: 95.40 cm/s MV E/A ratio:  0.82 Stanly Leavens MD Electronically signed by Stanly Leavens MD Signature Date/Time: 04/19/2024/2:25:06 PM    Final    DG CHEST PORT 1 VIEW Result Date: 04/16/2024 CLINICAL DATA:  Leukocytosis EXAM: PORTABLE CHEST 1 VIEW COMPARISON:  None Available. FINDINGS: The heart size and mediastinal contours are within normal limits. Both lungs are clear. The visualized skeletal structures are unremarkable. IMPRESSION: No active disease. Electronically Signed   By: Luke Bun M.D.   On: 04/16/2024 15:39   VAS US  LOWER EXTREMITY VENOUS (DVT) Result Date:  04/16/2024  Lower Venous DVT Study Patient Name:  SAVEON PLANT  Date of Exam:   04/16/2024 Medical Rec #: [id number]          Accession #:    [id number] Date of Birth: Sep 29, 1970          Patient Gender: M Patient Age:   67 years Exam Location:  [facility] Procedure:      VAS US  LOWER EXTREMITY VENOUS (DVT) Referring Phys: CARLIN CALIX --------------------------------------------------------------------------------  Indications: Swelling, and Edema.  Risk Factors: Surgery Bilat knee surgery 04/14/2024. Performing Technologist: Elmarie Lindau, RVT  Examination Guidelines: A complete evaluation includes B-mode imaging, spectral Doppler, color Doppler, and power Doppler as needed of all accessible portions of each vessel. Bilateral testing is considered an integral part of a complete examination. Limited examinations for reoccurring indications may be performed as noted. The reflux portion of the exam is performed with the patient in reverse Trendelenburg.  +---------+---------------+---------+-----------+----------+--------------+ RIGHT    CompressibilityPhasicitySpontaneityPropertiesThrombus Aging +---------+---------------+---------+-----------+----------+--------------+ CFV      Full           Yes      Yes                                 +---------+---------------+---------+-----------+----------+--------------+ SFJ      Full                                                        +---------+---------------+---------+-----------+----------+--------------+ FV Prox  Full                                                        +---------+---------------+---------+-----------+----------+--------------+ FV Mid  Full                                                        +---------+---------------+---------+-----------+----------+--------------+ FV DistalFull                                                         +---------+---------------+---------+-----------+----------+--------------+ PFV      Full                                                        +---------+---------------+---------+-----------+----------+--------------+ POP      Full           Yes      Yes                                 +---------+---------------+---------+-----------+----------+--------------+ PTV      Full                                                        +---------+---------------+---------+-----------+----------+--------------+ PERO     None                                         Acute          +---------+---------------+---------+-----------+----------+--------------+   +---------+---------------+---------+-----------+----------+--------------+ LEFT     CompressibilityPhasicitySpontaneityPropertiesThrombus Aging +---------+---------------+---------+-----------+----------+--------------+ CFV      Full           Yes      Yes                                 +---------+---------------+---------+-----------+----------+--------------+ SFJ      Full                                                        +---------+---------------+---------+-----------+----------+--------------+ FV Prox  Full                                                        +---------+---------------+---------+-----------+----------+--------------+ FV Mid   Full                                                        +---------+---------------+---------+-----------+----------+--------------+  FV DistalFull                                                        +---------+---------------+---------+-----------+----------+--------------+ PFV      Full                                                        +---------+---------------+---------+-----------+----------+--------------+ POP      Full           Yes      Yes                                  +---------+---------------+---------+-----------+----------+--------------+ PTV      None                                         Acute          +---------+---------------+---------+-----------+----------+--------------+ PERO     Full                                                        +---------+---------------+---------+-----------+----------+--------------+     *See table(s) above for measurements and observations. Electronically signed by Gaile New MD on 04/16/2024 at 12:29:37 PM.    Final     Labs:  Basic Metabolic Panel: Recent Labs  Lab 04/25/24 0433 04/27/24 0512 04/29/24 1459 05/01/24 0605  NA 134* 133* 133* 136  K 4.3 4.5 4.0 4.2  CL 101 101 101 102  CO2 20* 24 21* 25  GLUCOSE 115* 113* 116* 106*  BUN 15 15 20 18   CREATININE 1.14 1.03 1.14 0.92  CALCIUM  8.7* 8.8* 8.8* 8.5*    CBC: Recent Labs  Lab 04/26/24 0453 04/27/24 0512 04/29/24 1459  WBC 14.1* 11.8* 10.3  HGB 7.3* 7.7* 8.4*  HCT 22.7* 24.1* 26.4*  MCV 95.4 97.2 97.4  PLT 655* 724* 702*    CBG: No results for input(s): GLUCAP in the last 168 hours.  Brief HPI:   LUNDON VERDEJO is a 53 y.o. male with history abnormal LFTs, bilateral knee surgeries in the past with OA bilateral knees and failure of conservative management. He was admitted on 04/14/24 for B-TKR by Dr. Vernetta. Post course significant for fevers and intermittent tachycardia. BLE dopplers done for work up and positive for BLE DVT. He was started on Xarelto  but continued to have issues with orthostatic hypotension with fevers. 2 D echo done revealing EF 65-70% with mild dilatation of aortic root to 40 mm. CTA lungs done due to hyperdynamic RV and showed PE in RLL. Sinus tach felt to be compensatory to surgery and PE with DOAC to continue for 3-6 months. PT/OT was consulted and patient was requirieng    Hospital Course: ZHI GEIER was admitted to rehab 04/22/2024 for inpatient therapies to consist of PT and OT at least  three hours five days a week. Past admission physiatrist, therapy team and rehab RN have worked together to provide customized collaborative inpatient rehab.   Blood pressures were monitored on TID basis and   Diabetes has been monitored with ac/hs CBG checks and SSI was use prn for tighter BS control.    Rehab course: During patient's stay in rehab weekly team conferences were held to monitor patient's progress, set goals and discuss barriers to discharge. At admission, patient required  He/She  has had improvement in activity tolerance, balance, postural control as well as ability to compensate for deficits. He/She has had improvement in functional use RUE/LUE  and RLE/LLE as well as improvement in awareness       Disposition: Discharge disposition: 06-Home-Health Care Svc        Diet:  Special Instructions:   Allergies as of 05/01/2024       Reactions   Shellfish Allergy Hives        Medication List     STOP taking these medications    celecoxib  200 MG capsule Commonly known as: CELEBREX    docusate sodium  100 MG capsule Commonly known as: COLACE   POTASSIUM PO   Rivaroxaban  Starter Pack (15 mg and 20 mg) Commonly known as: XARELTO  STARTER PACK Replaced by: Rivaroxaban  15 MG Tabs tablet       TAKE these medications    acetaminophen  325 MG tablet Commonly known as: TYLENOL  Take 1-2 tablets (325-650 mg total) by mouth every 4 (four) hours as needed for mild pain (pain score 1-3). What changed:  when to take this reasons to take this   b complex vitamins capsule Take 1 capsule by mouth at bedtime.   COLLAGEN PO Take 1 capsule by mouth at bedtime.   diclofenac  Sodium 1 % Gel Commonly known as: VOLTAREN  Apply 2 g topically 4 (four) times daily.   gabapentin  100 MG capsule Commonly known as: NEURONTIN  Take 2 capsules (200 mg total) by mouth 3 (three) times daily.   MAGNESIUM  PO Take 1 capsule by mouth at bedtime.   methocarbamol  750 MG  tablet Commonly known as: ROBAXIN  Take 1 tablet (750 mg total) by mouth 4 (four) times daily. What changed:  medication strength how much to take when to take this reasons to take this   Oxycodone  HCl 10 MG Tabs Take 0.5-1 tablets (5-10 mg total) by mouth every 6 (six) hours as needed for severe pain (pain score 7-10). What changed:  medication strength when to take this reasons to take this Notes to patient: Need to wean down to 1/2 table twice a day as needed and wean off   Rivaroxaban  15 MG Tabs tablet Commonly known as: XARELTO  Take 1 tablet (15 mg total) by mouth 2 (two) times daily with a meal. Replaces: Rivaroxaban  Starter Pack (15 mg and 20 mg) Notes to patient: Use twice a day till gone then transition to 20 mg starting on SEPT 18th   rivaroxaban  20 MG Tabs tablet Commonly known as: XARELTO  Take 1 tablet (20 mg total) by mouth daily with supper. Start taking on: May 07, 2024 Notes to patient: DO NOT USE UNTIL 15 MG PILLS USED UP. THIS STARTS ON  SEPTEMBER 18TH!   sodium chloride  1 g tablet Take 1 tablet (1 g total) by mouth daily with lunch.   VITAMIN C PO Take 1 tablet by mouth at bedtime.   VITAMIN D -3 PO Take 1 tablet by mouth at bedtime.        Follow-up  Information     Hilts, Ozell, MD Follow up.   Specialty: Family Medicine Why: Call in 1-2 days for post hospital follow up Contact information: [address] [place]  [place] [address] [phone]         Urbano Albright, MD. Call.   Specialty: Physical Medicine and Rehabilitation Why: As needed Contact information: [address] [place] [address] [phone]         Vernetta Lonni GRADE, MD Follow up on 05/01/2024.   Specialty: Orthopedic Surgery Why: Call in 1-2 days for post hospital follow up Contact information: [address] [place]  [place] [address] [phone]                 Signed: Sharlet GORMAN Schmitz 05/01/2024, 10:01 AM

## 2024-05-01 NOTE — Progress Notes (Signed)
 PROGRESS NOTE   Subjective/Complaints: No new complaints or concerns this morning.  Patient looking forward to going home.  Has not had a bowel movement couple days  ROS: Patient denies recent fever or chills, new vision changes, dizziness, nausea, vomiting, constipation shortness of breath or chest pain, headache, or mood change.  Denies new motor or sensory changes. + Achilles pain right-chronic issue for him   Objective:   No results found.  Recent Labs    04/29/24 1459  WBC 10.3  HGB 8.4*  HCT 26.4*  PLT 702*   Recent Labs    04/29/24 1459 05/01/24 0605  NA 133* 136  K 4.0 4.2  CL 101 102  CO2 21* 25  GLUCOSE 116* 106*  BUN 20 18  CREATININE 1.14 0.92  CALCIUM  8.8* 8.5*    Intake/Output Summary (Last 24 hours) at 05/01/2024 1823 Last data filed at 05/01/2024 0732 Gross per 24 hour  Intake 240 ml  Output --  Net 240 ml        Physical Exam: Vital Signs Blood pressure 114/61, pulse 92, temperature 98.7 F (37.1 C), resp. rate 19, height 5' 11.5 (1.816 m), weight 118 kg, SpO2 100%.  Constitutional: No distress . Vital signs reviewed.  Sitting in bed appears comfortable HEENT: NCAT, EOMI, oral membranes moist Neck: supple Cardiovascular: RRR without murmur. No JVD    Respiratory/Chest: CTA Bilaterally without wheezes or rales. Normal effort    GI/Abdomen: BS +, non-tender, non-distended, soft Ext: no clubbing, cyanosis, or edema Psych: pleasant and cooperative  Skin: Clean and intact without signs of breakdown. Knee incisions appear intact with post-op dressings in place. Neuro:      Mental Status: AAOx3, good insight and awareness Speech/Languate: Follows commands CRANIAL NERVES: 2-12 grossly intact   MOTOR: RUE: 5/5 Deltoid, 5/5 Biceps, 5/5 Triceps,5/5 Grip LUE: 5/5 Deltoid, 5/5 Biceps, 5/5 Triceps, 5/5 Grip RLE: HF 4-/5, KE 4-/5, ADF 4/5, APF 4/5 LLE: HF 4-/5, KE 4-/5, ADF 4/5, APF 4/5    SENSORY: Normal to touch all 4 extremities  MSK: both knees somewhat sensitive to palpation Right Achilles tenderness palpation- unchanged Sitting in the gym-looks like knees at about 80-85 degrees of flexion Prior neuro assessment is c/w today's exam 05/01/2024.   Assessment/Plan: 1. Functional deficits which require 3+ hours per day of interdisciplinary therapy in a comprehensive inpatient rehab setting. Physiatrist is providing close team supervision and 24 hour management of active medical problems listed below. Physiatrist and rehab team continue to assess barriers to discharge/monitor patient progress toward functional and medical goals  Care Tool:  Bathing    Body parts bathed by patient: Right arm, Left arm, Chest, Abdomen, Front perineal area, Right upper leg, Left upper leg, Right lower leg, Left lower leg, Face, Buttocks   Body parts bathed by helper: Left lower leg, Right lower leg, Buttocks     Bathing assist Assist Level: Supervision/Verbal cueing     Upper Body Dressing/Undressing Upper body dressing        Upper body assist Assist Level: Independent with assistive device    Lower Body Dressing/Undressing Lower body dressing            Lower body  assist Assist for lower body dressing: Supervision/Verbal cueing     Toileting Toileting    Toileting assist Assist for toileting: Supervision/Verbal cueing     Transfers Chair/bed transfer  Transfers assist     Chair/bed transfer assist level: Supervision/Verbal cueing     Locomotion Ambulation   Ambulation assist      Assist level: Supervision/Verbal cueing Assistive device: Walker-rolling Max distance: 150+   Walk 10 feet activity   Assist     Assist level: Supervision/Verbal cueing Assistive device: Walker-rolling   Walk 50 feet activity   Assist Walk 50 feet with 2 turns activity did not occur: Safety/medical concerns (fatigue/pain)  Assist level: Supervision/Verbal  cueing Assistive device: Walker-rolling    Walk 150 feet activity   Assist Walk 150 feet activity did not occur: Safety/medical concerns  Assist level: Supervision/Verbal cueing Assistive device: Walker-rolling    Walk 10 feet on uneven surface  activity   Assist Walk 10 feet on uneven surfaces activity did not occur: Safety/medical concerns   Assist level: Supervision/Verbal cueing Assistive device: Walker-rolling   Wheelchair     Assist Is the patient using a wheelchair?: No Type of Wheelchair: Manual    Wheelchair assist level: Supervision/Verbal cueing Max wheelchair distance: 150    Wheelchair 50 feet with 2 turns activity    Assist        Assist Level: Supervision/Verbal cueing   Wheelchair 150 feet activity     Assist      Assist Level: Supervision/Verbal cueing   Blood pressure 114/61, pulse 92, temperature 98.7 F (37.1 C), resp. rate 19, height 5' 11.5 (1.816 m), weight 118 kg, SpO2 100%.  Medical Problem List and Plan: 1. Functional deficits secondary to B/L knee OA s/p b/l TKA complicated by acute PE             -patient may shower, cover incisions please             -ELOS/Goals: 12-15 days, PT/OT Mod I             -Discussed with Dr. Addie, appreciate assistance, pt to alternate knee immobilizers between legs, he can DC this when he can do 10 straight leg raises with each leg   -continue CPM  -Continue CIR therapies including PT, OT. Encouraged pt to push himself more on the CPM. He's only around 60 degrees currently   - Discharge home today  2.  Acute DVT/PE/Antithrombotics: -DVT/anticoagulation:  Pharmaceutical: Xarelto              -antiplatelet therapy: N/A 3. Pain Management: oxycodone  and robaxin  prn.  -appears controlled  - 9/9 right Achilles pain- Voltaren  and heat  -9/11 pain overall controlled, using oxycodone  about twice a day  Okay to continue occasional oxycodone  for pain, gradually wean down and wean off 4.  Mood/Behavior/Sleep: LCSW to follow for evaluation and support.              -antipsychotic agents: N/A 5. Neuropsych/cognition: This patient is capable of making decisions on his own behalf. 6. Skin/Wound Care: Routine pressure relief measures. --Surgical dressings intact on both knees.  -9/7 buttock tenderness. I didn't see any signs of breakdown or pressure. Will continue to monitor, encouraged up to chair as much as possible. Nursing to follow up 7. Fluids/Electrolytes/Nutrition: Monitor I/O. Check CMET in am   - 9/11 BUN/creatinine appear to be around his baseline, yesterday 20/1.14.  Encourage oral fluid intake 8. 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 Addie 9. BP/Orthostatic hypotension:  BP improving. Continue to monitor.  10.  Hyponatremia/Hypochloremia: Improving with improvement in intake? -9/4 hyponatremia improved to 134, potassium stable at 4.5 -9/8 sodium 133 today, potassium 4.5, continue to monitor trend -9/11 sodium remains a little low at 133, will add daily salt tab -9/12 improved to 136 recheck with PCP 11. ABLA: Recheck CBC in am. HGB 11.5 on 8/29  - 9/3 Hemoglobin down to 7.3, recheck today, FOBT.  Will recheck tomorrow to ensure it stable  -9/5 FOBT negative, hemoglobin a little improved.  Continue to monitor daily for now.  Xarelto  continued for now  9/7-sl dip this weekend (7.3), but he was at similar numbers mid last week   -continue to monitor serially  9/8 hemoglobin stable at 7.7 continue to follow  9/11 stable at 8.4 12.  Low grade fevers: T-max 100.3 yesterday evening.  --Continue to monitor for signs of infection. CXR and UA neg --BC X 2  08/30 -->negative/pending.  -9/3 WBC a little improved to 12.7 -9/4 recheck chest x-ray, lactic acid, procalcitonin, urinalysis, blood cultures -9/5 lactic acid 2.2 initially yesterday but improved to 1.6, had fluid bolus.  Blood cultures with no growth, urine does not indicate infection, chest x-ray without acute  changes 9/7 WBC's still 14k. UA negative. Wounds look ok. CXR NAD  -BCx NG x2d   -monitor only for now  9/8 WBC improved today to 11.8, not having significant fevers.  Continue to monitor  9/9 no significant fevers in the last 24 hours, recheck CBC tomorrow  9/11 fevers remain improved WBC down to 10.3  No further fevers 13. Constipation: Up all night after mag citrate.    -Continue Miralax  decrease to daily, senokot  -Dulcolax supp  PRN  -9/4 LBM today a little loose, decrease Senokot from twice daily to at bedtime -9/9 patient reports loose bowel movements, will discontinue Senokot and change MiraLAX  to as needed -9/11 discussed as needed laxatives available for use later today if does not have a bowel movement before then -9/12 advised taking laxatives as he has not had a recent bowel movement, patient declines a CT will take him when he gets home 15. Impaired fasting glucose:  Check AIc in am. A1C 4.9 16. Orthostatic hypotension  - Encourage fluid intake, order abdominal binder  -9/10 improved with TED hose  - 9/11 add salt tabs as above  -9/12 patient denies recent symptoms, continue current regimen      05/01/2024    5:59 AM 04/30/2024    8:47 PM 04/30/2024    1:39 PM  Vitals with BMI  Systolic 114 129 879  Diastolic 61 50 67  Pulse 92 101 99   17.  Transaminitis.  Has history of chronic mild elevation.  Continue to monitor  - 9/8 AST and ALT a little decreased today from last measurement  9/10 improved, continue follow-up with PCP 18.  Thrombocytosis.  Likely reactive.  Continue to monitor  -9/11 a little improved at 702    LOS: 9 days A FACE TO FACE EVALUATION WAS PERFORMED  Murray Collier 05/01/2024, 6:23 PM

## 2024-05-01 NOTE — Progress Notes (Addendum)
 Inpatient Rehabilitation Discharge Medication Review by a Pharmacist  A complete drug regimen review was completed for this patient to identify any potential clinically significant medication issues.   High Risk Drug Classes Is patient taking? Indication by Medication  Antipsychotic No    Anticoagulant Yes Rivaroxaban  for DVT  Antibiotic No    Opioid Yes Oxycodone  for severe pain   Antiplatelet No    Hypoglycemics/insulin No    Vasoactive Medication No    Chemotherapy No    Other Yes Acetaminophen , diclofenac  gel - pain management  Gabapentin  for nerve pain Methocarbamol  for muscle relaxing Sodium chloride  tabs, B complex, magnesium , vitamin C and D - supplements        Type of Medication Issue Identified Description of Issue Recommendation(s)  Drug Interaction(s) (clinically significant)        Duplicate Therapy        Allergy        No Medication Administration End Date    Clarification of Xarelto  dosing discussed with Sharlet Schmitz, PA.  Xarelto  15mg  BID ends with PM dose on 05/07/24 and patient should start the 20mg  daily dose on 05/08/24.   Sharlet Schmitz, PA will instruct patient on  these Xarelto  dosing instructions/dates.  Incorrect Dose        Additional Drug Therapy Needed        Significant med changes from prior encounter (inform family/care partners about these prior to discharge). Discontinued  PTA meds:  Potassium, docusate, and celecoxib .  NEW medications:  gabapentin , methocarbamol , rivaroxaban , diclofenac  gel, sodium chloride .  Communicate to patient /family/ caregiver prior to discharge.   Other            Clinically significant medication issues were identified that warrant physician communication and completion of prescribed/recommended actions by midnight of the next day:  No   Name of provider notified for urgent issues identified:    Provider Method of Notification:      Pharmacist comments:    Time spent performing this drug regimen review (minutes):   20     Levorn Gaskins, RPh Clinical Pharmacist 05/01/2024 8:43 AM

## 2024-05-01 NOTE — Plan of Care (Signed)
  Problem: Consults Goal: RH GENERAL PATIENT EDUCATION Description: See Patient Education module for education specifics. Outcome: Progressing   Problem: RH BOWEL ELIMINATION Goal: RH STG MANAGE BOWEL WITH ASSISTANCE Description: STG Manage Bowel with mod I Assistance. Outcome: Progressing   Problem: RH BLADDER ELIMINATION Goal: RH STG MANAGE BLADDER WITH ASSISTANCE Description: STG Manage Bladder With  mod I Assistance Outcome: Progressing   Problem: RH SKIN INTEGRITY Goal: RH STG SKIN FREE OF INFECTION/BREAKDOWN Description: Manage skin free of infection / breakdown with mod I assistance Outcome: Progressing   Problem: RH SAFETY Goal: RH STG ADHERE TO SAFETY PRECAUTIONS W/ASSISTANCE/DEVICE Description: STG Adhere to Safety Precautions With mod I  Assistance/Device. Outcome: Progressing   Problem: RH PAIN MANAGEMENT Goal: RH STG PAIN MANAGED AT OR BELOW PT'S PAIN GOAL Description: <4 w/ prns Outcome: Progressing   Problem: RH KNOWLEDGE DEFICIT GENERAL Goal: RH STG INCREASE KNOWLEDGE OF SELF CARE AFTER HOSPITALIZATION Description: Manage increase knowledge of self care after hospitalization with mod I assistance from wife using educational materials provided  Outcome: Progressing

## 2024-05-04 ENCOUNTER — Telehealth: Payer: Self-pay | Admitting: Orthopedic Surgery

## 2024-05-04 NOTE — Telephone Encounter (Signed)
 Roger Pace (PT) from [facility] called requesting verbals for 3wk 2. Roger Pace secure number is [phone].

## 2024-05-05 ENCOUNTER — Telehealth: Payer: Self-pay | Admitting: Orthopedic Surgery

## 2024-05-05 DIAGNOSIS — M25562 Pain in left knee: Secondary | ICD-10-CM

## 2024-05-05 NOTE — Telephone Encounter (Signed)
 Patient called and said he needs a referral to [facility]. Can you fax it to [phone]

## 2024-05-05 NOTE — Telephone Encounter (Signed)
 IC LMVM with verbal orders

## 2024-05-07 NOTE — Telephone Encounter (Signed)
Referral placed in chart. Called and advised pt

## 2024-05-07 NOTE — Addendum Note (Signed)
 Addended by: VANDERBILT LIONEL CROME on: 05/07/2024 04:12 PM   Modules accepted: Orders

## 2024-05-07 NOTE — Telephone Encounter (Signed)
 Yes  thx

## 2024-05-13 ENCOUNTER — Other Ambulatory Visit: Payer: Self-pay

## 2024-05-13 ENCOUNTER — Ambulatory Visit (INDEPENDENT_AMBULATORY_CARE_PROVIDER_SITE_OTHER): Admitting: Orthopedic Surgery

## 2024-05-13 DIAGNOSIS — Z96653 Presence of artificial knee joint, bilateral: Secondary | ICD-10-CM | POA: Diagnosis not present

## 2024-05-14 ENCOUNTER — Encounter: Admitting: Physician Assistant

## 2024-05-15 ENCOUNTER — Encounter: Payer: Self-pay | Admitting: Orthopedic Surgery

## 2024-05-15 NOTE — Progress Notes (Signed)
 Post-Op Visit Note   Patient: Roger Pace           Date of Birth: 1971/07/13           MRN: [id number] Visit Date: 05/13/2024 PCP: Hughie Sharper, MD   Assessment & Plan:  Chief Complaint:  Chief Complaint  Patient presents with   Right Knee - Follow-up    R TKA 04/14/2024   Left Knee - Follow-up    L TKA 04/14/2024   Visit Diagnoses:  1. S/P total knee arthroplasty, bilateral     Plan: Roger Pace is now about a month out bilateral total knee replacements.  Complicated by DVT and pulmonary embolism.  He is on anticoagulants.  Spent some time in rehab.  He has done some steps.  On exam right knee range of motion to 97 left knee range of motion to 87.  He is able to easily do straight leg raises.  CP machine is set on 92 degrees.  Energy level is returning.  His heart rate is also getting back down closer to normal.  Plan at this time is transition to outpatient physical therapy.  Fax and hardcopy provided.  He should come back in 4 weeks for clinical recheck.  I think his right knee looks good.  The left knee is lagging slightly in terms of full extension and flexion.  He is very motivated to get the legs back in shape.  Will see where he is at in 4 weeks.  Not taking much in terms of opioid pain medicine at this time.  Primarily taking Robaxin  and Tylenol .  Follow-Up Instructions: No follow-ups on file.   Orders:  Orders Placed This Encounter  Procedures   XR Knee 1-2 Views Right   XR Knee 1-2 Views Left   Ambulatory referral to Physical Therapy   No orders of the defined types were placed in this encounter.   Imaging: No results found.  PMFS History: Patient Active Problem List   Diagnosis Date Noted   OA (osteoarthritis) of knee 04/22/2024   Hyponatremia 04/22/2024   ABLA (acute blood loss anemia) 04/22/2024   Leukocytosis 04/22/2024   Constipation 04/22/2024   Acute pulmonary embolism (HCC) 04/22/2024   Arthritis of right knee 04/19/2024   Arthritis of left  knee 04/19/2024   DVT (deep venous thrombosis) (HCC) 04/16/2024   Tachycardia 04/16/2024   Fever 04/16/2024   S/P TKR (total knee replacement), bilateral 04/14/2024   Class 2 obesity without serious comorbidity with body mass index (BMI) of 38.0 to 38.9 in adult 12/22/2018   Elevated LFTs 12/22/2018   Erectile dysfunction 12/22/2018   Past Medical History:  Diagnosis Date   Abnormal LFTs    since teenager   Arthritis    H/O hyperglycemia    History of toe fracture 2018   R-4th MT Fx   History of umbilical hernia    Hypersomnia    Injury of left shoulder 2001    Family History  Problem Relation Age of Onset   Hypertension Mother    Irregular heart beat Mother    Osteoarthritis Mother        Knees   Diabetes Father    Hypertension Father    Other Father        Elevated LFT   Prostate cancer Father    Cancer Father    Leukemia Sister    Cancer Sister    Arrhythmia Brother    Hyperlipidemia Brother    Hypertension Brother  Prostate cancer Maternal Uncle    Cancer Maternal Uncle    Prostate cancer Paternal Uncle    Cancer Paternal Uncle     Past Surgical History:  Procedure Laterality Date   HARDWARE REMOVAL Right 04/14/2024   Procedure: REMOVAL, HARDWARE;  Surgeon: Addie Cordella Hamilton, MD;  Location: [facility] OR;  Service: Orthopedics;  Laterality: Right;   KNEE SURGERY Bilateral    arthroscopy on each knee, and reconstruction on right knee   LAPAROSCOPY N/A 09/18/2023   Procedure: DIAGNOSTIC LAPAROSCOPY;  Surgeon: Ebbie Cough, MD;  Location: [facility] OR;  Service: General;  Laterality: N/A;   SHOULDER SURGERY Left    repaired humerus (shoulder kept dislocating)   TOTAL KNEE ARTHROPLASTY Bilateral 04/14/2024   Procedure: ARTHROPLASTY, KNEE, BILATERAL, TOTAL;  Surgeon: Addie Cordella Hamilton, MD;  Location: [facility] OR;  Service: Orthopedics;  Laterality: Bilateral;   UMBILICAL HERNIA REPAIR N/A 09/18/2023   Procedure: UMBILICAL HERNIA REPAIR;  Surgeon: Ebbie Cough, MD;   Location: [facility] OR;  Service: General;  Laterality: N/A;  RNFA GEN & TAP BLOCK   Social History   Occupational History   Not on file  Tobacco Use   Smoking status: Never   Smokeless tobacco: Never  Vaping Use   Vaping status: Never Used  Substance and Sexual Activity   Alcohol use: No   Drug use: No   Sexual activity: Not on file

## 2024-05-18 ENCOUNTER — Encounter: Payer: Self-pay | Admitting: Orthopedic Surgery

## 2024-05-18 NOTE — Telephone Encounter (Signed)
 Okay for half days for 2 weeks then full days thereafter sedentary work only thanks

## 2024-06-10 ENCOUNTER — Ambulatory Visit (INDEPENDENT_AMBULATORY_CARE_PROVIDER_SITE_OTHER): Admitting: Orthopedic Surgery

## 2024-06-10 ENCOUNTER — Encounter: Payer: Self-pay | Admitting: Orthopedic Surgery

## 2024-06-10 DIAGNOSIS — Z96653 Presence of artificial knee joint, bilateral: Secondary | ICD-10-CM

## 2024-06-10 NOTE — Progress Notes (Signed)
 Post-Op Visit Note   Patient: Roger Pace           Date of Birth: 1971-01-12           MRN: [id number] Visit Date: 06/10/2024 PCP: Roger Sharper, MD   Assessment & Plan:  Chief Complaint:  Chief Complaint  Patient presents with   Other      Right Knee - Follow-up     R TKA 04/14/2024  Left Knee - Follow-up     L TKA 04/14/2024     Visit Diagnoses: No diagnosis found.  Plan: Roger Pace is now 2 months out bilateral total knee replacements.  He has been doing well.  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  Therapy is going well in [place].  Also doing home exercise program.  Has not done any stairs yet.  He is able to get all the way around on the bike.  On exam he has range of motion of 0-1 15 on the right 0-1 05 on the left.  Taking Tylenol  for pain.  Still on Xarelto .  Gait looks very good in terms of full extension.  Plan at this time is to continue with physical therapy 3 times a week for the next 4 to 8 weeks.  Follow-up in 2 months for final check.  Having a little bit of low back pain which I think may be related to core strengthening.  Will consider lumbar spine x-rays on return.  Follow-Up Instructions: No follow-ups on file.   Orders:  No orders of the defined types were placed in this encounter.  No orders of the defined types were placed in this encounter.   Imaging: No results found.  PMFS History: Patient Active Problem List   Diagnosis Date Noted   OA (osteoarthritis) of knee 04/22/2024   Hyponatremia 04/22/2024   ABLA (acute blood loss anemia) 04/22/2024   Leukocytosis 04/22/2024   Constipation 04/22/2024   Acute pulmonary embolism (HCC) 04/22/2024   Arthritis of right knee 04/19/2024   Arthritis of left knee 04/19/2024   DVT (deep venous thrombosis) (HCC) 04/16/2024   Tachycardia 04/16/2024   Fever 04/16/2024   S/P TKR (total knee replacement), bilateral 04/14/2024   Class 2 obesity without serious comorbidity with body mass  index (BMI) of 38.0 to 38.9 in adult 12/22/2018   Elevated LFTs 12/22/2018   Erectile dysfunction 12/22/2018   Past Medical History:  Diagnosis Date   Abnormal LFTs    since teenager   Arthritis    H/O hyperglycemia    History of toe fracture 2018   R-4th MT Fx   History of umbilical hernia    Hypersomnia    Injury of left shoulder 2001    Family History  Problem Relation Age of Onset   Hypertension Mother    Irregular heart beat Mother    Osteoarthritis Mother        Knees   Diabetes Father    Hypertension Father    Other Father        Elevated LFT   Prostate cancer Father    Cancer Father    Leukemia Sister    Cancer Sister    Arrhythmia Brother    Hyperlipidemia Brother    Hypertension Brother    Prostate cancer Maternal Uncle    Cancer Maternal Uncle    Prostate cancer Paternal Uncle    Cancer Paternal Uncle     Past Surgical History:  Procedure Laterality Date  HARDWARE REMOVAL Right 04/14/2024   Procedure: REMOVAL, HARDWARE;  Surgeon: Addie Cordella Hamilton, MD;  Location: [facility] OR;  Service: Orthopedics;  Laterality: Right;   KNEE SURGERY Bilateral    arthroscopy on each knee, and reconstruction on right knee   LAPAROSCOPY N/A 09/18/2023   Procedure: DIAGNOSTIC LAPAROSCOPY;  Surgeon: Ebbie Cough, MD;  Location: [facility] OR;  Service: General;  Laterality: N/A;   SHOULDER SURGERY Left    repaired humerus (shoulder kept dislocating)   TOTAL KNEE ARTHROPLASTY Bilateral 04/14/2024   Procedure: ARTHROPLASTY, KNEE, BILATERAL, TOTAL;  Surgeon: Addie Cordella Hamilton, MD;  Location: [facility] OR;  Service: Orthopedics;  Laterality: Bilateral;   UMBILICAL HERNIA REPAIR N/A 09/18/2023   Procedure: UMBILICAL HERNIA REPAIR;  Surgeon: Ebbie Cough, MD;  Location: [facility] OR;  Service: General;  Laterality: N/A;  RNFA GEN & TAP BLOCK   Social History   Occupational History   Not on file  Tobacco Use   Smoking status: Never   Smokeless tobacco: Never  Vaping Use   Vaping  status: Never Used  Substance and Sexual Activity   Alcohol use: No   Drug use: No   Sexual activity: Not on file

## 2024-06-22 ENCOUNTER — Encounter: Payer: Self-pay | Admitting: Radiology

## 2024-07-31 ENCOUNTER — Other Ambulatory Visit (HOSPITAL_COMMUNITY): Payer: Self-pay | Admitting: Family Medicine

## 2024-07-31 ENCOUNTER — Ambulatory Visit (HOSPITAL_COMMUNITY)
Admission: RE | Admit: 2024-07-31 | Discharge: 2024-07-31 | Disposition: A | Source: Ambulatory Visit | Attending: Surgery | Admitting: Surgery

## 2024-07-31 DIAGNOSIS — I824Y3 Acute embolism and thrombosis of unspecified deep veins of proximal lower extremity, bilateral: Secondary | ICD-10-CM

## 2024-08-19 ENCOUNTER — Ambulatory Visit: Admitting: Orthopedic Surgery

## 2024-08-19 ENCOUNTER — Encounter: Payer: Self-pay | Admitting: Orthopedic Surgery

## 2024-08-19 DIAGNOSIS — Z96653 Presence of artificial knee joint, bilateral: Secondary | ICD-10-CM

## 2024-08-19 NOTE — Progress Notes (Unsigned)
 "  Post-Op Visit Note   Patient: Roger Pace           Date of Birth: [birth year]           MRN: [id number] Visit Date: 08/19/2024 PCP: Hughie Sharper, MD   Assessment & Plan:  Chief Complaint:  Chief Complaint  Patient presents with   Right Knee - Follow-up    R TKA 04/14/2024   Left Knee - Follow-up    L TKA 04/14/2024   Visit Diagnoses:  1. S/P total knee arthroplasty, bilateral     Plan: [name] is now about 5 months out bilateral knee replacement.  Complicated by DVT.  And pulmonary embolism.  Overall he is making good progress.  Doing step ups and step downs in physical therapy about 2 days a week.  Has a little bit of soreness in the back of the right knee.  Has achieved range of motion of 0-1 24 bilaterally.  He is working on the bike as well.  Taking Tylenol  for pain.  Anticipates being off anticoagulants by the end of January.  Ultrasound exam demonstrates that his DVT has dissolved.  Plans to retire January 30.  Quad strength is improving.  Plan at this time is to transition to physical therapy 1 to 2 days/week for the next 6 to 8 weeks for strengthening.  Overall [name] has done a very good job of rehabilitating his knees.  He will follow-up with us  as needed.  Follow-Up Instructions: No follow-ups on file.   Orders:  No orders of the defined types were placed in this encounter.  No orders of the defined types were placed in this encounter.   Imaging: No results found.  PMFS History: Patient Active Problem List   Diagnosis Date Noted   OA (osteoarthritis) of knee 04/22/2024   Hyponatremia 04/22/2024   ABLA (acute blood loss anemia) 04/22/2024   Leukocytosis 04/22/2024   Constipation 04/22/2024   Acute pulmonary embolism (HCC) 04/22/2024   Arthritis of right knee 04/19/2024   Arthritis of left knee 04/19/2024   DVT (deep venous thrombosis) (HCC) 04/16/2024   Tachycardia 04/16/2024   Fever 04/16/2024   S/P TKR (total knee replacement), bilateral 04/14/2024    Class 2 obesity without serious comorbidity with body mass index (BMI) of 38.0 to 38.9 in adult 12/22/2018   Elevated LFTs 12/22/2018   Erectile dysfunction 12/22/2018   Past Medical History:  Diagnosis Date   Abnormal LFTs    since teenager   Arthritis    H/O hyperglycemia    History of toe fracture 2018   R-4th MT Fx   History of umbilical hernia    Hypersomnia    Injury of left shoulder 2001    Family History  Problem Relation Age of Onset   Hypertension Mother    Irregular heart beat Mother    Osteoarthritis Mother        Knees   Diabetes Father    Hypertension Father    Other Father        Elevated LFT   Prostate cancer Father    Cancer Father    Leukemia Sister    Cancer Sister    Arrhythmia Brother    Hyperlipidemia Brother    Hypertension Brother    Prostate cancer Maternal Uncle    Cancer Maternal Uncle    Prostate cancer Paternal Uncle    Cancer Paternal Uncle     Past Surgical History:  Procedure Laterality Date   HARDWARE REMOVAL Right 04/14/2024  Procedure: REMOVAL, HARDWARE;  Surgeon: Addie Cordella Hamilton, MD;  Location: [facility] OR;  Service: Orthopedics;  Laterality: Right;   KNEE SURGERY Bilateral    arthroscopy on each knee, and reconstruction on right knee   LAPAROSCOPY N/A 09/18/2023   Procedure: DIAGNOSTIC LAPAROSCOPY;  Surgeon: Ebbie Cough, MD;  Location: [facility] OR;  Service: General;  Laterality: N/A;   SHOULDER SURGERY Left    repaired humerus (shoulder kept dislocating)   TOTAL KNEE ARTHROPLASTY Bilateral 04/14/2024   Procedure: ARTHROPLASTY, KNEE, BILATERAL, TOTAL;  Surgeon: Addie Cordella Hamilton, MD;  Location: [facility] OR;  Service: Orthopedics;  Laterality: Bilateral;   UMBILICAL HERNIA REPAIR N/A 09/18/2023   Procedure: UMBILICAL HERNIA REPAIR;  Surgeon: Ebbie Cough, MD;  Location: [facility] OR;  Service: General;  Laterality: N/A;  RNFA GEN & TAP BLOCK   Social History   Occupational History   Not on file  Tobacco Use   Smoking status:  Never   Smokeless tobacco: Never  Vaping Use   Vaping status: Never Used  Substance and Sexual Activity   Alcohol use: No   Drug use: No   Sexual activity: Not on file     "
